# Patient Record
Sex: Female | Born: 1945 | Race: White | Hispanic: No | Marital: Married | Smoking: Never smoker
Health system: Southern US, Community
[De-identification: ages and names within clinical notes are randomized; demographics above are authoritative.]

## PROBLEM LIST (undated history)

## (undated) DIAGNOSIS — N200 Calculus of kidney: Secondary | ICD-10-CM

## (undated) DIAGNOSIS — G5600 Carpal tunnel syndrome, unspecified upper limb: Secondary | ICD-10-CM

## (undated) DIAGNOSIS — N281 Cyst of kidney, acquired: Secondary | ICD-10-CM

## (undated) DIAGNOSIS — K635 Polyp of colon: Secondary | ICD-10-CM

## (undated) DIAGNOSIS — I10 Essential (primary) hypertension: Secondary | ICD-10-CM

## (undated) HISTORY — DX: Essential (primary) hypertension: I10

## (undated) HISTORY — PX: UPPER GASTROINTESTINAL ENDOSCOPY: SHX188

## (undated) HISTORY — DX: Carpal tunnel syndrome, unspecified upper limb: G56.00

## (undated) HISTORY — DX: Calculus of kidney: N20.0

## (undated) HISTORY — DX: Polyp of colon: K63.5

## (undated) HISTORY — DX: Cyst of kidney, acquired: N28.1

## (undated) HISTORY — PX: KNEE ARTHROSCOPY: SHX127

---
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
## 1994-10-31 HISTORY — PX: HERNIA REPAIR: SHX51

## 2009-04-19 ENCOUNTER — Ambulatory Visit: Payer: Self-pay | Admitting: Family Medicine

## 2010-10-23 ENCOUNTER — Ambulatory Visit
Admission: RE | Admit: 2010-10-23 | Discharge: 2010-10-23 | Payer: Self-pay | Source: Home / Self Care | Admitting: Emergency Medicine

## 2010-12-02 NOTE — Assessment & Plan Note (Signed)
Summary: COLD/TM   Vital Signs:  Patient Profile:   65 Years Old Female CC:      productive cough, congestion x 4 days Height:     62 inches Weight:      163 pounds O2 Sat:      98 % O2 treatment:    Room Air Temp:     98.7 degrees F oral Pulse rate:   70 / minute Resp:     14 per minute BP sitting:   135 / 77  (left arm) Cuff size:   regular  Pt. in pain?   no  Vitals Entered By: Lajean Saver RN (October 23, 2010 11:36 AM)                   Updated Prior Medication List: * VITAMIN D once a day * CALCIUM as directed * AMBIEN as needed NEXIUM 20 MG CPDR (ESOMEPRAZOLE MAGNESIUM) prn  Current Allergies: No known allergies History of Present Illness History from: patient Chief Complaint: productive cough, congestion x 4 days History of Present Illness: 65 Years Old Female complains of onset of cold symptoms for 7 days.  Jabree has been using Deslym which is helping a little bit.  She is concerned because she has a breast reduction surgery planned on Jan 3rd and wants to be better by then.  Her husband has been sick for 10 days now.   No sore throat + cough No pleuritic pain No wheezing No nasal congestion No post-nasal drainage + sinus pain/pressure + hoarseness No chest congestion No itchy/red eyes No earache No hemoptysis No SOB No chills/sweats No fever No nausea No vomiting No abdominal pain No diarrhea No skin rashes No fatigue No myalgias No headache   REVIEW OF SYSTEMS Constitutional Symptoms      Denies fever, chills, night sweats, weight loss, weight gain, and fatigue.  Eyes       Denies change in vision, eye pain, eye discharge, glasses, contact lenses, and eye surgery. Ear/Nose/Throat/Mouth       Complains of frequent runny nose, sinus problems, and hoarseness.      Denies hearing loss/aids, change in hearing, ear pain, ear discharge, dizziness, frequent nose bleeds, sore throat, and tooth pain or bleeding.  Respiratory       Complains of  productive cough.      Denies dry cough, wheezing, shortness of breath, asthma, bronchitis, and emphysema/COPD.  Cardiovascular       Denies murmurs, chest pain, and tires easily with exhertion.    Gastrointestinal       Denies stomach pain, nausea/vomiting, diarrhea, constipation, blood in bowel movements, and indigestion. Genitourniary       Denies painful urination, kidney stones, and loss of urinary control. Neurological       Denies paralysis, seizures, and fainting/blackouts. Musculoskeletal       Denies muscle pain, joint pain, joint stiffness, decreased range of motion, redness, swelling, muscle weakness, and gout.  Skin       Denies bruising, unusual mles/lumps or sores, and hair/skin or nail changes.  Psych       Denies mood changes, temper/anger issues, anxiety/stress, speech problems, depression, and sleep problems. Other Comments: x 4 days   Past History:  Past Medical History: Reflux  Past Surgical History: tonsillectomy hernia repair tummy tuck Breast reduction scheduled for next week  Family History: Reviewed history from 04/19/2009 and no changes required. mother deceased age 32 - CHF father deceased age 79 - heart attack brother alive  and healthy sister alive and healthy  Social History: denies drinking, smoking or recreational drug use Married Physical Exam General appearance: well developed, well nourished, no acute distress Ears: normal, no lesions or deformities Nasal: clear discharge Oral/Pharynx: pharyngeal erythema without exudate, uvula midline without deviation Neck: neck supple,  trachea midline, no masses Chest/Lungs: no rales, wheezes, or rhonchi bilateral, breath sounds equal without effort Heart: regular rate and  rhythm, no murmur Skin: no obvious rashes or lesions MSE: oriented to time, place, and person Assessment New Problems: UPPER RESPIRATORY INFECTION, ACUTE (ICD-465.9)   Patient Education: Patient and/or caregiver  instructed in the following: rest, fluids.  Plan New Medications/Changes: TESSALON 200 MG CAPS (BENZONATATE) 1 by mouth three times a day as needed cough  #18 x 0, 10/23/2010, Hoyt Koch MD ZITHROMAX Z-PAK 250 MG TABS (AZITHROMYCIN) use as directed  #1 x 0, 10/23/2010, Hoyt Koch MD  New Orders: Est. Patient Level III [04540] Pulse Oximetry [94760] Planning Comments:   1)  Take the prescribed antibiotic as instructed.  Inform your surgeon that you are currently sick, but I would assume you should be feeling better within the week. 2)  Use nasal saline solution (over the counter) at least 3 times a day. 3)  Use over the counter decongestants like Zyrtec-D every 12 hours as needed to help with congestion. 4)  Can take tylenol every 6 hours or motrin every 8 hours for pain or fever. 5)  Follow up with your primary doctor  if no improvement in 5-7 days, sooner if increasing pain, fever, or new symptoms.    The patient and/or caregiver has been counseled thoroughly with regard to medications prescribed including dosage, schedule, interactions, rationale for use, and possible side effects and they verbalize understanding.  Diagnoses and expected course of recovery discussed and will return if not improved as expected or if the condition worsens. Patient and/or caregiver verbalized understanding.  Prescriptions: TESSALON 200 MG CAPS (BENZONATATE) 1 by mouth three times a day as needed cough  #18 x 0   Entered and Authorized by:   Hoyt Koch MD   Signed by:   Hoyt Koch MD on 10/23/2010   Method used:   Printed then faxed to ...       [facility]  [address] [phone]* (retail)       [address]       [place], [place]  [address]       Ph: [phone]       Fax: [phone]   RxID:   [id number] ZITHROMAX Z-PAK 250 MG TABS (AZITHROMYCIN) use as directed  #1 x 0   Entered and Authorized by:   Hoyt Koch MD   Signed by:   Hoyt Koch MD on 10/23/2010   Method  used:   Printed then faxed to ...       [facility]  [address] [phone]* (retail)       [address]       [place], [place]  [address]       Ph: [phone]       Fax: [phone]   RxID:   [id number]   Orders Added: 1)  Est. Patient Level III [55732] 2)  Pulse Oximetry [20254]

## 2011-11-01 HISTORY — PX: BREAST REDUCTION SURGERY: SHX8

## 2012-11-15 DIAGNOSIS — L253 Unspecified contact dermatitis due to other chemical products: Secondary | ICD-10-CM | POA: Insufficient documentation

## 2013-04-20 ENCOUNTER — Emergency Department (INDEPENDENT_AMBULATORY_CARE_PROVIDER_SITE_OTHER): Payer: Medicare Other

## 2013-04-20 ENCOUNTER — Encounter: Payer: Self-pay | Admitting: Emergency Medicine

## 2013-04-20 ENCOUNTER — Emergency Department (INDEPENDENT_AMBULATORY_CARE_PROVIDER_SITE_OTHER)
Admission: EM | Admit: 2013-04-20 | Discharge: 2013-04-20 | Disposition: A | Discharge status: Home / Self Care | Payer: Medicare Other | Source: Home / Self Care

## 2013-04-20 DIAGNOSIS — M25473 Effusion, unspecified ankle: Secondary | ICD-10-CM

## 2013-04-20 DIAGNOSIS — S93409A Sprain of unspecified ligament of unspecified ankle, initial encounter: Secondary | ICD-10-CM

## 2013-04-20 DIAGNOSIS — X500XXA Overexertion from strenuous movement or load, initial encounter: Secondary | ICD-10-CM

## 2013-04-20 DIAGNOSIS — M25579 Pain in unspecified ankle and joints of unspecified foot: Secondary | ICD-10-CM

## 2013-04-20 MED ORDER — MELOXICAM 15 MG PO TABS: 15.0000 mg | ORAL_TABLET | Freq: Every day | ORAL | Status: DC

## 2013-04-20 NOTE — ED Provider Notes (Signed)
History     CSN: [id number]  Arrival date & time 04/20/13  1317   None     Chief Complaint  Patient presents with  . Ankle Pain   HPI  Ankle pain x 1 day.  Pt reports rolling her ankle earlier today.  Has had R ankle pain and swelling since this point.  Had some mild transient numbness/tingling that self resolved.  Has been able to bare weight, albeit with pain.   History reviewed. No pertinent past medical history.  Past Surgical History  Procedure Laterality Date  . Breast surgery    . Hernia repair    . Tonsillectomy    . Abdominal surgery      Family History  Problem Relation Age of Onset  . Heart failure Mother   . Heart failure Father   . Diabetes Brother     History  Substance Use Topics  . Smoking status: Never Smoker   . Smokeless tobacco: Not on file  . Alcohol Use: No    OB History   Grav Para Term Preterm Abortions TAB SAB Ect Mult Living                  Review of Systems  All other systems reviewed and are negative.    Allergies  Review of patient's allergies indicates no known allergies.  Home Medications   Current Outpatient Rx  Name  Route  Sig  Dispense  Refill  . zolpidem (AMBIEN) 5 MG tablet   Oral   Take 5 mg by mouth at bedtime as needed for sleep.           BP 143/79  Pulse 69  Temp(Src) 98.2 F (36.8 C) (Oral)  Resp 16  Ht 5' 2.5" (1.588 m)  Wt 150 lb (68.04 kg)  BMI 26.98 kg/m2  SpO2 96%  Physical Exam  Constitutional: She appears well-developed and well-nourished.  HENT:  Head: Normocephalic and atraumatic.  Eyes: Conjunctivae are normal. Pupils are equal, round, and reactive to light.  Neck: Normal range of motion.  Cardiovascular: Normal rate and regular rhythm.   Abdominal: Soft.  Musculoskeletal:       Feet:  Neurological: She is alert.    ED Course  Procedures (including critical care time)  Labs Reviewed - No data to display Dg Ankle Complete Right  04/20/2013   *RADIOLOGY REPORT*   Clinical Data: Ankle pain  RIGHT ANKLE - COMPLETE 3+ VIEW  Comparison: None  Findings: Mild lateral soft tissue swelling identified.  No displaced fractures or dislocations identified no radio-opaque foreign bodies or soft tissue calcifications identified.  Small plantar are and posterior calcaneal heel spurs noted.  IMPRESSION:  1.  Soft tissue swelling.   Original Report Authenticated By: Signa Kell, M.D.     1. Ankle sprain and strain, right, initial encounter       MDM  Xray negative for fracture.  RICE and NSAIDs.  Ankle brace.  Discussed general care and MSK red flags.  Follow up with sports medicine if not improved.     The patient and/or caregiver has been counseled thoroughly with regard to treatment plan and/or medications prescribed including dosage, schedule, interactions, rationale for use, and possible side effects and they verbalize understanding. Diagnoses and expected course of recovery discussed and will return if not improved as expected or if the condition worsens. Patient and/or caregiver verbalized understanding.             Doree Albee, MD 04/20/13 1434

## 2013-04-20 NOTE — ED Notes (Signed)
Reports falling and hurting right ankle at 10:30am today; took Aleve, elevated it and used ice.

## 2013-10-15 DIAGNOSIS — Z8249 Family history of ischemic heart disease and other diseases of the circulatory system: Secondary | ICD-10-CM | POA: Insufficient documentation

## 2013-10-15 DIAGNOSIS — F329 Major depressive disorder, single episode, unspecified: Secondary | ICD-10-CM | POA: Insufficient documentation

## 2013-10-15 DIAGNOSIS — F32A Depression, unspecified: Secondary | ICD-10-CM | POA: Insufficient documentation

## 2013-10-31 HISTORY — PX: KIDNEY STONE SURGERY: SHX686

## 2014-01-15 DIAGNOSIS — Z85828 Personal history of other malignant neoplasm of skin: Secondary | ICD-10-CM | POA: Insufficient documentation

## 2014-11-14 DIAGNOSIS — Z86010 Personal history of colonic polyps: Secondary | ICD-10-CM | POA: Diagnosis not present

## 2014-11-14 DIAGNOSIS — K219 Gastro-esophageal reflux disease without esophagitis: Secondary | ICD-10-CM | POA: Diagnosis not present

## 2014-11-26 DIAGNOSIS — K219 Gastro-esophageal reflux disease without esophagitis: Secondary | ICD-10-CM | POA: Diagnosis not present

## 2014-11-26 DIAGNOSIS — D123 Benign neoplasm of transverse colon: Secondary | ICD-10-CM | POA: Diagnosis not present

## 2014-11-26 DIAGNOSIS — D125 Benign neoplasm of sigmoid colon: Secondary | ICD-10-CM | POA: Diagnosis not present

## 2014-11-26 DIAGNOSIS — Z86010 Personal history of colonic polyps: Secondary | ICD-10-CM | POA: Diagnosis not present

## 2014-11-26 LAB — HM COLONOSCOPY

## 2014-12-01 DIAGNOSIS — N200 Calculus of kidney: Secondary | ICD-10-CM | POA: Diagnosis not present

## 2014-12-01 DIAGNOSIS — R350 Frequency of micturition: Secondary | ICD-10-CM | POA: Diagnosis not present

## 2014-12-09 DIAGNOSIS — L578 Other skin changes due to chronic exposure to nonionizing radiation: Secondary | ICD-10-CM | POA: Diagnosis not present

## 2014-12-09 DIAGNOSIS — B079 Viral wart, unspecified: Secondary | ICD-10-CM | POA: Diagnosis not present

## 2014-12-09 DIAGNOSIS — D485 Neoplasm of uncertain behavior of skin: Secondary | ICD-10-CM | POA: Diagnosis not present

## 2014-12-09 DIAGNOSIS — L821 Other seborrheic keratosis: Secondary | ICD-10-CM | POA: Diagnosis not present

## 2014-12-09 DIAGNOSIS — L249 Irritant contact dermatitis, unspecified cause: Secondary | ICD-10-CM | POA: Diagnosis not present

## 2014-12-09 DIAGNOSIS — Z85828 Personal history of other malignant neoplasm of skin: Secondary | ICD-10-CM | POA: Diagnosis not present

## 2014-12-09 DIAGNOSIS — L309 Dermatitis, unspecified: Secondary | ICD-10-CM | POA: Diagnosis not present

## 2014-12-09 DIAGNOSIS — L570 Actinic keratosis: Secondary | ICD-10-CM | POA: Diagnosis not present

## 2015-01-02 DIAGNOSIS — Z1231 Encounter for screening mammogram for malignant neoplasm of breast: Secondary | ICD-10-CM | POA: Diagnosis not present

## 2015-01-13 DIAGNOSIS — L299 Pruritus, unspecified: Secondary | ICD-10-CM | POA: Diagnosis not present

## 2015-01-13 DIAGNOSIS — F419 Anxiety disorder, unspecified: Secondary | ICD-10-CM | POA: Diagnosis not present

## 2015-01-13 DIAGNOSIS — K219 Gastro-esophageal reflux disease without esophagitis: Secondary | ICD-10-CM | POA: Diagnosis not present

## 2015-01-29 DIAGNOSIS — E559 Vitamin D deficiency, unspecified: Secondary | ICD-10-CM | POA: Diagnosis not present

## 2015-01-29 DIAGNOSIS — Z79899 Other long term (current) drug therapy: Secondary | ICD-10-CM | POA: Diagnosis not present

## 2015-01-29 DIAGNOSIS — E785 Hyperlipidemia, unspecified: Secondary | ICD-10-CM | POA: Diagnosis not present

## 2015-02-02 DIAGNOSIS — Z0000 Encounter for general adult medical examination without abnormal findings: Secondary | ICD-10-CM | POA: Diagnosis not present

## 2015-02-02 DIAGNOSIS — F329 Major depressive disorder, single episode, unspecified: Secondary | ICD-10-CM | POA: Diagnosis not present

## 2015-02-02 DIAGNOSIS — E785 Hyperlipidemia, unspecified: Secondary | ICD-10-CM | POA: Diagnosis not present

## 2015-02-02 DIAGNOSIS — G5601 Carpal tunnel syndrome, right upper limb: Secondary | ICD-10-CM | POA: Diagnosis not present

## 2015-02-02 DIAGNOSIS — E559 Vitamin D deficiency, unspecified: Secondary | ICD-10-CM | POA: Diagnosis not present

## 2015-02-02 DIAGNOSIS — Z23 Encounter for immunization: Secondary | ICD-10-CM | POA: Diagnosis not present

## 2015-02-02 DIAGNOSIS — F5101 Primary insomnia: Secondary | ICD-10-CM | POA: Diagnosis not present

## 2015-02-11 DIAGNOSIS — M8580 Other specified disorders of bone density and structure, unspecified site: Secondary | ICD-10-CM | POA: Diagnosis not present

## 2015-02-11 DIAGNOSIS — Z13820 Encounter for screening for osteoporosis: Secondary | ICD-10-CM | POA: Diagnosis not present

## 2015-02-11 LAB — HM DEXA SCAN

## 2015-02-23 DIAGNOSIS — K219 Gastro-esophageal reflux disease without esophagitis: Secondary | ICD-10-CM | POA: Diagnosis not present

## 2015-02-23 DIAGNOSIS — Z86010 Personal history of colonic polyps: Secondary | ICD-10-CM | POA: Diagnosis not present

## 2015-06-10 DIAGNOSIS — M79641 Pain in right hand: Secondary | ICD-10-CM | POA: Diagnosis not present

## 2015-06-10 DIAGNOSIS — G5601 Carpal tunnel syndrome, right upper limb: Secondary | ICD-10-CM | POA: Diagnosis not present

## 2015-07-02 DIAGNOSIS — Z79899 Other long term (current) drug therapy: Secondary | ICD-10-CM | POA: Diagnosis not present

## 2015-07-02 DIAGNOSIS — K219 Gastro-esophageal reflux disease without esophagitis: Secondary | ICD-10-CM | POA: Diagnosis not present

## 2015-07-02 DIAGNOSIS — G5601 Carpal tunnel syndrome, right upper limb: Secondary | ICD-10-CM | POA: Diagnosis not present

## 2015-07-02 DIAGNOSIS — M65311 Trigger thumb, right thumb: Secondary | ICD-10-CM | POA: Diagnosis not present

## 2015-07-02 HISTORY — PX: TRIGGER FINGER RELEASE: SHX641

## 2015-07-15 DIAGNOSIS — F419 Anxiety disorder, unspecified: Secondary | ICD-10-CM

## 2015-07-15 DIAGNOSIS — K219 Gastro-esophageal reflux disease without esophagitis: Secondary | ICD-10-CM | POA: Diagnosis not present

## 2015-07-15 DIAGNOSIS — F329 Major depressive disorder, single episode, unspecified: Secondary | ICD-10-CM | POA: Diagnosis not present

## 2015-07-15 DIAGNOSIS — Z79899 Other long term (current) drug therapy: Secondary | ICD-10-CM | POA: Diagnosis not present

## 2015-07-15 DIAGNOSIS — Z23 Encounter for immunization: Secondary | ICD-10-CM | POA: Diagnosis not present

## 2015-07-15 DIAGNOSIS — F418 Other specified anxiety disorders: Secondary | ICD-10-CM | POA: Insufficient documentation

## 2015-07-15 DIAGNOSIS — F3341 Major depressive disorder, recurrent, in partial remission: Secondary | ICD-10-CM | POA: Insufficient documentation

## 2015-07-15 LAB — BASIC METABOLIC PANEL
BUN: 11 mg/dL (ref 4–21)
CREATININE: 0.7 mg/dL (ref 0.5–1.1)
GLUCOSE: 84 mg/dL
POTASSIUM: 4.1 mmol/L (ref 3.4–5.3)
Sodium: 141 mmol/L (ref 137–147)

## 2015-07-15 LAB — HEPATIC FUNCTION PANEL
ALT: 8 U/L (ref 7–35)
AST: 13 U/L (ref 13–35)
Alkaline Phosphatase: 40 U/L (ref 25–125)
Bilirubin, Total: 0.7 mg/dL

## 2015-08-14 DIAGNOSIS — L309 Dermatitis, unspecified: Secondary | ICD-10-CM | POA: Diagnosis not present

## 2015-08-14 DIAGNOSIS — Z483 Aftercare following surgery for neoplasm: Secondary | ICD-10-CM | POA: Diagnosis not present

## 2015-08-14 DIAGNOSIS — Z85828 Personal history of other malignant neoplasm of skin: Secondary | ICD-10-CM | POA: Diagnosis not present

## 2015-08-14 DIAGNOSIS — C44719 Basal cell carcinoma of skin of left lower limb, including hip: Secondary | ICD-10-CM | POA: Diagnosis not present

## 2015-08-14 DIAGNOSIS — L299 Pruritus, unspecified: Secondary | ICD-10-CM | POA: Diagnosis not present

## 2015-08-14 DIAGNOSIS — Z872 Personal history of diseases of the skin and subcutaneous tissue: Secondary | ICD-10-CM | POA: Diagnosis not present

## 2015-09-07 DIAGNOSIS — H43813 Vitreous degeneration, bilateral: Secondary | ICD-10-CM | POA: Diagnosis not present

## 2015-09-07 DIAGNOSIS — H527 Unspecified disorder of refraction: Secondary | ICD-10-CM | POA: Diagnosis not present

## 2015-09-07 DIAGNOSIS — H25813 Combined forms of age-related cataract, bilateral: Secondary | ICD-10-CM | POA: Diagnosis not present

## 2015-10-05 DIAGNOSIS — M9903 Segmental and somatic dysfunction of lumbar region: Secondary | ICD-10-CM | POA: Diagnosis not present

## 2015-10-05 DIAGNOSIS — J069 Acute upper respiratory infection, unspecified: Secondary | ICD-10-CM | POA: Diagnosis not present

## 2015-10-05 DIAGNOSIS — M5431 Sciatica, right side: Secondary | ICD-10-CM | POA: Diagnosis not present

## 2015-10-05 DIAGNOSIS — S39012A Strain of muscle, fascia and tendon of lower back, initial encounter: Secondary | ICD-10-CM | POA: Diagnosis not present

## 2015-10-05 DIAGNOSIS — M9904 Segmental and somatic dysfunction of sacral region: Secondary | ICD-10-CM | POA: Diagnosis not present

## 2015-10-06 DIAGNOSIS — M9903 Segmental and somatic dysfunction of lumbar region: Secondary | ICD-10-CM | POA: Diagnosis not present

## 2015-10-06 DIAGNOSIS — M9904 Segmental and somatic dysfunction of sacral region: Secondary | ICD-10-CM | POA: Diagnosis not present

## 2015-10-06 DIAGNOSIS — M5431 Sciatica, right side: Secondary | ICD-10-CM | POA: Diagnosis not present

## 2015-10-07 DIAGNOSIS — M9904 Segmental and somatic dysfunction of sacral region: Secondary | ICD-10-CM | POA: Diagnosis not present

## 2015-10-07 DIAGNOSIS — M5431 Sciatica, right side: Secondary | ICD-10-CM | POA: Diagnosis not present

## 2015-10-07 DIAGNOSIS — M9903 Segmental and somatic dysfunction of lumbar region: Secondary | ICD-10-CM | POA: Diagnosis not present

## 2015-10-08 DIAGNOSIS — M5431 Sciatica, right side: Secondary | ICD-10-CM | POA: Diagnosis not present

## 2015-10-08 DIAGNOSIS — M9904 Segmental and somatic dysfunction of sacral region: Secondary | ICD-10-CM | POA: Diagnosis not present

## 2015-10-08 DIAGNOSIS — M9903 Segmental and somatic dysfunction of lumbar region: Secondary | ICD-10-CM | POA: Diagnosis not present

## 2015-10-15 DIAGNOSIS — R262 Difficulty in walking, not elsewhere classified: Secondary | ICD-10-CM | POA: Diagnosis not present

## 2015-10-15 DIAGNOSIS — M6281 Muscle weakness (generalized): Secondary | ICD-10-CM | POA: Diagnosis not present

## 2015-10-15 DIAGNOSIS — M545 Low back pain: Secondary | ICD-10-CM | POA: Diagnosis not present

## 2015-10-19 DIAGNOSIS — M9903 Segmental and somatic dysfunction of lumbar region: Secondary | ICD-10-CM | POA: Diagnosis not present

## 2015-10-19 DIAGNOSIS — M5431 Sciatica, right side: Secondary | ICD-10-CM | POA: Diagnosis not present

## 2015-10-19 DIAGNOSIS — M9904 Segmental and somatic dysfunction of sacral region: Secondary | ICD-10-CM | POA: Diagnosis not present

## 2015-10-20 DIAGNOSIS — M9903 Segmental and somatic dysfunction of lumbar region: Secondary | ICD-10-CM | POA: Diagnosis not present

## 2015-10-20 DIAGNOSIS — M545 Low back pain: Secondary | ICD-10-CM | POA: Diagnosis not present

## 2015-10-20 DIAGNOSIS — M9904 Segmental and somatic dysfunction of sacral region: Secondary | ICD-10-CM | POA: Diagnosis not present

## 2015-10-20 DIAGNOSIS — M5431 Sciatica, right side: Secondary | ICD-10-CM | POA: Diagnosis not present

## 2015-10-20 DIAGNOSIS — M5416 Radiculopathy, lumbar region: Secondary | ICD-10-CM | POA: Diagnosis not present

## 2015-10-21 DIAGNOSIS — M5136 Other intervertebral disc degeneration, lumbar region: Secondary | ICD-10-CM | POA: Diagnosis not present

## 2015-10-21 DIAGNOSIS — M545 Low back pain: Secondary | ICD-10-CM | POA: Diagnosis not present

## 2015-10-21 DIAGNOSIS — M6281 Muscle weakness (generalized): Secondary | ICD-10-CM | POA: Diagnosis not present

## 2015-10-21 DIAGNOSIS — R262 Difficulty in walking, not elsewhere classified: Secondary | ICD-10-CM | POA: Diagnosis not present

## 2015-10-21 DIAGNOSIS — M47816 Spondylosis without myelopathy or radiculopathy, lumbar region: Secondary | ICD-10-CM | POA: Diagnosis not present

## 2015-10-22 DIAGNOSIS — M9903 Segmental and somatic dysfunction of lumbar region: Secondary | ICD-10-CM | POA: Diagnosis not present

## 2015-10-22 DIAGNOSIS — M5431 Sciatica, right side: Secondary | ICD-10-CM | POA: Diagnosis not present

## 2015-10-22 DIAGNOSIS — M9904 Segmental and somatic dysfunction of sacral region: Secondary | ICD-10-CM | POA: Diagnosis not present

## 2015-10-28 DIAGNOSIS — M6281 Muscle weakness (generalized): Secondary | ICD-10-CM | POA: Diagnosis not present

## 2015-10-28 DIAGNOSIS — R262 Difficulty in walking, not elsewhere classified: Secondary | ICD-10-CM | POA: Diagnosis not present

## 2015-10-28 DIAGNOSIS — M545 Low back pain: Secondary | ICD-10-CM | POA: Diagnosis not present

## 2015-10-29 DIAGNOSIS — M5416 Radiculopathy, lumbar region: Secondary | ICD-10-CM | POA: Diagnosis not present

## 2015-11-19 DIAGNOSIS — M5416 Radiculopathy, lumbar region: Secondary | ICD-10-CM | POA: Diagnosis not present

## 2015-11-19 DIAGNOSIS — M545 Low back pain: Secondary | ICD-10-CM | POA: Diagnosis not present

## 2015-12-07 DIAGNOSIS — R29898 Other symptoms and signs involving the musculoskeletal system: Secondary | ICD-10-CM | POA: Diagnosis not present

## 2015-12-07 DIAGNOSIS — M545 Low back pain: Secondary | ICD-10-CM | POA: Diagnosis not present

## 2015-12-07 DIAGNOSIS — R531 Weakness: Secondary | ICD-10-CM | POA: Diagnosis not present

## 2015-12-15 DIAGNOSIS — M545 Low back pain: Secondary | ICD-10-CM | POA: Diagnosis not present

## 2015-12-22 DIAGNOSIS — M5416 Radiculopathy, lumbar region: Secondary | ICD-10-CM | POA: Diagnosis not present

## 2015-12-22 DIAGNOSIS — M545 Low back pain: Secondary | ICD-10-CM | POA: Diagnosis not present

## 2015-12-24 DIAGNOSIS — M5416 Radiculopathy, lumbar region: Secondary | ICD-10-CM | POA: Diagnosis not present

## 2015-12-24 DIAGNOSIS — M545 Low back pain: Secondary | ICD-10-CM | POA: Diagnosis not present

## 2015-12-29 DIAGNOSIS — M5416 Radiculopathy, lumbar region: Secondary | ICD-10-CM | POA: Diagnosis not present

## 2015-12-29 DIAGNOSIS — M545 Low back pain: Secondary | ICD-10-CM | POA: Diagnosis not present

## 2015-12-31 DIAGNOSIS — H43811 Vitreous degeneration, right eye: Secondary | ICD-10-CM | POA: Diagnosis not present

## 2016-01-07 DIAGNOSIS — R29898 Other symptoms and signs involving the musculoskeletal system: Secondary | ICD-10-CM | POA: Diagnosis not present

## 2016-01-07 DIAGNOSIS — M5416 Radiculopathy, lumbar region: Secondary | ICD-10-CM | POA: Diagnosis not present

## 2016-01-26 DIAGNOSIS — R6889 Other general symptoms and signs: Secondary | ICD-10-CM | POA: Diagnosis not present

## 2016-02-02 DIAGNOSIS — L308 Other specified dermatitis: Secondary | ICD-10-CM | POA: Diagnosis not present

## 2016-02-02 DIAGNOSIS — Z0000 Encounter for general adult medical examination without abnormal findings: Secondary | ICD-10-CM | POA: Diagnosis not present

## 2016-02-02 DIAGNOSIS — L923 Foreign body granuloma of the skin and subcutaneous tissue: Secondary | ICD-10-CM | POA: Diagnosis not present

## 2016-02-02 DIAGNOSIS — Z8249 Family history of ischemic heart disease and other diseases of the circulatory system: Secondary | ICD-10-CM | POA: Diagnosis not present

## 2016-02-02 DIAGNOSIS — L570 Actinic keratosis: Secondary | ICD-10-CM | POA: Diagnosis not present

## 2016-02-02 DIAGNOSIS — L820 Inflamed seborrheic keratosis: Secondary | ICD-10-CM | POA: Diagnosis not present

## 2016-02-02 DIAGNOSIS — Z1231 Encounter for screening mammogram for malignant neoplasm of breast: Secondary | ICD-10-CM | POA: Diagnosis not present

## 2016-02-02 DIAGNOSIS — R5383 Other fatigue: Secondary | ICD-10-CM | POA: Diagnosis not present

## 2016-02-02 DIAGNOSIS — E538 Deficiency of other specified B group vitamins: Secondary | ICD-10-CM | POA: Diagnosis not present

## 2016-02-02 DIAGNOSIS — G479 Sleep disorder, unspecified: Secondary | ICD-10-CM | POA: Diagnosis not present

## 2016-02-02 DIAGNOSIS — Z85820 Personal history of malignant melanoma of skin: Secondary | ICD-10-CM | POA: Diagnosis not present

## 2016-02-02 DIAGNOSIS — L821 Other seborrheic keratosis: Secondary | ICD-10-CM | POA: Diagnosis not present

## 2016-02-02 DIAGNOSIS — D71 Functional disorders of polymorphonuclear neutrophils: Secondary | ICD-10-CM | POA: Diagnosis not present

## 2016-02-02 DIAGNOSIS — L601 Onycholysis: Secondary | ICD-10-CM | POA: Diagnosis not present

## 2016-02-02 DIAGNOSIS — E559 Vitamin D deficiency, unspecified: Secondary | ICD-10-CM | POA: Diagnosis not present

## 2016-02-02 LAB — TSH: TSH: 0.64 u[IU]/mL (ref 0.41–5.90)

## 2016-02-02 LAB — HEPATIC FUNCTION PANEL
ALK PHOS: 40 U/L (ref 25–125)
ALT: 8 U/L (ref 7–35)
AST: 13 U/L (ref 13–35)
BILIRUBIN, TOTAL: 0.7 mg/dL

## 2016-02-02 LAB — CBC AND DIFFERENTIAL
HCT: 38 % (ref 36–46)
HEMOGLOBIN: 13.1 g/dL (ref 12.0–16.0)
NEUTROS ABS: 3 /uL
PLATELETS: 217 10*3/uL (ref 150–399)
WBC: 6.2 10^3/mL

## 2016-02-02 LAB — BASIC METABOLIC PANEL
BUN: 11 mg/dL (ref 4–21)
CREATININE: 0.7 mg/dL (ref 0.5–1.1)
Glucose: 84 mg/dL
Potassium: 4.1 mmol/L (ref 3.4–5.3)
SODIUM: 141 mmol/L (ref 137–147)

## 2016-02-02 LAB — VITAMIN B12: VITAMIN B12: 387

## 2016-02-02 LAB — VITAMIN D 25 HYDROXY (VIT D DEFICIENCY, FRACTURES): Vit D, 25-Hydroxy: 27.6

## 2016-02-10 DIAGNOSIS — Z1231 Encounter for screening mammogram for malignant neoplasm of breast: Secondary | ICD-10-CM | POA: Diagnosis not present

## 2016-02-10 LAB — HM MAMMOGRAPHY

## 2016-02-12 DIAGNOSIS — N200 Calculus of kidney: Secondary | ICD-10-CM | POA: Diagnosis not present

## 2016-02-12 DIAGNOSIS — I719 Aortic aneurysm of unspecified site, without rupture: Secondary | ICD-10-CM | POA: Diagnosis not present

## 2016-02-12 DIAGNOSIS — Z8249 Family history of ischemic heart disease and other diseases of the circulatory system: Secondary | ICD-10-CM | POA: Diagnosis not present

## 2016-02-15 ENCOUNTER — Ambulatory Visit (INDEPENDENT_AMBULATORY_CARE_PROVIDER_SITE_OTHER): Payer: Medicare Other | Admitting: Family Medicine

## 2016-02-15 ENCOUNTER — Encounter: Payer: Self-pay | Admitting: Family Medicine

## 2016-02-15 VITALS — BP 130/80 | HR 68 | Wt 174.0 lb

## 2016-02-15 DIAGNOSIS — M25561 Pain in right knee: Secondary | ICD-10-CM

## 2016-02-15 DIAGNOSIS — K219 Gastro-esophageal reflux disease without esophagitis: Secondary | ICD-10-CM | POA: Insufficient documentation

## 2016-02-15 DIAGNOSIS — N281 Cyst of kidney, acquired: Secondary | ICD-10-CM

## 2016-02-15 DIAGNOSIS — E559 Vitamin D deficiency, unspecified: Secondary | ICD-10-CM | POA: Diagnosis not present

## 2016-02-15 DIAGNOSIS — M5432 Sciatica, left side: Secondary | ICD-10-CM

## 2016-02-15 DIAGNOSIS — M545 Low back pain: Secondary | ICD-10-CM | POA: Diagnosis not present

## 2016-02-15 DIAGNOSIS — M7062 Trochanteric bursitis, left hip: Secondary | ICD-10-CM

## 2016-02-15 DIAGNOSIS — M7061 Trochanteric bursitis, right hip: Secondary | ICD-10-CM

## 2016-02-15 DIAGNOSIS — N952 Postmenopausal atrophic vaginitis: Secondary | ICD-10-CM | POA: Insufficient documentation

## 2016-02-15 DIAGNOSIS — Q6100 Congenital renal cyst, unspecified: Secondary | ICD-10-CM | POA: Diagnosis not present

## 2016-02-15 DIAGNOSIS — M25562 Pain in left knee: Secondary | ICD-10-CM | POA: Diagnosis not present

## 2016-02-15 DIAGNOSIS — R29898 Other symptoms and signs involving the musculoskeletal system: Secondary | ICD-10-CM | POA: Diagnosis not present

## 2016-02-15 DIAGNOSIS — G2581 Restless legs syndrome: Secondary | ICD-10-CM | POA: Insufficient documentation

## 2016-02-15 MED ORDER — ALPRAZOLAM 0.5 MG PO TABS: 0.2500 mg | ORAL_TABLET | Freq: Every day | ORAL | Status: DC | PRN

## 2016-02-15 NOTE — Progress Notes (Signed)
Subjective:    Patient ID: Jasmine Fitzgerald, female    DOB: 12/17/1945, 70 y.o.   MRN: [id number]  HPI She is here today to establish care. She is a family member of one of my former patients, Brandon Melnick.  She was seeing Dr. Graylon Good for awhile.  She had a medicare wellness Exam in the last 2 weeks.  She c/o of pain in both her hips and restless leg.   She's had bilateral hip pain for several months. In fact she had an injection in her left hip about 2 or 3 years ago for bursitis and she feels like it's the same symptoms she had previously. It's painful to sleep on her side. She also notices that if she gets out of the car after sitting for long period of time they feel sore and stiff. She denies any trauma or injury. She has recently been doing physical therapy for sciatica down her left leg. She was seen about a month ago and had an MRI and was referred to PT. She did have an injection done as well back in December and then again in January for her lumbar spine. I will need to get her records for further detail.  Insomnia-she's producing on trazodone 50 mg at bedtime as needed but also takes a half a Xanax as well. She recently started on gabapentin for the sciatica in because of its sedative effects she's actually been skipping her trazodone. She still doesn't sleep well most nights and feels like she is restless.  She also brought in a copy of blood work that was done at her wellness exam about 6 months ago and then some repeat labs about 2 weeks ago. Her vitamin D was low. She was started on a daily supplement of 5000 international units. Next  She also reports a history of a kidney cyst is followed by Dr. Kathyrn Lass over the last 5 years. In fact she was released this past year since it had not changed.    Review of Systems  Constitutional: Negative for fever, diaphoresis and unexpected weight change.  HENT: Negative for hearing loss, rhinorrhea and tinnitus.   Eyes: Positive for visual  disturbance.  Respiratory: Negative for cough and wheezing.   Cardiovascular: Negative for chest pain and palpitations.  Gastrointestinal: Negative for nausea, vomiting, diarrhea and blood in stool.  Genitourinary: Negative for vaginal bleeding, vaginal discharge and difficulty urinating.  Musculoskeletal: Positive for myalgias. Negative for arthralgias.  Skin: Negative for rash.  Neurological: Negative for headaches.  Hematological: Negative for adenopathy. Does not bruise/bleed easily.  Psychiatric/Behavioral: Negative for sleep disturbance and dysphoric mood. The patient is not nervous/anxious.      BP 130/80 mmHg  Pulse 68  Wt 174 lb (78.926 kg)  SpO2 96%    Allergies  Allergen Reactions  . Codeine Nausea And Vomiting and Other (See Comments)    GI Upset  . Hydrocodone Nausea And Vomiting    Past Medical History  Diagnosis Date  . Renal cyst     benign  . Kidney stones   . Carpal tunnel syndrome     Past Surgical History  Procedure Laterality Date  . Breast reduction surgery  2013  . Hernia repair  1996  . Tonsillectomy  1957  . Abdominal surgery    . Trigger finger release  07/2015  . Knee surgery    . Kidney stone surgery  2015    Social History   Social History  . Marital Status: Married  Spouse Name: N/A  . Number of Children: N/A  . Years of Education: N/A   Occupational History  . Not on file.   Social History Main Topics  . Smoking status: Never Smoker   . Smokeless tobacco: Not on file  . Alcohol Use: No  . Drug Use: No  . Sexual Activity: Not Currently   Other Topics Concern  . Not on file   Social History Narrative   Retired. High school degree. Married to [place]. 3 adult children. Currently lives with her spouse.    Family History  Problem Relation Age of Onset  . Heart failure Mother   . Heart failure Father   . Diabetes Brother   . Cancer Maternal Aunt   . Cancer Maternal Uncle   . Ovarian cancer    . Lung cancer    .  Throat cancer    . Stroke Maternal Grandmother   . Heart attack Father     Outpatient Encounter Prescriptions as of 02/15/2016  Medication Sig  . ALPRAZolam (XANAX) 0.5 MG tablet Take 0.5 tablets (0.25 mg total) by mouth daily as needed for anxiety.  . Cholecalciferol (VITAMIN D3) 5000 units CAPS Take 1 capsule by mouth daily.  Marland Kitchen desvenlafaxine (PRISTIQ) 50 MG 24 hr tablet   . gabapentin (NEURONTIN) 300 MG capsule Take 300 mg by mouth at bedtime.  Marland Kitchen loratadine (CLARITIN) 10 MG tablet Take 10 mg by mouth daily.  . meloxicam (MOBIC) 15 MG tablet Take 1 tablet (15 mg total) by mouth daily.  Marland Kitchen omeprazole (PRILOSEC) 20 MG capsule   . traZODone (DESYREL) 50 MG tablet Take 50 mg by mouth at bedtime as needed for sleep.  Marland Kitchen VITAMIN K PO Take 1 capsule by mouth daily.  . [DISCONTINUED] ALPRAZolam (XANAX) 0.5 MG tablet   . [DISCONTINUED] zolpidem (AMBIEN) 5 MG tablet Take 5 mg by mouth at bedtime as needed for sleep.   No facility-administered encounter medications on file as of 02/15/2016.          Objective:   Physical Exam  Constitutional: She is oriented to person, place, and time. She appears well-developed and well-nourished.  HENT:  Head: Normocephalic and atraumatic.  Cardiovascular: Normal rate, regular rhythm and normal heart sounds.   Pulmonary/Chest: Effort normal and breath sounds normal.  Neurological: She is alert and oriented to person, place, and time.  Skin: Skin is warm and dry.  Psychiatric: She has a normal mood and affect. Her behavior is normal.          Assessment & Plan:  Insomnia - Repeat Joneen Boers refill her alprazolam for now. Will address at future visit again.  Bilateral bursitis-discussed treatment options including injections and physical therapy. She preferred to do bilateral injections today and also start some home physical therapy. Patient tolerated procedure well. Next  Vitamin D deficiency-we'll recheck her vitamin D level in about 3 months.  Next  Renal cyst-she wasn't sure if it was right or left kidney. But will update this as soon as I get her records. Released.    Sicatica, right - doing PT and on gabapentin.    Depression/anxiety-PHQ 9 score of 8 today and dad 7 score of 5. She rates her symptoms as not difficult. Continue with Pristiq. Continue with as needed alprazolam.  Aspiration/Injection Procedure Note MALAIA COLEE [id number] [birth year]  Procedure: Injection Indications: pain  Of bilateral trochanteric bursa  Procedure Details Consent: Risks of procedure as well as the alternatives and risks of each were explained to  the (patient/caregiver).  Consent for procedure obtained. Time Out: Verified patient identification, verified procedure, site/side was marked, verified correct patient position, special equipment/implants available, medications/allergies/relevent history reviewed, required imaging and test results available.  Performed    Area prepped with Hibiclens.   Local Anesthesia Used:Ethyl Chloride Spray Amount of Fluid Aspirated: none Injected 1 ml 40mg  kenalog with 9 cc of lidocaine 1% without epi.   A sterile dressing was applied.  Patient did tolerate procedure well. Estimated blood loss: 0  METHENEY,CATHERINE 02/15/2016, 1:56 PM

## 2016-02-18 ENCOUNTER — Encounter: Payer: Self-pay | Admitting: Family Medicine

## 2016-02-26 ENCOUNTER — Encounter: Payer: Self-pay | Admitting: Family Medicine

## 2016-03-11 ENCOUNTER — Encounter: Payer: Self-pay | Admitting: Family Medicine

## 2016-03-23 DIAGNOSIS — K219 Gastro-esophageal reflux disease without esophagitis: Secondary | ICD-10-CM | POA: Diagnosis not present

## 2016-03-23 DIAGNOSIS — Z86010 Personal history of colonic polyps: Secondary | ICD-10-CM | POA: Diagnosis not present

## 2016-03-24 ENCOUNTER — Encounter: Payer: Self-pay | Admitting: Family Medicine

## 2016-04-06 DIAGNOSIS — H43811 Vitreous degeneration, right eye: Secondary | ICD-10-CM | POA: Diagnosis not present

## 2016-04-06 LAB — HM DIABETES EYE EXAM

## 2016-04-22 ENCOUNTER — Encounter: Payer: Self-pay | Admitting: Family Medicine

## 2016-05-02 DIAGNOSIS — M5416 Radiculopathy, lumbar region: Secondary | ICD-10-CM | POA: Diagnosis not present

## 2016-05-10 DIAGNOSIS — M545 Low back pain: Secondary | ICD-10-CM | POA: Diagnosis not present

## 2016-05-10 DIAGNOSIS — M5416 Radiculopathy, lumbar region: Secondary | ICD-10-CM | POA: Diagnosis not present

## 2016-05-20 ENCOUNTER — Ambulatory Visit: Payer: Medicare Other | Admitting: Family Medicine

## 2016-05-24 ENCOUNTER — Ambulatory Visit (INDEPENDENT_AMBULATORY_CARE_PROVIDER_SITE_OTHER): Payer: Medicare Other

## 2016-05-24 ENCOUNTER — Encounter: Payer: Self-pay | Admitting: Family Medicine

## 2016-05-24 ENCOUNTER — Ambulatory Visit (INDEPENDENT_AMBULATORY_CARE_PROVIDER_SITE_OTHER): Payer: Medicare Other | Admitting: Family Medicine

## 2016-05-24 DIAGNOSIS — M5136 Other intervertebral disc degeneration, lumbar region: Secondary | ICD-10-CM | POA: Diagnosis not present

## 2016-05-24 DIAGNOSIS — M545 Low back pain: Secondary | ICD-10-CM

## 2016-05-24 DIAGNOSIS — M5416 Radiculopathy, lumbar region: Secondary | ICD-10-CM | POA: Diagnosis not present

## 2016-05-24 NOTE — Progress Notes (Signed)
Subjective:    CC: Back pain  HPI:  Back Pain she has been seen by Dr. Sherley Bounds, an orthopedist and had several injections. she wonders if she should be referred to neurology due to the type of pain that she is having. she has a f/u appt with Dr. Sherley Bounds in 2 wks     Patient comes in today just wanting some advice on what to do with her back. She said she started having some soreness in her back around the week of Thanksgiving last year. This was approximately 9 months ago. She then started to notice that the pain was actually radiating down to her left leg. It would get worse a with sitting and then would get better after getting up and walking for a little while. Her pain persisted so she finally contacted her provider at [facility]. They gave her a shot of prednisone a prescription for prednisone. This did not provide relief. She then decided to seek care from a chiropractor and went several visits without any relief. Then the week before Christmas she decided to see an orthopedist and saw Dr. Jacinto Reap AI at [facility] [place] in [place]. They then did an MRI on December 21. She received an injection on December 28 and got some relief. She then followed up and they recommended a repeat injection on January 19 and she got fantastic relief with that one. She also did physical therapy and was doing really well until about the third week of May when she actually fell and landed on her tailbone. She did not have any significant bruising at the time but did have more increased pain in the left buttock cheek area. It was painful to stand. She caught her orthopedist office back and they recommended a third injection that was done on July 3. Unfortunately she did not get any significant relief with this injection. She has a follow-up with him next week. They've tried gabapentin up to 4 pills a day as well as Cymbalta. She is not interested in narcotics for pain relief. She most of her pain is worse in the  morning when she first gets out of bed. In fact she'll have to sit up in a chair for almost an hour with her left leg propped up before she is able to move around and do things. She is now trying acupuncture and has tried it 3 times. She still continues to have pain in the left leg. When she puts pressure on the back of the left mid thigh, for example the edge of a chair. She will get pain that radiates around to the lateral portion of the lower leg and down to the outer part of the foot.  Past medical history, Surgical history, Family history not pertinant except as noted below, Social history, Allergies, and medications have been entered into the medical record, reviewed, and corrections made.   Review of Systems: No fevers, chills, night sweats, weight loss, chest pain, or shortness of breath.   Objective:    General: Well Developed, well nourished, and in no acute distress.  Neuro: Alert and oriented x3, extra-ocular muscles intact, sensation grossly intact.  HEENT: Normocephalic, atraumatic  Skin: Warm and dry, no rashes. Left leg: She does have a tender area over the left lateral thigh.   Impression and Recommendations:   Low back pain with left radiculopathy-I think at this point it would be helpful to get records to see where the injections were actually performed. She got significant  relief with the second injection so be interested to see at what level this was performed. She also did really well with physical therapy so strongly recommended getting her back into PT. She bought a TENS unit but just doesn't know how to use it so certainly she could take that with her to physical therapy and they can help her learn how to use it appropriately at think this would help provide some pain relief as well. It also might be worth getting a second opinion from either sports medicine or orthopedics depending on how she does after her physical therapy course. I don't think she is a candidate for any  surgical intervention at this point as the MRI in December did not show any nerve root compression. It did show some moderate degenerative disc disease and mild facet arthritis at L3-4 and L4-5. 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 She's not had any repeated x-rays since her fall so I think it's prudent to do that is to make sure there are no fractures etc.  Time spent 30 minutes, greater than 50% time spent counseling about back pain and treatment options.

## 2016-05-31 ENCOUNTER — Encounter: Payer: Self-pay | Admitting: Rehabilitative and Restorative Service Providers"

## 2016-05-31 ENCOUNTER — Ambulatory Visit (INDEPENDENT_AMBULATORY_CARE_PROVIDER_SITE_OTHER): Payer: Medicare Other | Admitting: Rehabilitative and Restorative Service Providers"

## 2016-05-31 DIAGNOSIS — M5442 Lumbago with sciatica, left side: Secondary | ICD-10-CM | POA: Diagnosis present

## 2016-05-31 DIAGNOSIS — R29898 Other symptoms and signs involving the musculoskeletal system: Secondary | ICD-10-CM

## 2016-05-31 NOTE — Patient Instructions (Signed)
Stretch out strap - [employer]  All ball 4 inch sport time [phone]  Self massage using ~4 inch rubber ball   HIP: Hamstrings - Supine   Place strap around foot. Raise leg up, keeping knee straight.  Bend opposite knee to protect back if indicated. Hold 30 seconds. 3 reps per set, 2-3 sets per day     Outer Hip Stretch: Reclined IT Band Stretch (Strap)   Strap around one foot, pull leg across body until you feel a pull or stretch, with shoulders on mat. Hold for 30 seconds. Repeat 3 times each leg. 2-3 times/day.  Piriformis Stretch   Lying on back, pull right knee toward opposite shoulder. Hold 30 seconds. Repeat 3 times. Do 2-3 sessions per day.   Sleeping on Back  Place pillow under knees. A pillow with cervical support and a roll around waist are also helpful. Copyright  VHI. All rights reserved.  Sleeping on Side Place pillow between knees. Use cervical support under neck and a roll around waist as needed. Copyright  VHI. All rights reserved.   Sleeping on Stomach   If this is the only desirable sleeping position, place pillow under lower legs, and under stomach or chest as needed.  Posture - Sitting   Sit upright, head facing forward. Try using a roll to support lower back. Keep shoulders relaxed, and avoid rounded back. Keep hips level with knees. Avoid crossing legs for long periods. Stand to Sit / Sit to Stand   To sit: Bend knees to lower self onto front edge of chair, then scoot back on seat. To stand: Reverse sequence by placing one foot forward, and scoot to front of seat. Use rocking motion to stand up.   Work Height and Reach  Ideal work height is no more than 2 to 4 inches below elbow level when standing, and at elbow level when sitting. Reaching should be limited to arm's length, with elbows slightly bent.  Bending  Bend at hips and knees, not back. Keep feet shoulder-width apart.    Posture - Standing   Good posture is important. Avoid  slouching and forward head thrust. Maintain curve in low back and align ears over shoul- ders, hips over ankles.  Alternating Positions   Alternate tasks and change positions frequently to reduce fatigue and muscle tension. Take rest breaks. Computer Work   Position work to Programmer, multimedia. Use proper work and seat height. Keep shoulders back and down, wrists straight, and elbows at right angles. Use chair that provides full back support. Add footrest and lumbar roll as needed.  Getting Into / Out of Car  Lower self onto seat, scoot back, then bring in one leg at a time. Reverse sequence to get out.  Dressing  Lie on back to pull socks or slacks over feet, or sit and bend leg while keeping back straight.    Housework - Sink  Place one foot on ledge of cabinet under sink when standing at sink for prolonged periods.   Pushing / Pulling  Pushing is preferable to pulling. Keep back in proper alignment, and use leg muscles to do the work.  Deep Squat   Squat and lift with both arms held against upper trunk. Tighten stomach muscles without holding breath. Use smooth movements to avoid jerking.  Avoid Twisting   Avoid twisting or bending back. Pivot around using foot movements, and bend at knees if needed when reaching for articles.  Carrying Luggage   Distribute weight evenly on both  sides. Use a cart whenever possible. Do not twist trunk. Move body as a unit.   Lifting Principles .Maintain proper posture and head alignment. .Slide object as close as possible before lifting. .Move obstacles out of the way. .Test before lifting; ask for help if too heavy. .Tighten stomach muscles without holding breath. .Use smooth movements; do not jerk. .Use legs to do the work, and pivot with feet. .Distribute the work load symmetrically and close to the center of trunk. .Push instead of pull whenever possible.   Ask For Help   Ask for help and delegate to others when possible.  Coordinate your movements when lifting together, and maintain the low back curve.  Log Roll   Lying on back, bend left knee and place left arm across chest. Roll all in one movement to the right. Reverse to roll to the left. Always move as one unit. Housework - Sweeping  Use long-handled equipment to avoid stooping.   Housework - Wiping  Position yourself as close as possible to reach work surface. Avoid straining your back.  Laundry - Unloading Wash   To unload small items at bottom of washer, lift leg opposite to arm being used to reach.  Tea close to area to be raked. Use arm movements to do the work. Keep back straight and avoid twisting.     Cart  When reaching into cart with one arm, lift opposite leg to keep back straight.   Getting Into / Out of Bed  Lower self to lie down on one side by raising legs and lowering head at the same time. Use arms to assist moving without twisting. Bend both knees to roll onto back if desired. To sit up, start from lying on side, and use same move-ments in reverse. Housework - Vacuuming  Hold the vacuum with arm held at side. Step back and forth to move it, keeping head up. Avoid twisting.   Laundry - IT consultant so that bending and twisting can be avoided.   Laundry - Unloading Dryer  Squat down to reach into clothes dryer or use a reacher.  Gardening - Weeding / Probation officer or Kneel. Knee pads may be helpful.

## 2016-05-31 NOTE — Therapy (Signed)
[place], [place], [address] Phone: [phone]   Fax:  [phone]  Physical Therapy Evaluation  Patient Details  Name: Jasmine Fitzgerald MRN: [id number] Date of Birth: Aug 25, 1946 Referring Provider: Dr. Madilyn Fireman  Encounter Date: 05/31/2016      PT End of Session - 05/31/16 1130    Visit Number 1   Number of Visits 12   Date for PT Re-Evaluation 07/12/16   PT Start Time 0845   PT Stop Time 0943   PT Time Calculation (min) 58 min   Activity Tolerance Patient tolerated treatment well      Past Medical History:  Diagnosis Date  . Carpal tunnel syndrome   . Colon polyp   . Kidney stones   . Renal cyst    benign    Past Surgical History:  Procedure Laterality Date  . ABDOMINAL SURGERY    . BREAST REDUCTION SURGERY  2013  . HERNIA REPAIR  1996  . KIDNEY STONE SURGERY  2015  . KNEE ARTHROSCOPY    . TONSILLECTOMY  1957  . TRIGGER FINGER RELEASE  07/2015  . UPPER GASTROINTESTINAL ENDOSCOPY      There were no vitals filed for this visit.       Subjective Assessment - 05/31/16 0851    Subjective Patient reports onset of LBP 11/16. She was treated with two injections and symptoms resolved. She had a fall 5/17 and onset of pain Lt back and hip 04/18/16 and symptoms have persisted since that time. Injection did not change symptoms and pain has continued since 6/17. Pain is worse in the morning and lying on side.    Pertinent History Lifting and pushing 11/16 when she had initial symptoms   How long can you sit comfortably? 10-15 min    How long can you stand comfortably? 15-30 min   How long can you walk comfortably? 10-30 min    Diagnostic tests MRI; Xrays   Patient Stated Goals get well - get rid of pain return to normal functional activities   Currently in Pain? Yes   Pain Score 8    Pain Location Back   Pain Orientation Lower;Left   Pain Descriptors / Indicators  Burning;Aching;Sharp;Throbbing   Pain Type Chronic pain   Pain Radiating Towards Lt buttocks into the hamstrings to posterior calf    Pain Onset More than a month ago   Pain Frequency Intermittent   Aggravating Factors  much wors when she gets out of bed in the mornings   Pain Relieving Factors tylenol; tramadol            [facility] PT Assessment - 05/31/16 0001      Assessment   Medical Diagnosis Lumbar radiculopathy    Referring Provider Dr. Madilyn Fireman   Onset Date/Surgical Date 04/18/16   Hand Dominance Right   Next MD Visit 06/13/16   Prior Therapy accupuncture - no improvement     Precautions   Precautions None     Balance Screen   Has the patient fallen in the past 6 months Yes  5/17 - landing on "tail"    How many times? 1   Has the patient had a decrease in activity level because of a fear of falling?  No   Is the patient reluctant to leave their home because of a fear of falling?  No     Home Environment   Additional Comments single level home - one step to enter - mild difficulty  with steps      Prior Function   Level of Independence Independent   Vocation Retired   Comptroller work - retired 2005   Leisure household chores; cooking; family activites; walks 30 -455 min PRN - intermittent pace      Observation/Other Assessments   Focus on Therapeutic Outcomes (FOTO)  54% limitation      Sensation   Additional Comments WFL's per pt report      Posture/Postural Control   Posture Comments flexed forward at hips in standing; head forward; shoulders rounded and elevated      AROM   Overall AROM Comments bilat hip ROM WFL's note tightness in hip extension    Lumbar Flexion 50%   Lumbar Extension 25%   Lumbar - Right Side Bend 40%   Lumbar - Left Side Bend 40%   Lumbar - Right Rotation 35%   Lumbar - Left Rotation 35%     Strength   Overall Strength Comments bilat LE strength grossly WNL's to testing - except Lt hip abd 4/5 and bilat hip ext  4+/5 -  some pain in the Lt hip with resistive testing      Flexibility   Hamstrings tight Rt ~80 deg; Lt 75 deg   Quadriceps tight bilat    ITB tight Lt > Rt   Piriformis tight Lt > Rt     Palpation   Spinal mobility pain with CPA mobs L2-L5; Lt UPA mobs L3-L5    Palpation comment significant tightness through the Lt lumbar paraspinals, hip abductors, piriformis, greater trochanter                   [facility] Adult PT Treatment/Exercise - 05/31/16 0001      Self-Care   Self-Care --  instructed in application; use; care of TESN unit      Therapeutic Activites    Therapeutic Activities --  myofacial ball release work     Lumbar Exercises: Stretches   Passive Hamstring Stretch 2 reps;30 seconds   ITB Stretch 2 reps;30 seconds   Piriformis Stretch 2 reps;30 seconds  Travell - varying angles x 3 positions      Moist Heat Therapy   Number Minutes Moist Heat 20 Minutes   Moist Heat Location Lumbar Spine;Hip  Lt     Acupuncturist Location Lr lumbar spine to Lt hip/buttocks   Electrical Stimulation Action IFC   Electrical Stimulation Parameters to tolerance   Electrical Stimulation Goals Pain;Tone                PT Education - 05/31/16 [phone]    Education provided Yes   Education Details HEP; myofacial ball release work; Games developer) Educated Patient   Methods Explanation;Demonstration;Tactile cues;Verbal cues;Handout   Comprehension Verbalized understanding;Returned demonstration;Verbal cues required;Tactile cues required             PT Long Term Goals - 05/31/16 1138      PT LONG TERM GOAL #1   Title Improve flexibility and ROM in lumbar spine and Lt LE 07/12/16   Time 6   Period Weeks   Status New     PT LONG TERM GOAL #2   Title Improve core strength and stability allowing patient to return to normal functional activities with minimal to no pain 07/12/16   Time 6   Period Weeks   Status New     PT  LONG TERM GOAL #3   Title  Patient independent in HEP 07/12/16   Time 6   Period Weeks   Status New     PT LONG TERM GOAL #4   Title Improve FOTO to </= 38% limitation 07/12/16   Time 6   Period Weeks   Status New               Plan - 05/31/16 1130    Clinical Impression Statement Jasmine Fitzgerald presents with ~6 week history of LBP and Lt hip/buttock/LE radicular pain following a fall 04/18/16. She has received spinal injection with no improvement. Patient has a history of LBP which resolved with two spinal injections in the fall of 2016. Current symptoms are more severe than previously experienced. Patient has pain in the Lt buttocks which radiates into the Lt posterior thigh and calf. Symtpoms are somewhat intermittent in nature but present on a daily basis. Patient presents with poor posture and alignment; limited trunk and LE mobilty and ROM; muscular tightness and pain with palpation; decreased strength and functional mobilty; pain with palpation; radicular pain Lt LE; limited functional activity level.    PT Frequency 2x / week   PT Duration 6 weeks   PT Treatment/Interventions Patient/family education;ADLs/Self Care Home Management;Cryotherapy;Electrical Stimulation;Iontophoresis 4mg /ml Dexamethasone;Moist Heat;Traction;Ultrasound;Manual techniques;Dry needling;Therapeutic activities;Therapeutic exercise   PT Next Visit Plan manual work v TDN for Lt hip/piriformis/glut medius; stretching; core stabilization; education; modalities as indicated   Consulted and Agree with Plan of Care Patient      Patient will benefit from skilled therapeutic intervention in order to improve the following deficits and impairments:  Postural dysfunction, Improper body mechanics, Decreased range of motion, Decreased mobility, Decreased strength, Increased fascial restricitons, Increased muscle spasms, Decreased activity tolerance, Pain  Visit Diagnosis: Lumbago with sciatica, left side - Plan: PT plan of care  cert/re-cert  Other symptoms and signs involving the musculoskeletal system - Plan: PT plan of care cert/re-cert     Problem List Patient Active Problem List   Diagnosis Date Noted  . Lumbar radiculopathy 05/24/2016  . Atrophic vaginitis 02/15/2016  . Reflux 02/15/2016  . Restless leg 02/15/2016  . Renal cyst 02/15/2016  . 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 Anxiety and depression 07/15/2015    [place] Naval Nilda Simmer PT, MPH  05/31/2016, 11:49 AM  [facility] [place], [place], [address] Phone: [phone]   Fax:  [phone]  Name: Jasmine Fitzgerald MRN: [id number] Date of Birth: 02-10-1946

## 2016-06-07 ENCOUNTER — Ambulatory Visit: Payer: No Typology Code available for payment source | Admitting: Family Medicine

## 2016-06-08 ENCOUNTER — Encounter: Payer: Medicare Other | Admitting: Physical Therapy

## 2016-06-10 ENCOUNTER — Ambulatory Visit (INDEPENDENT_AMBULATORY_CARE_PROVIDER_SITE_OTHER): Payer: Medicare Other | Admitting: Physical Therapy

## 2016-06-10 DIAGNOSIS — R29898 Other symptoms and signs involving the musculoskeletal system: Secondary | ICD-10-CM | POA: Diagnosis not present

## 2016-06-10 DIAGNOSIS — M461 Sacroiliitis, not elsewhere classified: Secondary | ICD-10-CM | POA: Diagnosis not present

## 2016-06-10 DIAGNOSIS — M545 Low back pain: Secondary | ICD-10-CM | POA: Diagnosis not present

## 2016-06-10 DIAGNOSIS — M47817 Spondylosis without myelopathy or radiculopathy, lumbosacral region: Secondary | ICD-10-CM | POA: Diagnosis not present

## 2016-06-10 DIAGNOSIS — M5442 Lumbago with sciatica, left side: Secondary | ICD-10-CM | POA: Diagnosis not present

## 2016-06-10 DIAGNOSIS — M5416 Radiculopathy, lumbar region: Secondary | ICD-10-CM | POA: Diagnosis not present

## 2016-06-10 NOTE — Therapy (Signed)
[place], [place], [address] Phone: [phone]   Fax:  [phone]  Physical Therapy Treatment  Patient Details  Name: Jasmine Fitzgerald MRN: [id number] Date of Birth: [birth year] Referring Provider: Dr. Madilyn Fireman  Encounter Date: 06/10/2016      PT End of Session - 06/10/16 1457    Visit Number 2   Number of Visits 12   Date for PT Re-Evaluation 07/12/16   PT Start Time 1450   PT Stop Time 1549   PT Time Calculation (min) 59 min      Past Medical History:  Diagnosis Date  . Carpal tunnel syndrome   . Colon polyp   . Kidney stones   . Renal cyst    benign    Past Surgical History:  Procedure Laterality Date  . ABDOMINAL SURGERY    . BREAST REDUCTION SURGERY  2013  . HERNIA REPAIR  1996  . KIDNEY STONE SURGERY  2015  . KNEE ARTHROSCOPY    . TONSILLECTOMY  1957  . TRIGGER FINGER RELEASE  07/2015  . UPPER GASTROINTESTINAL ENDOSCOPY      There were no vitals filed for this visit.      Subjective Assessment - 06/10/16 1458    Subjective Pt reports she went to the beach after last visit.  She was performing leg stretches and believes she overstretched, because back flared up even worse.  She stopped performing stretches and has been controlling pain with rest, ice/heat, medication.  Back has "locked up " with bending over to brush teeth.     Pertinent History Lifting and pushing 11/16 when she had initial symptoms   How long can you sit comfortably? 10-15 min    Currently in Pain? Yes   Pain Score 3    Pain Location Back  sacrum/ buttock   Pain Orientation Left;Lower   Pain Descriptors / Indicators Throbbing   Pain Radiating Towards Lt buttocks to ankle.    Aggravating Factors  sitting    Pain Relieving Factors ice/heat             [facility] PT Assessment - 06/10/16 0001      Assessment   Medical Diagnosis Lumbar radiculopathy    Onset Date/Surgical Date 04/18/16   Hand  Dominance Right   Next MD Visit 06/13/16          [facility] Adult PT Treatment/Exercise - 06/10/16 0001      Lumbar Exercises: Stretches   Passive Hamstring Stretch 2 reps;30 seconds  opp leg bent.    ITB Stretch 2 reps;30 seconds  LLE only   Piriformis Stretch 2 reps;30 seconds     Lumbar Exercises: Aerobic   Stationary Bike NuStep L4: 6.5 min (slow pace initially, worked up towards 43 SPM)     Lumbar Exercises: Supine   Ab Set 5 seconds;10 reps   Bent Knee Raise 10 reps  with abdominal set     Moist Heat Therapy   Number Minutes Moist Heat 15 Minutes   Moist Heat Location Lumbar Spine;Hip     Electrical Stimulation   Electrical Stimulation Location Lt lumbar paraspinals and buttocks    Electrical Stimulation Action IFC   Electrical Stimulation Parameters to tolerance    Electrical Stimulation Goals Tone;Pain           PT Education - 06/10/16 1551    Education provided Yes   Education Details HEP   Person(s) Educated Patient   Methods Explanation;Handout;Demonstration;Tactile cues  Comprehension Verbalized understanding;Returned demonstration             PT Long Term Goals - 05/31/16 1138      PT LONG TERM GOAL #1   Title Improve flexibility and ROM in lumbar spine and Lt LE 07/12/16   Time 6   Period Weeks   Status New     PT LONG TERM GOAL #2   Title Improve core strength and stability allowing patient to return to normal functional activities with minimal to no pain 07/12/16   Time 6   Period Weeks   Status New     PT LONG TERM GOAL #3   Title Patient independent in HEP 07/12/16   Time 6   Period Weeks   Status New     PT LONG TERM GOAL #4   Title Improve FOTO to </= 38% limitation 07/12/16   Time 6   Period Weeks   Status New               Plan - 06/10/16 1629    Clinical Impression Statement Pt had recent re-flare up of symptoms since last session.  She had some difficulty tolerating supine position, but preferred this position to  others.  She required some VC / tactile cues for core engagement and breathing with exercise. Pt reported slight decrease in pain with estim. Pt required increased time between transitional movements due to pain No goals met, only 2nd visit.    PT Frequency 2x / week   PT Duration 6 weeks   PT Treatment/Interventions Patient/family education;ADLs/Self Care Home Management;Cryotherapy;Electrical Stimulation;Iontophoresis 68m/ml Dexamethasone;Moist Heat;Traction;Ultrasound;Manual techniques;Dry needling;Therapeutic activities;Therapeutic exercise   PT Next Visit Plan manual work v TDN for Lt hip/piriformis/glut medius; stretching; core stabilization; education; modalities as indicated   Consulted and Agree with Plan of Care Patient      Patient will benefit from skilled therapeutic intervention in order to improve the following deficits and impairments:  Postural dysfunction, Improper body mechanics, Decreased range of motion, Decreased mobility, Decreased strength, Increased fascial restricitons, Increased muscle spasms, Decreased activity tolerance, Pain  Visit Diagnosis: No diagnosis found.     Problem List Patient Active Problem List   Diagnosis Date Noted  . Lumbar radiculopathy 05/24/2016  . Atrophic vaginitis 02/15/2016  . Reflux 02/15/2016  . Restless leg 02/15/2016  . Renal cyst 02/15/2016  . 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 Anxiety and depression 07/15/2015   JKerin Perna PTA 06/10/16 4:34 PM  [address] [address]: [phone]  Fax:  [phone] Name: Jasmine HEISHMANMRN: [id number] of Birth: 131-Jan-1947

## 2016-06-10 NOTE — Patient Instructions (Signed)
  Abdominal Bracing With Pelvic Floor (Hook-Lying)   With neutral spine, tighten pelvic floor and abdominals. Hold 10 seconds. Repeat __10_ times. Do _1__ times a day.   Knee to Chest: Transverse Plane Stability   Bring one knee up, then return. Be sure pelvis does not roll side to side. Keep pelvis still. Lift knee __10_ times each leg. Restabilize pelvis. Repeat with other leg. Do _1-2__ sets, _1__ times per day.     [facility] [place], [place] [address]  [phone] (office) [phone] (fax)

## 2016-06-13 ENCOUNTER — Encounter (INDEPENDENT_AMBULATORY_CARE_PROVIDER_SITE_OTHER): Payer: Self-pay

## 2016-06-13 ENCOUNTER — Encounter: Payer: Self-pay | Admitting: Rehabilitative and Restorative Service Providers"

## 2016-06-13 ENCOUNTER — Ambulatory Visit (INDEPENDENT_AMBULATORY_CARE_PROVIDER_SITE_OTHER): Payer: Medicare Other | Admitting: Rehabilitative and Restorative Service Providers"

## 2016-06-13 DIAGNOSIS — M5442 Lumbago with sciatica, left side: Secondary | ICD-10-CM | POA: Diagnosis not present

## 2016-06-13 DIAGNOSIS — R29898 Other symptoms and signs involving the musculoskeletal system: Secondary | ICD-10-CM

## 2016-06-13 NOTE — Therapy (Addendum)
[facility] [place], [place], [address] Phone: [phone]   Fax:  [phone]  Physical Therapy Treatment  Patient Details  Name: Jasmine Fitzgerald MRN: [id number] Date of Birth: 1946/04/24 Referring Provider: Dr. Madilyn Fireman  Encounter Date: 06/13/2016      PT End of Session - 06/13/16 1030    Visit Number 3   Number of Visits 12   Date for PT Re-Evaluation 07/12/16   PT Start Time 1017   PT Stop Time 1107   PT Time Calculation (min) 50 min   Activity Tolerance Patient tolerated treatment well      Past Medical History:  Diagnosis Date  . Carpal tunnel syndrome   . Colon polyp   . Kidney stones   . Renal cyst    benign    Past Surgical History:  Procedure Laterality Date  . ABDOMINAL SURGERY    . BREAST REDUCTION SURGERY  2013  . HERNIA REPAIR  1996  . KIDNEY STONE SURGERY  2015  . KNEE ARTHROSCOPY    . TONSILLECTOMY  1957  . TRIGGER FINGER RELEASE  07/2015  . UPPER GASTROINTESTINAL ENDOSCOPY      There were no vitals filed for this visit.      Subjective Assessment - 06/13/16 1031    Subjective Patient reports that pain is intense and she is unable to function. She is using the TENS unit and pain medication and continues to be unable to function. Unable to do her exercises    Currently in Pain? Yes   Pain Score 9    Pain Location Back   Pain Orientation Left;Lower   Pain Type Chronic pain   Pain Radiating Towards Lt buttock to ankle    Pain Onset More than a month ago   Pain Frequency Intermittent            [facility] PT Assessment - 06/13/16 0001      Assessment   Medical Diagnosis Lumbar radiculopathy    Referring Provider Dr. Madilyn Fireman   Onset Date/Surgical Date 04/18/16   Hand Dominance Right   Next MD Visit 06/14/16     AROM   Overall AROM Comments bilat hip ROM WFL's note tightness in hip extension    Lumbar Flexion 50%   Lumbar Extension 25%   Lumbar - Right Side Bend 40%    Lumbar - Left Side Bend 40%   Lumbar - Right Rotation 35%   Lumbar - Left Rotation 35%     Strength   Overall Strength Comments bilat LE strength grossly WNL's to testing - except Lt hip abd 4/5 and bilat hip ext 4+/5 -  some pain in the Lt hip with resistive testing      Flexibility   Hamstrings tight Rt ~80 deg; Lt 75 deg   Quadriceps tight bilat    ITB tight Lt > Rt   Piriformis tight Lt > Rt     Palpation   Spinal mobility pain with CPA mobs L2-L5; Lt UPA mobs L3-L5    Palpation comment significant tightness through the Lt lumbar paraspinals, hip abductors, piriformis, greater trochanter                     [facility] Adult PT Treatment/Exercise - 06/13/16 0001      Lumbar Exercises: Supine   Ab Set 5 seconds;10 reps   Bent Knee Raise 10 reps  with abdominal set     Moist Heat Therapy   Number  Minutes Moist Heat 20 Minutes   Moist Heat Location Lumbar Spine;Hip  Lt     Acupuncturist Location Lt lumbar paraspinals and buttocks    Electrical Stimulation Action IFC   Electrical Stimulation Parameters to tolerance   Electrical Stimulation Goals Tone;Pain     Manual Therapy   Manual therapy comments pt prone on pillow   Soft tissue mobilization soft tissue mobilization with deep tissue work to pt tolerance working through the lumbar spine and Lt hip/buttock - tight through the piriformis musculature and hip abductors    Myofascial Release Lt LB to hip                 PT Education - 06/13/16 1058    Education provided Yes   Education Details positions for rest/support   Person(s) Educated Patient   Methods Explanation   Comprehension Verbalized understanding             PT Long Term Goals - 06/13/16 1101      PT LONG TERM GOAL #1   Title Improve flexibility and ROM in lumbar spine and Lt LE 07/12/16   Time 6   Period Weeks   Status On-going     PT LONG TERM GOAL #2   Title Improve core strength and  stability allowing patient to return to normal functional activities with minimal to no pain 07/12/16   Time 6   Period Weeks   Status On-going     PT LONG TERM GOAL #3   Title Patient independent in HEP 07/12/16   Time 6   Period Weeks   Status On-going     PT LONG TERM GOAL #4   Title Improve FOTO to </= 38% limitation 07/12/16   Time 6   Period Weeks   Status On-going               Plan - 06/13/16 1058    Clinical Impression Statement No significant changes clinically. Patient has been seen for three visits(out of town on vacationfor several days). Patient continues to have intermittent flare ups of pain limiting functional activity level and any ability to perform exercises. Responds well on a temporary basis to manual work through the Lt piriformis/hip abductor musculature and modalities. May benefit from further diagnostic testing to r/o lumbar disc involvement v muscular tightness creating radicular symptoms.    Rehab Potential Good   PT Frequency 2x / week   PT Duration 6 weeks   PT Treatment/Interventions Patient/family education;ADLs/Self Care Home Management;Cryotherapy;Electrical Stimulation;Iontophoresis 34m/ml Dexamethasone;Moist Heat;Traction;Ultrasound;Manual techniques;Dry needling;Therapeutic activities;Therapeutic exercise   PT Next Visit Plan manual work v TDN for Lt hip/piriformis/glut medius; stretching; core stabilization; education; modalities as indicated - wait to schedule until pt returns to MD tomorrow am    Consulted and Agree with Plan of Care Patient      Patient will benefit from skilled therapeutic intervention in order to improve the following deficits and impairments:  Postural dysfunction, Improper body mechanics, Decreased range of motion, Decreased mobility, Decreased strength, Increased fascial restricitons, Increased muscle spasms, Decreased activity tolerance, Pain  Visit Diagnosis: Lumbago with sciatica, left side  Other symptoms and  signs involving the musculoskeletal system     Problem List Patient Active Problem List   Diagnosis Date Noted  . Lumbar radiculopathy 05/24/2016  . Atrophic vaginitis 02/15/2016  . Reflux 02/15/2016  . Restless leg 02/15/2016  . Renal cyst 02/15/2016  . 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 Anxiety and depression  07/15/2015    Jasmine Fitzgerald Nilda Simmer PT, MPH  06/13/2016, 11:05 AM  [facility] [address] [place], [place], [address] Phone: [phone]   Fax:  [phone]  Name: Jasmine Fitzgerald MRN: [id number] Date of Birth: 02-06-1946  PHYSICAL THERAPY DISCHARGE SUMMARY  Visits from Start of Care: 3  Current functional level related to goals / functional outcomes: See last progress note for functional status at last PT visit   Remaining deficits: unknown   Education / Equipment: HEP Plan: Patient agrees to discharge.  Patient goals were partially met. Patient is being discharged due to                                                     ?????    Returned to MD for further diagnostic testing.  Jamillah Camilo P. Helene Kelp PT, MPH 09/26/16 9:37 AM

## 2016-06-14 ENCOUNTER — Ambulatory Visit (INDEPENDENT_AMBULATORY_CARE_PROVIDER_SITE_OTHER): Payer: Medicare Other | Admitting: Family Medicine

## 2016-06-14 ENCOUNTER — Encounter: Payer: Self-pay | Admitting: Family Medicine

## 2016-06-14 VITALS — BP 153/71 | HR 90 | Wt 170.0 lb

## 2016-06-14 DIAGNOSIS — M5416 Radiculopathy, lumbar region: Secondary | ICD-10-CM | POA: Diagnosis not present

## 2016-06-14 DIAGNOSIS — R7989 Other specified abnormal findings of blood chemistry: Secondary | ICD-10-CM

## 2016-06-14 MED ORDER — TRAZODONE HCL 50 MG PO TABS: 50.0000 mg | ORAL_TABLET | Freq: Every evening | ORAL | 1 refills | Status: DC | PRN

## 2016-06-14 MED ORDER — ALPRAZOLAM 0.5 MG PO TABS: 0.2500 mg | ORAL_TABLET | Freq: Every day | ORAL | 1 refills | Status: DC | PRN

## 2016-06-14 NOTE — Addendum Note (Signed)
Addended by: Teddy Spike on: 06/14/2016 12:03 PM   Modules accepted: Orders

## 2016-06-14 NOTE — Addendum Note (Signed)
Addended by: Teddy Spike on: 06/14/2016 12:58 PM   Modules accepted: Orders

## 2016-06-14 NOTE — Progress Notes (Signed)
Subjective:    CC:   HPI: Follow-up anxiety-she currently takes alprazolam 0.5 mg as needed. She does not take it every day. She reports that some of her anxieties been up a little bit but mostly secondary to pain.  She still continues to have left radicular lumbar pain. It starts in the back area and radiates down into the posterior left leg. She had an MRI back in December and has done multiple therapies including injections, physical therapy, and medications. More recently she saw her orthopedist, Dr. Sherley Bounds.  He recommended medial branch neurotomy for further treatment. We did get her back in a physical therapy and she is on for 3 sessions but she's had a hard time being consistent with being able to do her exercises because of persistent pain.  Past medical history, Surgical history, Family history not pertinant except as noted below, Social history, Allergies, and medications have been entered into the medical record, reviewed, and corrections made.   Review of Systems: No fevers, chills, night sweats, weight loss, chest pain, or shortness of breath.   Objective:    General: Well Developed, well nourished, and in no acute distress.  Neuro: Alert and oriented x3, extra-ocular muscles intact, sensation grossly intact.  HEENT: Normocephalic, atraumatic  Skin: Warm and dry, no rashes. Cardiac: Regular rate and rhythm, no murmurs rubs or gallops, no lower extremity edema.  Respiratory: Clear to auscultation bilaterally. Not using accessory muscles, speaking in full sentences.   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 Continue current regimen. I really do feel like her pain is contributing to a lot of her symptoms. PHQ 9 score was negative today.  Lumbar radiculopathy, left-we discussed taking 2 extra strength Tylenol 3 times a day as well as Aleve, one pill twice a day for her baseline pain control and then just using the tramadol more as needed. Hydrocodone which  she uses extremely sparingly because it makes her nauseated. At this point her pain has persisted. Recommend repeat MRI before considering medial branch neurotomy.

## 2016-06-15 ENCOUNTER — Ambulatory Visit (INDEPENDENT_AMBULATORY_CARE_PROVIDER_SITE_OTHER): Payer: Medicare Other | Admitting: Sports Medicine

## 2016-06-15 ENCOUNTER — Encounter: Payer: Self-pay | Admitting: Sports Medicine

## 2016-06-15 DIAGNOSIS — M5416 Radiculopathy, lumbar region: Secondary | ICD-10-CM

## 2016-06-15 LAB — VITAMIN D 25 HYDROXY (VIT D DEFICIENCY, FRACTURES): VIT D 25 HYDROXY: 35 ng/mL (ref 30–100)

## 2016-06-15 MED ORDER — DIAZEPAM 5 MG PO TABS: ORAL_TABLET | ORAL | 0 refills | Status: DC

## 2016-06-15 MED ORDER — DULOXETINE HCL 60 MG PO CPEP: 60.0000 mg | ORAL_CAPSULE | Freq: Every day | ORAL | 3 refills | Status: DC

## 2016-06-15 MED ORDER — PREDNISONE 50 MG PO TABS: ORAL_TABLET | ORAL | 0 refills | Status: DC

## 2016-06-15 NOTE — Assessment & Plan Note (Addendum)
Difficult to determine the pain generating structure. She said has had epidurals but we have no reports. She does have significant pain over the SI joint on the left. I'm going to obtain a new MRI of the lumbar spine and pelvis, she did have a fall we need to evaluate for a pelvic or sacral fracture. She will provide me reports of her injections. Increasing Cymbalta to 60 mg daily, discontinue gabapentin which is ineffective, and adding 5 days of prednisone. Return to see me to go over MRI results. She is also currently doing physical therapy.  MRI reviewed and shows severe bilateral L5-S1 facet arthritis, we will use this as our initial interventional target.

## 2016-06-15 NOTE — Progress Notes (Signed)
   Subjective:    I'm seeing this patient as a consultation for: Dr. Beatrice Lecher   CC: Back pain  HPI: This is a pleasant 70 year old female, she had back pain for some time now. She has had MRIs, several epidural injections, physical therapy seemed to help the most. More recently her pain doctor has been talking about doing medial branch blocks, she is unaware of exactly where her injections have been given. She was also placed on gabapentin which has been ineffective, as well as Cymbalta which has been minimally effective, pain is moderate, persistent, localized in the left side of the low back radiation down mostly to the thigh but occasionally to the foot. Better with flexion.  Past medical history, Surgical history, Family history not pertinant except as noted below, Social history, Allergies, and medications have been entered into the medical record, reviewed, and no changes needed.   Review of Systems: No headache, visual changes, nausea, vomiting, diarrhea, constipation, dizziness, abdominal pain, skin rash, fevers, chills, night sweats, weight loss, swollen lymph nodes, body aches, joint swelling, muscle aches, chest pain, shortness of breath, mood changes, visual or auditory hallucinations.   Objective:   General: Well Developed, well nourished, and in no acute distress.  Neuro/Psych: Alert and oriented x3, extra-ocular muscles intact, able to move all 4 extremities, sensation grossly intact. Skin: Warm and dry, no rashes noted.  Respiratory: Not using accessory muscles, speaking in full sentences, trachea midline.  Cardiovascular: Pulses palpable, no extremity edema. Abdomen: Does not appear distended.  Back Exam:  Inspection: Unremarkable  Motion: Flexion 45 deg, Extension 45 deg, Side Bending to 45 deg bilaterally,  Rotation to 45 deg bilaterally  SLR laying: Negative  XSLR laying: Negative  Palpable tenderness: Left sacroiliac joint. FABER: negative. Sensory change:  Gross sensation intact to all lumbar and sacral dermatomes.  Reflexes: 2+ at both patellar tendons, 2+ at achilles tendons, Babinski's downgoing.  Strength at foot  Plantar-flexion: 5/5 Dorsi-flexion: 5/5 Eversion: 5/5 Inversion: 5/5  Leg strength  Quad: 5/5 Hamstring: 5/5 Hip flexor: 5/5 Hip abductors: 5/5  Gait unremarkable.  Impression and Recommendations:   This case required medical decision making of moderate complexity.  Lumbar radiculopathy Difficult to determine the pain generating structure. She said has had epidurals but we have no reports. She does have significant pain over the SI joint on the left. I'm going to obtain a new MRI of the lumbar spine and pelvis, she did have a fall we need to evaluate for a pelvic or sacral fracture. She will provide me reports of her injections. Increasing Cymbalta to 60 mg daily, discontinue gabapentin which is ineffective, and adding 5 days of prednisone. Return to see me to go over MRI results. She is also currently doing physical therapy. I spent 40 minutes with this patient, greater than 50% was face-to-face time counseling regarding the above diagnoses

## 2016-06-16 ENCOUNTER — Other Ambulatory Visit: Payer: Medicare Other

## 2016-06-16 ENCOUNTER — Ambulatory Visit: Payer: No Typology Code available for payment source | Admitting: Family Medicine

## 2016-06-16 ENCOUNTER — Encounter: Payer: Medicare Other | Admitting: Physical Therapy

## 2016-06-16 NOTE — Addendum Note (Signed)
Addended by: Silverio Decamp on: 06/16/2016 02:53 PM   Modules accepted: Orders

## 2016-06-20 ENCOUNTER — Ambulatory Visit (INDEPENDENT_AMBULATORY_CARE_PROVIDER_SITE_OTHER): Payer: Medicare Other

## 2016-06-20 DIAGNOSIS — M5416 Radiculopathy, lumbar region: Secondary | ICD-10-CM

## 2016-06-20 DIAGNOSIS — M5127 Other intervertebral disc displacement, lumbosacral region: Secondary | ICD-10-CM

## 2016-06-20 DIAGNOSIS — M25552 Pain in left hip: Secondary | ICD-10-CM | POA: Diagnosis not present

## 2016-06-20 DIAGNOSIS — M94252 Chondromalacia, left hip: Secondary | ICD-10-CM | POA: Diagnosis not present

## 2016-06-20 DIAGNOSIS — M4806 Spinal stenosis, lumbar region: Secondary | ICD-10-CM

## 2016-06-20 DIAGNOSIS — M5136 Other intervertebral disc degeneration, lumbar region: Secondary | ICD-10-CM

## 2016-06-20 DIAGNOSIS — M5126 Other intervertebral disc displacement, lumbar region: Secondary | ICD-10-CM | POA: Diagnosis not present

## 2016-06-20 DIAGNOSIS — M94251 Chondromalacia, right hip: Secondary | ICD-10-CM | POA: Diagnosis not present

## 2016-06-20 DIAGNOSIS — M5137 Other intervertebral disc degeneration, lumbosacral region: Secondary | ICD-10-CM

## 2016-06-28 ENCOUNTER — Ambulatory Visit
Admission: RE | Admit: 2016-06-28 | Discharge: 2016-06-28 | Disposition: A | Discharge status: Home / Self Care | Payer: Medicare Other | Source: Ambulatory Visit | Attending: Sports Medicine | Admitting: Sports Medicine

## 2016-06-28 DIAGNOSIS — M545 Low back pain: Secondary | ICD-10-CM | POA: Diagnosis not present

## 2016-06-28 MED ORDER — METHYLPREDNISOLONE ACETATE 40 MG/ML INJ SUSP (RADIOLOG
120.0000 mg | Freq: Once | INTRAMUSCULAR | Status: AC
  Administered 2016-06-28: 120 mg via INTRA_ARTICULAR

## 2016-06-28 NOTE — Discharge Instructions (Signed)

## 2016-07-16 ENCOUNTER — Emergency Department (INDEPENDENT_AMBULATORY_CARE_PROVIDER_SITE_OTHER)
Admission: EM | Admit: 2016-07-16 | Discharge: 2016-07-16 | Disposition: A | Discharge status: Home / Self Care | Payer: Medicare Other | Source: Home / Self Care | Attending: Family Medicine | Admitting: Family Medicine

## 2016-07-16 ENCOUNTER — Encounter: Payer: Self-pay | Admitting: Emergency Medicine

## 2016-07-16 DIAGNOSIS — M5416 Radiculopathy, lumbar region: Secondary | ICD-10-CM

## 2016-07-16 MED ORDER — METHYLPREDNISOLONE SODIUM SUCC 125 MG IJ SOLR
80.0000 mg | Freq: Once | INTRAMUSCULAR | Status: AC
  Administered 2016-07-16: 80 mg via INTRAMUSCULAR

## 2016-07-16 MED ORDER — PREDNISONE 20 MG PO TABS
ORAL_TABLET | ORAL | 0 refills | Status: DC
Start: 2016-07-16 — End: 2016-08-29

## 2016-07-16 NOTE — Discharge Instructions (Signed)
Begin taking prednisone Sunday, 07/17/16. Apply ice pack for 20 to 30 minutes, 3 to 4 times daily  Continue until pain decreases.  May take Tylenol for pain.  Resume taking Flexeril (cyclobenzaprine) 10mg , every 8 hours.  Resume Gabapentin.

## 2016-07-16 NOTE — ED Triage Notes (Signed)
Pt c/o left leg/thigh pain radiating from buttocks.  No apparent injury, grabbing feeling.

## 2016-07-16 NOTE — ED Provider Notes (Signed)
[facility]    CSN: [id number] Arrival date & time: 07/16/16  1503  First Provider Contact:  First MD Initiated Contact with Patient 07/16/16 1636        History   Chief Complaint Chief Complaint  Patient presents with  . Leg Pain    HPI Jasmine Fitzgerald is a 70 y.o. female.   Patient has a history of severe low back pain, having undergone bilateral L5-S1 facet injections in August with good relief of pain.   However, two days ago she made an awkward misstep while on some bleachers and developed recurrent left side buttock pain that has persisted and radiates to her left leg/thigh.   She denies bowel or bladder dysfunction, and no saddle numbness.       The history is provided by the patient.  Back Pain  Location:  Lumbar spine Quality:  Cramping Radiates to: left buttock. Pain severity:  Moderate Pain is:  Same all the time Onset quality:  Sudden Duration:  2 days Timing:  Constant Progression:  Worsening Chronicity:  Recurrent Context: twisting   Relieved by:  Nothing Worsened by:  Ambulation and standing Ineffective treatments:  Cold packs Associated symptoms: leg pain   Associated symptoms: no abdominal pain, no bladder incontinence, no bowel incontinence, no dysuria, no fever, no numbness, no paresthesias, no pelvic pain, no perianal numbness, no tingling and no weakness     Past Medical History:  Diagnosis Date  . Carpal tunnel syndrome   . Colon polyp   . Kidney stones   . Renal cyst    benign    Patient Active Problem List   Diagnosis Date Noted  . Lumbar radiculopathy 05/24/2016  . Atrophic vaginitis 02/15/2016  . Reflux 02/15/2016  . Restless leg 02/15/2016  . Renal cyst 02/15/2016  . 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 Anxiety and depression 07/15/2015    Past Surgical History:  Procedure Laterality Date  . ABDOMINAL SURGERY    . BREAST REDUCTION SURGERY  2013  . HERNIA REPAIR  1996  . KIDNEY STONE SURGERY  2015  .  KNEE ARTHROSCOPY    . TONSILLECTOMY  1957  . TRIGGER FINGER RELEASE  07/2015  . UPPER GASTROINTESTINAL ENDOSCOPY      OB History    No data available       Home Medications    Prior to Admission medications   Medication Sig Start Date End Date Taking? Authorizing Provider  ALPRAZolam Duanne Moron) 0.5 MG tablet Take 0.5 tablets (0.25 mg total) by mouth daily as needed for anxiety. 06/14/16   Hali Marry, MD  DULoxetine (CYMBALTA) 60 MG capsule Take 1 capsule (60 mg total) by mouth daily. 06/15/16   Silverio Decamp, MD  omeprazole (PRILOSEC) 20 MG capsule  02/10/16   Historical Provider, MD  predniSONE (DELTASONE) 20 MG tablet Take one tab by mouth twice daily for 5 days, then one daily for 3 days. Take with food. 07/16/16   Kandra Nicolas, MD  traZODone (DESYREL) 50 MG tablet Take 1 tablet (50 mg total) by mouth at bedtime as needed for sleep. 06/14/16   Hali Marry, MD    Family History Family History  Problem Relation Age of Onset  . Heart failure Mother   . Heart failure Father   . Heart attack Father   . Diabetes Brother   . Cancer Maternal Aunt   . Cancer Maternal Uncle   . Ovarian cancer    . Lung cancer    .  Throat cancer    . Stroke Maternal Grandmother     Social History Social History  Substance Use Topics  . Smoking status: Never Smoker  . Smokeless tobacco: Never Used  . Alcohol use No     Allergies   Codeine and Hydrocodone   Review of Systems Review of Systems  Constitutional: Negative for fever.  Gastrointestinal: Negative for abdominal pain and bowel incontinence.  Genitourinary: Negative for bladder incontinence, dysuria and pelvic pain.  Musculoskeletal: Positive for back pain.  Neurological: Negative for tingling, weakness, numbness and paresthesias.     Physical Exam Triage Vital Signs ED Triage Vitals  Enc Vitals Group     BP 07/16/16 1539 160/81     Pulse Rate 07/16/16 1539 87     Resp --      Temp 07/16/16 1539  98.1 F (36.7 C)     Temp Source 07/16/16 1539 Oral     SpO2 07/16/16 1539 98 %     Weight 07/16/16 1540 165 lb 8 oz (75.1 kg)     Height 07/16/16 1540 5\' 2"  (1.575 m)     Head Circumference --      Peak Flow --      Pain Score 07/16/16 1542 8     Pain Loc --      Pain Edu? --      Excl. in [place]? --    No data found.   Updated Vital Signs BP 160/81 (BP Location: Left Arm)   Pulse 87   Temp 98.1 F (36.7 C) (Oral)   Ht 5\' 2"  (1.575 m)   Wt 165 lb 8 oz (75.1 kg)   SpO2 98%   BMI 30.27 kg/m   Visual Acuity Right Eye Distance:   Left Eye Distance:   Bilateral Distance:    Right Eye Near:   Left Eye Near:    Bilateral Near:     Physical Exam  Constitutional: She appears well-developed and well-nourished. No distress.  HENT:  Head: Normocephalic.  Nose: Nose normal.  Mouth/Throat: Oropharynx is clear and moist.  Eyes: Conjunctivae are normal. Pupils are equal, round, and reactive to light.  Neck: Normal range of motion.  Cardiovascular: Normal heart sounds.   Pulmonary/Chest: Breath sounds normal.  Abdominal: Bowel sounds are normal.  Musculoskeletal: She exhibits no edema.       Lumbar back: She exhibits decreased range of motion and tenderness.  Back has decreased range of motion; lower back has multiple trigger point areas of tenderness.  Neurological: She is alert.  Skin: Skin is warm and dry. No rash noted.  Nursing note and vitals reviewed.    UC Treatments / Results  Labs (all labs ordered are listed, but only abnormal results are displayed) Labs Reviewed - No data to display  EKG  EKG Interpretation None       Radiology No results found.  Procedures Procedures (including critical care time)  Medications Ordered in UC Medications  methylPREDNISolone sodium succinate (SOLU-MEDROL) 125 mg/2 mL injection 80 mg (80 mg Intramuscular Given 07/16/16 1717)     Initial Impression / Assessment and Plan / UC Course  I have reviewed the triage vital  signs and the nursing notes.  Pertinent labs & imaging results that were available during my care of the patient were reviewed by me and considered in my medical decision making (see chart for details).  Clinical Course  Recurrent low back pain with left radiculopathy. Administered Solumedrol 80mg  IM;  Begin taking prednisone burst/taper Sunday,  07/17/16. Apply ice pack for 20 to 30 minutes, 3 to 4 times daily  Continue until pain decreases.  May take Tylenol for pain.  Resume taking Flexeril (cyclobenzaprine) 10mg , every 8 hours.  Resume Gabapentin. Followup with Dr. Aundria Mems in 4 to 5 days.    Final Clinical Impressions(s) / UC Diagnoses   Final diagnoses:  Acute left lumbar radiculopathy    New Prescriptions New Prescriptions   PREDNISONE (DELTASONE) 20 MG TABLET    Take one tab by mouth twice daily for 5 days, then one daily for 3 days. Take with food.     Kandra Nicolas, MD 07/27/16 [phone]

## 2016-07-18 ENCOUNTER — Encounter: Payer: Self-pay | Admitting: Sports Medicine

## 2016-07-18 ENCOUNTER — Ambulatory Visit (INDEPENDENT_AMBULATORY_CARE_PROVIDER_SITE_OTHER): Payer: Medicare Other | Admitting: Sports Medicine

## 2016-07-18 DIAGNOSIS — M797 Fibromyalgia: Secondary | ICD-10-CM

## 2016-07-18 DIAGNOSIS — Z23 Encounter for immunization: Secondary | ICD-10-CM

## 2016-07-18 DIAGNOSIS — M5416 Radiculopathy, lumbar region: Secondary | ICD-10-CM | POA: Diagnosis not present

## 2016-07-18 DIAGNOSIS — M545 Low back pain: Secondary | ICD-10-CM | POA: Diagnosis not present

## 2016-07-18 NOTE — Assessment & Plan Note (Signed)
Good initial response with near complete pain relief after bilateral L5-S1 facet injections. Now with recurrence of pain so we will set up medial branch blocks and radiofrequency ablation. 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 Vertell Limber, her neurosurgeon of choice. Return to see me after radio frequency ablation. Ultimately she may require narcotic pain management.

## 2016-07-18 NOTE — Assessment & Plan Note (Signed)
Continue current dose of Cymbalta. We will probably increase it to 120 at the next visit.

## 2016-07-18 NOTE — Progress Notes (Signed)
  Subjective:    CC: Follow-up  HPI: This is a pleasant 70 year old female with multifactorial severe low back pain, we recently proceeded with bilateral L5-S1 facet injections that provided complete relief for over a week. She took a misstep and then had a recurrence of pain. She also had some central canal stenosis. Pain is worse with extension and better with flexion. No bowel or bladder dysfunction or saddle numbness.  Past medical history, Surgical history, Family history not pertinant except as noted below, Social history, Allergies, and medications have been entered into the medical record, reviewed, and no changes needed.   Review of Systems: No fevers, chills, night sweats, weight loss, chest pain, or shortness of breath.   Objective:    General: Well Developed, well nourished, and in no acute distress.  Neuro: Alert and oriented x3, extra-ocular muscles intact, sensation grossly intact.  HEENT: Normocephalic, atraumatic, pupils equal round reactive to light, neck supple, no masses, no lymphadenopathy, thyroid nonpalpable.  Skin: Warm and dry, no rashes.Tender to palpation out of proportion to degree of palpation at multiple locations on the hips and thighs. Cardiac: Regular rate and rhythm, no murmurs rubs or gallops, no lower extremity edema.  Respiratory: Clear to auscultation bilaterally. Not using accessory muscles, speaking in full sentences.  Impression and Recommendations:    Lumbar radiculopathy Good initial response with near complete pain relief after bilateral L5-S1 facet injections. Now with recurrence of pain so we will set up medial branch blocks and radiofrequency ablation. 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 Vertell Limber, her neurosurgeon of choice. Return to see me after radio frequency ablation. Ultimately she may require narcotic pain  management.  Fibromyalgia Continue current dose of Cymbalta. We will probably increase it to 120 at the next visit.  I spent 25 minutes with this patient, greater than 50% was face-to-face time counseling regarding the above diagnoses

## 2016-07-19 ENCOUNTER — Other Ambulatory Visit: Payer: Self-pay | Admitting: Sports Medicine

## 2016-07-19 DIAGNOSIS — M5416 Radiculopathy, lumbar region: Secondary | ICD-10-CM

## 2016-07-20 ENCOUNTER — Other Ambulatory Visit: Payer: Self-pay | Admitting: Sports Medicine

## 2016-07-20 ENCOUNTER — Ambulatory Visit
Admission: RE | Admit: 2016-07-20 | Discharge: 2016-07-20 | Disposition: A | Discharge status: Home / Self Care | Payer: Medicare Other | Source: Ambulatory Visit | Attending: Sports Medicine | Admitting: Sports Medicine

## 2016-07-20 DIAGNOSIS — M5416 Radiculopathy, lumbar region: Secondary | ICD-10-CM

## 2016-07-20 DIAGNOSIS — M545 Low back pain: Secondary | ICD-10-CM | POA: Diagnosis not present

## 2016-07-20 MED ORDER — METHYLPREDNISOLONE ACETATE 40 MG/ML INJ SUSP (RADIOLOG
120.0000 mg | Freq: Once | INTRAMUSCULAR | Status: AC
  Administered 2016-07-20: 120 mg via INTRA_ARTICULAR

## 2016-07-20 NOTE — Discharge Instructions (Signed)

## 2016-07-29 ENCOUNTER — Encounter: Payer: Self-pay | Admitting: Sports Medicine

## 2016-07-29 ENCOUNTER — Ambulatory Visit (INDEPENDENT_AMBULATORY_CARE_PROVIDER_SITE_OTHER): Payer: Medicare Other | Admitting: Sports Medicine

## 2016-07-29 DIAGNOSIS — M5416 Radiculopathy, lumbar region: Secondary | ICD-10-CM | POA: Diagnosis not present

## 2016-07-29 MED ORDER — TRAMADOL-ACETAMINOPHEN 37.5-325 MG PO TABS
1.0000 | ORAL_TABLET | Freq: Three times a day (TID) | ORAL | 0 refills | Status: DC | PRN
Start: 2016-07-29 — End: 2016-09-12

## 2016-07-29 NOTE — Assessment & Plan Note (Signed)
Continue Cymbalta for now, we will probably have to increase to 120 mg before long.

## 2016-07-29 NOTE — Assessment & Plan Note (Addendum)
Severe pain, she doesn't seem to have had a good enough response to her facet injections to proceed with radio frequency ablation. Going to proceed with a left L5-S1 interlaminar epidural sooner rather than later. She also needs to get in with Dr. Vertell Limber with neurosurgery. My suspicion is that she will ultimately need narcotic pain management. Switching to Ultracet but we will not be prescribing schedule 2 narcotics

## 2016-07-29 NOTE — Progress Notes (Signed)
  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 She is now reporting pain in the midline of the low back, into the right buttock, down the posterior lateral aspect of the thigh, lower leg, and foot. Pain is severe, she is tearful, no bowel or bladder distention, 7 numbness, constitutional symptoms.  Past medical history:  Negative.  See flowsheet/record as well for more information.  Surgical history: Negative.  See flowsheet/record as well for more information.  Family history: Negative.  See flowsheet/record as well for more information.  Social history: Negative.  See flowsheet/record as well for more information.  Allergies, and medications have been entered into the medical record, reviewed, and no changes needed.   Review of Systems: No fevers, chills, night sweats, weight loss, chest pain, or shortness of breath.   Objective:    General: Well Developed, well nourished, and in no acute distress.  Neuro: Alert and oriented x3, extra-ocular muscles intact, sensation grossly intact.  HEENT: Normocephalic, atraumatic, pupils equal round reactive to light, neck supple, no masses, no lymphadenopathy, thyroid nonpalpable.  Skin: Warm and dry, no rashes. Cardiac: Regular rate and rhythm, no murmurs rubs or gallops, no lower extremity edema.  Respiratory: Clear to auscultation bilaterally. Not using accessory muscles, speaking in full sentences.  Impression and Recommendations:    Lumbar radiculopathy Severe pain, she doesn't seem to have had a good enough response to her facet injections to  proceed with radio frequency ablation. Going to proceed with a left L5-S1 interlaminar epidural sooner rather than later. She also needs to get in with Dr. Vertell Limber with neurosurgery. My suspicion is that she will ultimately need narcotic pain management. Switching to Ultracet but we will not be prescribing schedule 2 narcotics   I spent 40 minutes with this patient, greater than 50% was face-to-face time counseling regarding the above diagnoses

## 2016-08-01 ENCOUNTER — Telehealth: Payer: Self-pay

## 2016-08-01 DIAGNOSIS — M51369 Other intervertebral disc degeneration, lumbar region without mention of lumbar back pain or lower extremity pain: Secondary | ICD-10-CM

## 2016-08-01 DIAGNOSIS — M5136 Other intervertebral disc degeneration, lumbar region: Secondary | ICD-10-CM

## 2016-08-01 NOTE — Telephone Encounter (Signed)
Daughter of patient notified referrals placed.

## 2016-08-01 NOTE — Telephone Encounter (Signed)
Referrals placed 

## 2016-08-01 NOTE — Telephone Encounter (Signed)
Daughter of patient called and would like to know if pt can have a referral for a hospital bed and physical therapy. Please advise.

## 2016-08-02 ENCOUNTER — Telehealth: Payer: Self-pay

## 2016-08-02 DIAGNOSIS — M47816 Spondylosis without myelopathy or radiculopathy, lumbar region: Secondary | ICD-10-CM

## 2016-08-02 NOTE — Telephone Encounter (Signed)
Referral placed.

## 2016-08-02 NOTE — Telephone Encounter (Signed)
Pt would like referral for PT put in to [facility]. Please assist.

## 2016-08-05 ENCOUNTER — Telehealth: Payer: Self-pay

## 2016-08-05 MED ORDER — AMBULATORY NON FORMULARY MEDICATION: 0 refills | Status: DC

## 2016-08-05 NOTE — Telephone Encounter (Signed)
Rx in box. 

## 2016-08-05 NOTE — Telephone Encounter (Signed)
Pt is in need of a written prescription to get the hospital bed. Please assist.

## 2016-08-08 ENCOUNTER — Other Ambulatory Visit: Payer: Medicare Other

## 2016-08-08 DIAGNOSIS — R200 Anesthesia of skin: Secondary | ICD-10-CM | POA: Diagnosis not present

## 2016-08-09 DIAGNOSIS — M48061 Spinal stenosis, lumbar region without neurogenic claudication: Secondary | ICD-10-CM | POA: Diagnosis not present

## 2016-08-11 DIAGNOSIS — F419 Anxiety disorder, unspecified: Secondary | ICD-10-CM | POA: Diagnosis not present

## 2016-08-11 DIAGNOSIS — Z885 Allergy status to narcotic agent status: Secondary | ICD-10-CM | POA: Diagnosis not present

## 2016-08-11 DIAGNOSIS — F329 Major depressive disorder, single episode, unspecified: Secondary | ICD-10-CM | POA: Diagnosis not present

## 2016-08-11 DIAGNOSIS — R079 Chest pain, unspecified: Secondary | ICD-10-CM | POA: Insufficient documentation

## 2016-08-11 DIAGNOSIS — G8929 Other chronic pain: Secondary | ICD-10-CM | POA: Diagnosis not present

## 2016-08-11 DIAGNOSIS — R000 Tachycardia, unspecified: Secondary | ICD-10-CM | POA: Diagnosis not present

## 2016-08-11 DIAGNOSIS — K5901 Slow transit constipation: Secondary | ICD-10-CM | POA: Diagnosis not present

## 2016-08-11 DIAGNOSIS — Z79899 Other long term (current) drug therapy: Secondary | ICD-10-CM | POA: Diagnosis not present

## 2016-08-11 DIAGNOSIS — R42 Dizziness and giddiness: Secondary | ICD-10-CM | POA: Diagnosis not present

## 2016-08-11 DIAGNOSIS — I774 Celiac artery compression syndrome: Secondary | ICD-10-CM | POA: Diagnosis not present

## 2016-08-11 DIAGNOSIS — Z791 Long term (current) use of non-steroidal anti-inflammatories (NSAID): Secondary | ICD-10-CM | POA: Diagnosis not present

## 2016-08-11 DIAGNOSIS — K7689 Other specified diseases of liver: Secondary | ICD-10-CM | POA: Diagnosis not present

## 2016-08-11 DIAGNOSIS — M5416 Radiculopathy, lumbar region: Secondary | ICD-10-CM | POA: Diagnosis not present

## 2016-08-11 DIAGNOSIS — I728 Aneurysm of other specified arteries: Secondary | ICD-10-CM | POA: Diagnosis not present

## 2016-08-11 DIAGNOSIS — J984 Other disorders of lung: Secondary | ICD-10-CM | POA: Diagnosis not present

## 2016-08-11 DIAGNOSIS — R0602 Shortness of breath: Secondary | ICD-10-CM | POA: Diagnosis not present

## 2016-08-11 DIAGNOSIS — R071 Chest pain on breathing: Secondary | ICD-10-CM | POA: Diagnosis not present

## 2016-08-11 DIAGNOSIS — R0789 Other chest pain: Secondary | ICD-10-CM | POA: Diagnosis not present

## 2016-08-11 DIAGNOSIS — K219 Gastro-esophageal reflux disease without esophagitis: Secondary | ICD-10-CM | POA: Diagnosis not present

## 2016-08-11 DIAGNOSIS — M545 Low back pain: Secondary | ICD-10-CM | POA: Diagnosis not present

## 2016-08-12 DIAGNOSIS — M48062 Spinal stenosis, lumbar region with neurogenic claudication: Secondary | ICD-10-CM | POA: Diagnosis not present

## 2016-08-12 DIAGNOSIS — I728 Aneurysm of other specified arteries: Secondary | ICD-10-CM | POA: Diagnosis not present

## 2016-08-12 DIAGNOSIS — R52 Pain, unspecified: Secondary | ICD-10-CM | POA: Diagnosis not present

## 2016-08-12 DIAGNOSIS — M5442 Lumbago with sciatica, left side: Secondary | ICD-10-CM | POA: Diagnosis not present

## 2016-08-12 DIAGNOSIS — R0789 Other chest pain: Secondary | ICD-10-CM | POA: Diagnosis not present

## 2016-08-12 DIAGNOSIS — R079 Chest pain, unspecified: Secondary | ICD-10-CM | POA: Diagnosis not present

## 2016-08-13 DIAGNOSIS — R0789 Other chest pain: Secondary | ICD-10-CM | POA: Diagnosis not present

## 2016-08-13 DIAGNOSIS — R52 Pain, unspecified: Secondary | ICD-10-CM | POA: Diagnosis not present

## 2016-08-13 DIAGNOSIS — M48062 Spinal stenosis, lumbar region with neurogenic claudication: Secondary | ICD-10-CM | POA: Diagnosis not present

## 2016-08-13 DIAGNOSIS — K7689 Other specified diseases of liver: Secondary | ICD-10-CM | POA: Diagnosis not present

## 2016-08-13 DIAGNOSIS — M5442 Lumbago with sciatica, left side: Secondary | ICD-10-CM | POA: Diagnosis not present

## 2016-08-13 DIAGNOSIS — I728 Aneurysm of other specified arteries: Secondary | ICD-10-CM | POA: Diagnosis not present

## 2016-08-13 DIAGNOSIS — D1803 Hemangioma of intra-abdominal structures: Secondary | ICD-10-CM | POA: Diagnosis not present

## 2016-08-15 DIAGNOSIS — M9983 Other biomechanical lesions of lumbar region: Secondary | ICD-10-CM | POA: Diagnosis not present

## 2016-08-15 DIAGNOSIS — M5416 Radiculopathy, lumbar region: Secondary | ICD-10-CM | POA: Diagnosis not present

## 2016-08-15 DIAGNOSIS — Z885 Allergy status to narcotic agent status: Secondary | ICD-10-CM | POA: Diagnosis not present

## 2016-08-15 DIAGNOSIS — M48061 Spinal stenosis, lumbar region without neurogenic claudication: Secondary | ICD-10-CM | POA: Diagnosis not present

## 2016-08-15 DIAGNOSIS — M5442 Lumbago with sciatica, left side: Secondary | ICD-10-CM

## 2016-08-15 DIAGNOSIS — M5116 Intervertebral disc disorders with radiculopathy, lumbar region: Secondary | ICD-10-CM | POA: Diagnosis not present

## 2016-08-15 DIAGNOSIS — G8929 Other chronic pain: Secondary | ICD-10-CM | POA: Insufficient documentation

## 2016-08-19 DIAGNOSIS — M545 Low back pain: Secondary | ICD-10-CM | POA: Diagnosis not present

## 2016-08-19 DIAGNOSIS — M25552 Pain in left hip: Secondary | ICD-10-CM | POA: Diagnosis not present

## 2016-08-22 DIAGNOSIS — M1288 Other specific arthropathies, not elsewhere classified, other specified site: Secondary | ICD-10-CM | POA: Diagnosis not present

## 2016-08-22 DIAGNOSIS — M5416 Radiculopathy, lumbar region: Secondary | ICD-10-CM | POA: Diagnosis not present

## 2016-08-22 DIAGNOSIS — M545 Low back pain: Secondary | ICD-10-CM | POA: Diagnosis not present

## 2016-08-22 DIAGNOSIS — M5126 Other intervertebral disc displacement, lumbar region: Secondary | ICD-10-CM | POA: Diagnosis not present

## 2016-08-23 ENCOUNTER — Other Ambulatory Visit: Payer: Medicare Other

## 2016-08-29 ENCOUNTER — Encounter: Payer: Self-pay | Admitting: Family Medicine

## 2016-08-29 ENCOUNTER — Ambulatory Visit (INDEPENDENT_AMBULATORY_CARE_PROVIDER_SITE_OTHER): Payer: Medicare Other | Admitting: Family Medicine

## 2016-08-29 VITALS — BP 176/92 | HR 86 | Ht 62.6 in | Wt 173.0 lb

## 2016-08-29 DIAGNOSIS — I10 Essential (primary) hypertension: Secondary | ICD-10-CM | POA: Diagnosis not present

## 2016-08-29 DIAGNOSIS — I714 Abdominal aortic aneurysm, without rupture, unspecified: Secondary | ICD-10-CM

## 2016-08-29 DIAGNOSIS — R7309 Other abnormal glucose: Secondary | ICD-10-CM | POA: Diagnosis not present

## 2016-08-29 DIAGNOSIS — H6190 Disorder of external ear, unspecified, unspecified ear: Secondary | ICD-10-CM

## 2016-08-29 DIAGNOSIS — E119 Type 2 diabetes mellitus without complications: Secondary | ICD-10-CM | POA: Diagnosis not present

## 2016-08-29 LAB — POCT GLYCOSYLATED HEMOGLOBIN (HGB A1C): HEMOGLOBIN A1C: 6.6

## 2016-08-29 MED ORDER — LOSARTAN POTASSIUM-HCTZ 50-12.5 MG PO TABS: 1.0000 | ORAL_TABLET | Freq: Every day | ORAL | 1 refills | Status: DC

## 2016-08-29 MED ORDER — MUPIROCIN 2 % EX OINT: TOPICAL_OINTMENT | Freq: Two times a day (BID) | CUTANEOUS | 0 refills | Status: DC

## 2016-08-29 MED ORDER — BLOOD GLUCOSE MONITOR KIT: PACK | 99 refills | Status: AC

## 2016-08-29 NOTE — Patient Instructions (Signed)
Stop the meloxicam

## 2016-08-29 NOTE — Progress Notes (Addendum)
Subjective:    Patient ID: Jasmine Fitzgerald, female    DOB: 1946/09/26, 70 y.o.   MRN: [id number]  HPI Here to discuss elevated blood pressures. Brought in home BP cuff.  They have been running high as well.  She has an abdominal aneurysm and has f/u with her vascular surgeon this week.  She had a procedure done for her back on 10/16 and her BP was 220/110 and was told to contact her medical doctor.  Off her tramadol. She is also taking meloxicam 7.33m BID.      She also has a sore spot on her ear she wants me to look at. She has been putting neosporin on it.  She said she has scratched at it. She doesn't remember any trauma or injury that would have caused the problem. She was also worried about potential of having diabetes and would like to be tested today. She recently went to the emergency department with chest pain thinking that she was having a heart attack. While there she did have a CT angiogram done of the chest and pelvis. 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 It had enlarged from previous scan noted in February 2012 when it measured 1.7 cm x 1.5 cm. 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  Review of Systems  BP (!) 176/92 (BP Location: Right Arm, Cuff Size: Large)   Pulse 86   Ht 5' 2.6" (1.59 m)   Wt 173 lb (78.5 kg)   SpO2 100%   BMI 31.04 kg/m     Allergies  Allergen Reactions  . Codeine Nausea And Vomiting and Other (See Comments)    GI Upset  . Hydrocodone Nausea And Vomiting    Past Medical History:  Diagnosis Date  . Carpal tunnel syndrome   . Colon polyp   . Kidney stones   . Renal cyst    benign    Past Surgical History:  Procedure Laterality Date  . ABDOMINAL SURGERY    . BREAST REDUCTION SURGERY  2013  . HERNIA REPAIR  1996  . KIDNEY STONE SURGERY  2015  . KNEE ARTHROSCOPY    . TONSILLECTOMY  1957  .  TRIGGER FINGER RELEASE  07/2015  . UPPER GASTROINTESTINAL ENDOSCOPY      Social History   Social History  . Marital status: Married    Spouse name: WPatrick Jupiter . Number of children: 3  . Years of education: 12   Occupational History  . Retired    Social History Main Topics  . Smoking status: Never Smoker  . Smokeless tobacco: Never Used  . Alcohol use No  . Drug use: No  . Sexual activity: Not Currently   Other Topics Concern  . Not on file   Social History Narrative   Retired. High school degree. Married to [place] 3 adult children. Currently lives with her spouse.    Family History  Problem Relation Age of Onset  . Heart failure Mother   . Heart failure Father   . Heart attack Father   . Diabetes Brother   . Cancer Maternal Aunt   . Cancer Maternal Uncle   . Ovarian cancer    . Lung cancer    . Throat cancer    . Stroke Maternal Grandmother     Outpatient Encounter Prescriptions as of 08/29/2016  Medication Sig  . ALPRAZolam (XANAX) 0.5 MG tablet Take 0.5 tablets (0.25 mg total) by mouth daily as needed for anxiety.  . [facility] Bed for use at home.  . Biotin 5000 MCG CAPS Take 1 capsule by mouth daily.  . DULoxetine (CYMBALTA) 60 MG capsule Take 1 capsule (60 mg total) by mouth daily.  . meloxicam (MOBIC) 7.5 MG tablet Take 7.5 mg by mouth 2 (two) times daily.  Marland Kitchen omeprazole (PRILOSEC) 20 MG capsule   . OxyCODONE HCl 7.5 MG TABA Take 1 tablet by mouth every 4 (four) hours as needed.  . traZODone (DESYREL) 50 MG tablet Take 1 tablet (50 mg total) by mouth at bedtime as needed for sleep.  . blood glucose meter kit and supplies KIT Dispense based on patient and insurance preference. Use up to once daily as directed. (FOR ICD-9 250.00, 250.01).  Marland Kitchen losartan-hydrochlorothiazide (HYZAAR) 50-12.5 MG tablet Take 1 tablet by mouth daily.  . mupirocin ointment (BACTROBAN) 2 % Apply topically 2 (two) times daily.  . traMADol-acetaminophen  (ULTRACET) 37.5-325 MG tablet Take 1-2 tablets by mouth every 8 (eight) hours as needed for severe pain. (Patient not taking: Reported on 08/29/2016)  . [DISCONTINUED] predniSONE (DELTASONE) 20 MG tablet Take one tab by mouth twice daily for 5 days, then one daily for 3 days. Take with food.   No facility-administered encounter medications on file as of 08/29/2016.          Objective:   Physical Exam  Constitutional: She is oriented to person, place, and time. She appears well-developed and well-nourished.  HENT:  Head: Normocephalic and atraumatic.  Cardiovascular: Normal rate, regular rhythm and normal heart sounds.   Pulmonary/Chest: Effort normal and breath sounds normal.  Neurological: She is alert and oriented to person, place, and time.  Skin: Skin is warm and dry.  Psychiatric: She has a normal mood and affect. Her behavior is normal.    On her right outer ear she has an approximately 1 cm excoriated and scabbed lesion on the outer pinna. No active drainage. No surrounding erythema.    Assessment & Plan:  HTN - New Dx.  We'll start her on a new blood pressure medication called losartan HCT. We'll follow-up in 2-3 weeks for repeat blood pressure check and will check a BMP at that time.  Abnormal glucose  - Hemoglobin A1c of 6.6 today. That technically makes her diabetic. We discussed dietary measures. Right now because of her back pain she is unable to exercise regularly. Her focus strongly on diet. Offered to get her in with diabetes and nutrition classes. At this time we will delay that since she has a lot going on with her spine. Follow-up in 3 months for repeat hemoglobin A1c. Her daughter was here for the conversation today as well. Consider starting metformin at follow-up visit depending on her A1c. I did ask her to keep a home glucose log. The never prescription for glucometer lancets and strips to her pharmacy. I asked her to check it 3 days a week in the morning fasting and  bring this with her at her follow-up appointment in 3 weeks.  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 She has an appointment on Wednesday with vascular surgeon for further evaluation and consultation. The digital cholesterol while she was at the hospital so we'll get that entered into the system.  Lesion on right ear-unclear etiology right now has a scab on it because it's been excoriated. Encouraged her to  stop using the Neosporin and will try mupirocin ointment for 2 weeks. Please allow to heal avoid picking and scratching at it and we can see if it might be a lesion such as an atypical skin lesion that might need to be biopsied.

## 2016-08-30 DIAGNOSIS — M533 Sacrococcygeal disorders, not elsewhere classified: Secondary | ICD-10-CM | POA: Insufficient documentation

## 2016-08-30 DIAGNOSIS — M5136 Other intervertebral disc degeneration, lumbar region: Secondary | ICD-10-CM | POA: Insufficient documentation

## 2016-08-30 DIAGNOSIS — R200 Anesthesia of skin: Secondary | ICD-10-CM | POA: Insufficient documentation

## 2016-08-30 DIAGNOSIS — M51369 Other intervertebral disc degeneration, lumbar region without mention of lumbar back pain or lower extremity pain: Secondary | ICD-10-CM | POA: Insufficient documentation

## 2016-08-31 DIAGNOSIS — I728 Aneurysm of other specified arteries: Secondary | ICD-10-CM | POA: Diagnosis not present

## 2016-08-31 DIAGNOSIS — I748 Embolism and thrombosis of other arteries: Secondary | ICD-10-CM | POA: Diagnosis not present

## 2016-09-01 ENCOUNTER — Telehealth: Payer: Self-pay

## 2016-09-01 DIAGNOSIS — M5126 Other intervertebral disc displacement, lumbar region: Secondary | ICD-10-CM | POA: Diagnosis not present

## 2016-09-01 DIAGNOSIS — K219 Gastro-esophageal reflux disease without esophagitis: Secondary | ICD-10-CM | POA: Diagnosis not present

## 2016-09-01 DIAGNOSIS — I728 Aneurysm of other specified arteries: Secondary | ICD-10-CM | POA: Diagnosis not present

## 2016-09-01 DIAGNOSIS — R52 Pain, unspecified: Secondary | ICD-10-CM | POA: Diagnosis not present

## 2016-09-01 DIAGNOSIS — M25552 Pain in left hip: Secondary | ICD-10-CM | POA: Diagnosis not present

## 2016-09-01 DIAGNOSIS — E119 Type 2 diabetes mellitus without complications: Secondary | ICD-10-CM | POA: Diagnosis not present

## 2016-09-01 DIAGNOSIS — Z79899 Other long term (current) drug therapy: Secondary | ICD-10-CM | POA: Diagnosis not present

## 2016-09-01 DIAGNOSIS — H6011 Cellulitis of right external ear: Secondary | ICD-10-CM | POA: Diagnosis not present

## 2016-09-01 DIAGNOSIS — M5442 Lumbago with sciatica, left side: Secondary | ICD-10-CM | POA: Diagnosis not present

## 2016-09-01 DIAGNOSIS — Z885 Allergy status to narcotic agent status: Secondary | ICD-10-CM | POA: Diagnosis not present

## 2016-09-01 DIAGNOSIS — M47816 Spondylosis without myelopathy or radiculopathy, lumbar region: Secondary | ICD-10-CM | POA: Diagnosis not present

## 2016-09-01 DIAGNOSIS — M5432 Sciatica, left side: Secondary | ICD-10-CM | POA: Diagnosis not present

## 2016-09-01 DIAGNOSIS — M79605 Pain in left leg: Secondary | ICD-10-CM | POA: Diagnosis not present

## 2016-09-01 DIAGNOSIS — F419 Anxiety disorder, unspecified: Secondary | ICD-10-CM | POA: Diagnosis not present

## 2016-09-01 DIAGNOSIS — M5136 Other intervertebral disc degeneration, lumbar region: Secondary | ICD-10-CM | POA: Diagnosis not present

## 2016-09-01 NOTE — Telephone Encounter (Signed)
Let's try Naproxen 500mg  BID.

## 2016-09-01 NOTE — Telephone Encounter (Signed)
Patient's daughter advised of recommendations.  

## 2016-09-01 NOTE — Telephone Encounter (Signed)
Larene Beach, patient's daughter, called and states Jasmine Fitzgerald was prescribed Losartan for her elevated blood pressure and was instructed to stop taking all NSAID's and prednisone. Larene Beach reports her mom is crying out with back pain. I could her Coreen crying out in the back ground. I advised to to take her to the hospital if she is in that much pain. She would like to know if there are any NSAID's or antiinflammatories she can take. Please advise.

## 2016-09-01 NOTE — Telephone Encounter (Signed)
Left message advising of recommendations.  

## 2016-09-02 ENCOUNTER — Other Ambulatory Visit: Payer: Self-pay | Admitting: Family Medicine

## 2016-09-06 DIAGNOSIS — R262 Difficulty in walking, not elsewhere classified: Secondary | ICD-10-CM | POA: Diagnosis not present

## 2016-09-06 DIAGNOSIS — M545 Low back pain: Secondary | ICD-10-CM | POA: Diagnosis not present

## 2016-09-06 DIAGNOSIS — M5432 Sciatica, left side: Secondary | ICD-10-CM | POA: Diagnosis not present

## 2016-09-06 DIAGNOSIS — M25552 Pain in left hip: Secondary | ICD-10-CM | POA: Diagnosis not present

## 2016-09-07 DIAGNOSIS — M25552 Pain in left hip: Secondary | ICD-10-CM | POA: Diagnosis not present

## 2016-09-07 DIAGNOSIS — M545 Low back pain: Secondary | ICD-10-CM | POA: Diagnosis not present

## 2016-09-07 DIAGNOSIS — M5432 Sciatica, left side: Secondary | ICD-10-CM | POA: Diagnosis not present

## 2016-09-07 DIAGNOSIS — R262 Difficulty in walking, not elsewhere classified: Secondary | ICD-10-CM | POA: Diagnosis not present

## 2016-09-12 ENCOUNTER — Encounter: Payer: Self-pay | Admitting: Family Medicine

## 2016-09-12 ENCOUNTER — Ambulatory Visit (INDEPENDENT_AMBULATORY_CARE_PROVIDER_SITE_OTHER): Payer: Medicare Other | Admitting: Family Medicine

## 2016-09-12 VITALS — BP 130/68 | HR 96 | Ht 62.6 in | Wt 168.0 lb

## 2016-09-12 DIAGNOSIS — E119 Type 2 diabetes mellitus without complications: Secondary | ICD-10-CM | POA: Diagnosis not present

## 2016-09-12 DIAGNOSIS — L989 Disorder of the skin and subcutaneous tissue, unspecified: Secondary | ICD-10-CM

## 2016-09-12 DIAGNOSIS — M47816 Spondylosis without myelopathy or radiculopathy, lumbar region: Secondary | ICD-10-CM

## 2016-09-12 DIAGNOSIS — M545 Low back pain: Secondary | ICD-10-CM | POA: Diagnosis not present

## 2016-09-12 DIAGNOSIS — I10 Essential (primary) hypertension: Secondary | ICD-10-CM

## 2016-09-12 DIAGNOSIS — H6191 Disorder of right external ear, unspecified: Secondary | ICD-10-CM

## 2016-09-12 DIAGNOSIS — H6001 Abscess of right external ear: Secondary | ICD-10-CM | POA: Diagnosis not present

## 2016-09-12 DIAGNOSIS — R7309 Other abnormal glucose: Secondary | ICD-10-CM | POA: Diagnosis not present

## 2016-09-12 LAB — BASIC METABOLIC PANEL
BUN: 25 mg/dL — AB (ref 4–21)
CREATININE: 0.6 mg/dL (ref 0.5–1.1)
GLUCOSE: 75 mg/dL
POTASSIUM: 4.5 mmol/L (ref 3.4–5.3)
Sodium: 140 mmol/L (ref 137–147)

## 2016-09-12 MED ORDER — TRAZODONE HCL 50 MG PO TABS: 75.0000 mg | ORAL_TABLET | Freq: Every evening | ORAL | 3 refills | Status: DC | PRN

## 2016-09-12 MED ORDER — ALPRAZOLAM 0.25 MG PO TABS: 0.2500 mg | ORAL_TABLET | Freq: Every evening | ORAL | 0 refills | Status: DC | PRN

## 2016-09-12 NOTE — Progress Notes (Signed)
Subjective:    CC:   HPI:  Hypertension- Pt denies chest pain, SOB, dizziness, or heart palpitations.  Taking meds as directed w/o problems.  Denies medication side effects.  Reports home blood sugars have been well controlled. She has been checking her blood pressures at home and says they have been great. She forgot and left her log at home.  She had a spot on her right ear she wanted me to look at when she was here about 2 weeks ago. Had recommended Bactroban ointment. When she was seen in emergency department they put her on an oral antibiotic. Is been on the antibiotic for over a week at this point and really hasn't noticed any significant improvement. A couple days ago and had a whitish colored pus on it. She's still been applying the topical mupirocin ointment.  New diagnosis of impaired fasting glucose-A1c was 6.6, when here. She has been checking her glucose with her glucometer and most of her numbers have been under 120.  For her chronic back pain they actually increase her oxycodone to 10 mg and that has been helpful. She sees Dr. Lurene Shadow later today.   Past medical history, Surgical history, Family history not pertinant except as noted below, Social history, Allergies, and medications have been entered into the medical record, reviewed, and corrections made.   Review of Systems: No fevers, chills, night sweats, weight loss, chest pain, or shortness of breath.   Objective:    General: Well Developed, well nourished, and in no acute distress.  Neuro: Alert and oriented x3, extra-ocular muscles intact, sensation grossly intact.  HEENT: Normocephalic, atraumatic  Skin: Warm and dry, no rashes.He does have a fairly large approximately 3 x 5 mm open wound ROM able to actually see cartilage on her right ear. There is some scab around the edges and some erythema that looks some is dusky in appearance. Cardiac: Regular rate and rhythm, no murmurs rubs or gallops, no lower extremity edema.   Respiratory: Clear to auscultation bilaterally. Not using accessory muscles, speaking in full sentences.   Impression and Recommendations:   Hypertension-much improved. Is on her home blood pressures are at goal. We'll continue to follow this.  Impaired fasting glucose-follow-up in 2 months for repeat hemoglobin A1c. Continue work on diet exercise and continue to monitor glucose. She wanted to hold off on the metformin until we do the next A1c.  Right ear wound-concerning because I can now see cartilage. When I saw a couple weeks ago it just had a large scab on it but evidently that scab has been removed. Will refer to dermatology for further treatment. Make sure to complete antibiotic and just switch to plain Vaseline. Keep covered at night when she is lying on that ear. Wound culture collected.  Chronic back pain-on higher dose of oxycodone. Has follow-up later today. Long discussion about the risk of death increasing with being on a combination of chronic benzodiazepines as well as chronic narcotics. We'll start to wean the alprazolam.

## 2016-09-14 DIAGNOSIS — M25552 Pain in left hip: Secondary | ICD-10-CM | POA: Diagnosis not present

## 2016-09-14 DIAGNOSIS — R262 Difficulty in walking, not elsewhere classified: Secondary | ICD-10-CM | POA: Diagnosis not present

## 2016-09-14 DIAGNOSIS — M545 Low back pain: Secondary | ICD-10-CM | POA: Diagnosis not present

## 2016-09-14 DIAGNOSIS — M5432 Sciatica, left side: Secondary | ICD-10-CM | POA: Diagnosis not present

## 2016-09-14 DIAGNOSIS — H61021 Chronic perichondritis of right external ear: Secondary | ICD-10-CM | POA: Diagnosis not present

## 2016-09-15 ENCOUNTER — Encounter: Payer: Self-pay | Admitting: Family Medicine

## 2016-09-15 ENCOUNTER — Telehealth: Payer: Self-pay

## 2016-09-15 NOTE — Telephone Encounter (Signed)
ERROR. Rhonda Cunningham,CMA  

## 2016-09-16 ENCOUNTER — Telehealth: Payer: Self-pay

## 2016-09-16 LAB — WOUND CULTURE
GRAM STAIN: NONE SEEN
Gram Stain: NONE SEEN
ORGANISM ID, BACTERIA: NO GROWTH

## 2016-09-16 NOTE — Telephone Encounter (Signed)
Pt daughter Larene Beach called asking about moms ear culture results.  The results are back.  Mom is still complaining about her ear. She reports that she feels it's getting worse. Please advise.

## 2016-09-16 NOTE — Telephone Encounter (Signed)
See result note.  It just came back at lunch today.

## 2016-09-20 DIAGNOSIS — M25552 Pain in left hip: Secondary | ICD-10-CM | POA: Diagnosis not present

## 2016-09-20 DIAGNOSIS — R262 Difficulty in walking, not elsewhere classified: Secondary | ICD-10-CM | POA: Diagnosis not present

## 2016-09-20 DIAGNOSIS — M6281 Muscle weakness (generalized): Secondary | ICD-10-CM | POA: Diagnosis not present

## 2016-09-20 DIAGNOSIS — M545 Low back pain: Secondary | ICD-10-CM | POA: Diagnosis not present

## 2016-09-27 DIAGNOSIS — H61039 Chondritis of external ear, unspecified ear: Secondary | ICD-10-CM | POA: Diagnosis not present

## 2016-09-30 DIAGNOSIS — M4807 Spinal stenosis, lumbosacral region: Secondary | ICD-10-CM | POA: Diagnosis not present

## 2016-09-30 DIAGNOSIS — Z9889 Other specified postprocedural states: Secondary | ICD-10-CM | POA: Diagnosis not present

## 2016-09-30 DIAGNOSIS — M48061 Spinal stenosis, lumbar region without neurogenic claudication: Secondary | ICD-10-CM | POA: Diagnosis not present

## 2016-09-30 DIAGNOSIS — M5416 Radiculopathy, lumbar region: Secondary | ICD-10-CM | POA: Diagnosis not present

## 2016-09-30 DIAGNOSIS — K219 Gastro-esophageal reflux disease without esophagitis: Secondary | ICD-10-CM | POA: Diagnosis not present

## 2016-09-30 DIAGNOSIS — Z79899 Other long term (current) drug therapy: Secondary | ICD-10-CM | POA: Diagnosis not present

## 2016-09-30 DIAGNOSIS — I10 Essential (primary) hypertension: Secondary | ICD-10-CM | POA: Diagnosis not present

## 2016-09-30 DIAGNOSIS — E11618 Type 2 diabetes mellitus with other diabetic arthropathy: Secondary | ICD-10-CM | POA: Diagnosis not present

## 2016-09-30 DIAGNOSIS — F419 Anxiety disorder, unspecified: Secondary | ICD-10-CM | POA: Diagnosis not present

## 2016-09-30 DIAGNOSIS — Z791 Long term (current) use of non-steroidal anti-inflammatories (NSAID): Secondary | ICD-10-CM | POA: Diagnosis not present

## 2016-09-30 DIAGNOSIS — M5417 Radiculopathy, lumbosacral region: Secondary | ICD-10-CM | POA: Diagnosis not present

## 2016-09-30 DIAGNOSIS — Z885 Allergy status to narcotic agent status: Secondary | ICD-10-CM | POA: Diagnosis not present

## 2016-10-03 ENCOUNTER — Telehealth: Payer: Self-pay | Admitting: Family Medicine

## 2016-10-03 NOTE — Telephone Encounter (Signed)
Agree with above.  Catherine Metheney, MD  

## 2016-10-03 NOTE — Telephone Encounter (Signed)
Received phone call from Pt's daughter stating Pt did have her back surgery on Friday 09/30/16 with [facility]. Pt tolerated procedure well and is back home. Since surgery Pt's BP has been running low so they have stopped her BP Rx's. Pt BP this am was 90/60. Advised to monitor BP BID and watch for any increase or any edema. If noticed, restart Rx's and/or come in to see PCP. Verbalized understanding. No further questions at this time. Pt has follow up next month with PCP.

## 2016-10-19 DIAGNOSIS — I748 Embolism and thrombosis of other arteries: Secondary | ICD-10-CM | POA: Diagnosis not present

## 2016-10-19 DIAGNOSIS — I708 Atherosclerosis of other arteries: Secondary | ICD-10-CM | POA: Insufficient documentation

## 2016-10-19 DIAGNOSIS — I728 Aneurysm of other specified arteries: Secondary | ICD-10-CM | POA: Diagnosis not present

## 2016-10-20 ENCOUNTER — Telehealth: Payer: Self-pay | Admitting: *Deleted

## 2016-10-20 MED ORDER — AMLODIPINE BESYLATE 5 MG PO TABS: 5.0000 mg | ORAL_TABLET | Freq: Every day | ORAL | 3 refills | Status: DC

## 2016-10-20 NOTE — Telephone Encounter (Signed)
Pt given recommendations and transferred to scheduling to make appt.Audelia Hives [place]

## 2016-10-20 NOTE — Telephone Encounter (Signed)
Add amlodipine. This wound is a really good job particularly on the bottom number but will reduce the top number as well. We did have her come in for nurse visit in one week if she's able to for blood pressure check. I know she had her back surgery recently. Just really make sure watching salt intake and drinking plenty of water.  Beatrice Lecher, MD

## 2016-10-20 NOTE — Telephone Encounter (Signed)
Pt lvm stating that her bp has been running high. She is really concerned about her dystolic number has been going up and would like to know what she should do?   Will fwd to pcp for advice about any changes that she should make with regards to her bp med.Jasmine Fitzgerald [place]

## 2016-10-21 DIAGNOSIS — F419 Anxiety disorder, unspecified: Secondary | ICD-10-CM | POA: Diagnosis not present

## 2016-10-21 DIAGNOSIS — H65191 Other acute nonsuppurative otitis media, right ear: Secondary | ICD-10-CM | POA: Diagnosis not present

## 2016-11-01 ENCOUNTER — Telehealth: Payer: Self-pay

## 2016-11-01 NOTE — Telephone Encounter (Signed)
Pt has canceled her nurse visit appointment on Thursday for bp check.  She is coming to see [facility] tomorrow for a cough. Just an FYI.

## 2016-11-02 ENCOUNTER — Ambulatory Visit (INDEPENDENT_AMBULATORY_CARE_PROVIDER_SITE_OTHER): Payer: Medicare Other | Admitting: Physician Assistant

## 2016-11-02 ENCOUNTER — Encounter: Payer: Self-pay | Admitting: Physician Assistant

## 2016-11-02 VITALS — BP 130/75 | HR 88 | Temp 97.7°F | Ht 62.5 in | Wt 170.0 lb

## 2016-11-02 DIAGNOSIS — R05 Cough: Secondary | ICD-10-CM

## 2016-11-02 DIAGNOSIS — R058 Other specified cough: Secondary | ICD-10-CM

## 2016-11-02 DIAGNOSIS — I10 Essential (primary) hypertension: Secondary | ICD-10-CM

## 2016-11-02 DIAGNOSIS — H61009 Unspecified perichondritis of external ear, unspecified ear: Secondary | ICD-10-CM | POA: Diagnosis not present

## 2016-11-02 MED ORDER — IPRATROPIUM BROMIDE 0.06 % NA SOLN: 2.0000 | Freq: Four times a day (QID) | NASAL | 5 refills | Status: DC

## 2016-11-02 MED ORDER — LOSARTAN POTASSIUM-HCTZ 50-12.5 MG PO TABS: 1.0000 | ORAL_TABLET | Freq: Every day | ORAL | 0 refills | Status: DC

## 2016-11-02 MED ORDER — PREDNISONE 50 MG PO TABS: ORAL_TABLET | ORAL | 0 refills | Status: DC

## 2016-11-02 NOTE — Progress Notes (Signed)
   Subjective:    Patient ID: Jasmine Fitzgerald, female    DOB: November 21, 1945, 71 y.o.   MRN: [id number]  HPI  Patient is a 71yo female presenting with a dry cough, congestion, malaise, and PND for the past 14 days.  Patient reports her cough progressed from a productive cough to a dry cough and her nasal drainage is now clear.  Patient denies fevers, chills, sinus pressure, body aches, sore throat or ear pain.  Patient reports she tried Mucinex DM, a nightly vaporizer, Vick's vapor rub, alka Selkers plus with minimal relief.  Delsym DM has recently improved her cough.  Patient was treated with Amoxicillin 14 days ago for asymptomatic AOM, and she does not report any ear pain today. She states her congestion and cough have not resolved.  Patient reports she is having a procedure in 2 days for dilation of her SMA, and would like to be feeling better or will postpone her procedure.              Patient is being treated for her hypertension.  Patient needs a refill of her Losartan-HCTZ.  She has an appointment with her PCP November 14, 2016, but she will run out of refills.  She is doing well on this dose but reports some low blood pressure readings at times around 90's/70's and then some higher readings 160's/90's. Denies any CP, palpitations headaches, dizziness.     Review of Systems  Constitutional: Positive for fatigue. Negative for activity change, appetite change, chills, diaphoresis, fever and unexpected weight change.  HENT: Positive for congestion, postnasal drip and rhinorrhea. Negative for dental problem, drooling, ear discharge, ear pain, facial swelling, hearing loss, mouth sores, nosebleeds, sinus pain, sinus pressure, sneezing, sore throat, tinnitus, trouble swallowing and voice change.   Eyes: Negative.   Respiratory: Negative.   Cardiovascular: Negative.   Gastrointestinal: Negative.   Endocrine: Negative.   Genitourinary: Negative.   Musculoskeletal: Negative.   Skin: Negative.    Allergic/Immunologic: Negative.   Neurological: Negative.   Hematological: Negative.   Psychiatric/Behavioral: Negative.        Objective:   Physical Exam  Constitutional: She appears well-developed and well-nourished.  HENT:  Head: Normocephalic and atraumatic.  Mouth/Throat: No oropharyngeal exudate.  Nose: Evidence of postnasal drip.     Eyes: Conjunctivae are normal. Right eye exhibits no discharge. Left eye exhibits no discharge.  Cardiovascular: Normal rate, regular rhythm and normal heart sounds.   Pulmonary/Chest: Effort normal and breath sounds normal. She has no wheezes.  Lymphadenopathy:    She has no cervical adenopathy.          Assessment & Plan:  Marland KitchenMarland KitchenDiane was seen today for cough and sinus drainage.  Diagnoses and all orders for this visit:  Post-viral cough syndrome -     predniSONE (DELTASONE) 50 MG tablet; Take one tablet for 5 days. -     ipratropium (ATROVENT) 0.06 % nasal spray; Place 2 sprays into both nostrils 4 (four) times daily.  Essential hypertension, benign -     losartan-hydrochlorothiazide (HYZAAR) 50-12.5 MG tablet; Take 1 tablet by mouth daily.   Discussed symptomatic care with patient regarding cough. May call back if symptoms worsen and will consider 2nd round of abx.   2nd recheck BP looked great. Stay on same doses. Refilled for one month. Keep appt with PCP.

## 2016-11-03 ENCOUNTER — Ambulatory Visit: Payer: Medicare Other

## 2016-11-11 DIAGNOSIS — Z885 Allergy status to narcotic agent status: Secondary | ICD-10-CM | POA: Diagnosis not present

## 2016-11-11 DIAGNOSIS — K219 Gastro-esophageal reflux disease without esophagitis: Secondary | ICD-10-CM | POA: Diagnosis not present

## 2016-11-11 DIAGNOSIS — I10 Essential (primary) hypertension: Secondary | ICD-10-CM | POA: Diagnosis not present

## 2016-11-11 DIAGNOSIS — I748 Embolism and thrombosis of other arteries: Secondary | ICD-10-CM | POA: Diagnosis not present

## 2016-11-11 DIAGNOSIS — I728 Aneurysm of other specified arteries: Secondary | ICD-10-CM | POA: Diagnosis not present

## 2016-11-11 DIAGNOSIS — Z79899 Other long term (current) drug therapy: Secondary | ICD-10-CM | POA: Diagnosis not present

## 2016-11-11 DIAGNOSIS — I774 Celiac artery compression syndrome: Secondary | ICD-10-CM | POA: Diagnosis not present

## 2016-11-11 DIAGNOSIS — E119 Type 2 diabetes mellitus without complications: Secondary | ICD-10-CM | POA: Diagnosis not present

## 2016-11-11 DIAGNOSIS — F419 Anxiety disorder, unspecified: Secondary | ICD-10-CM | POA: Diagnosis not present

## 2016-11-14 ENCOUNTER — Ambulatory Visit (INDEPENDENT_AMBULATORY_CARE_PROVIDER_SITE_OTHER): Payer: Medicare Other | Admitting: Family Medicine

## 2016-11-14 ENCOUNTER — Encounter: Payer: Self-pay | Admitting: Family Medicine

## 2016-11-14 VITALS — BP 123/66 | HR 83 | Wt 170.0 lb

## 2016-11-14 DIAGNOSIS — E119 Type 2 diabetes mellitus without complications: Secondary | ICD-10-CM

## 2016-11-14 DIAGNOSIS — J0100 Acute maxillary sinusitis, unspecified: Secondary | ICD-10-CM | POA: Diagnosis not present

## 2016-11-14 DIAGNOSIS — I10 Essential (primary) hypertension: Secondary | ICD-10-CM | POA: Diagnosis not present

## 2016-11-14 MED ORDER — LOSARTAN POTASSIUM-HCTZ 50-12.5 MG PO TABS: 1.0000 | ORAL_TABLET | Freq: Every day | ORAL | 1 refills | Status: DC

## 2016-11-14 MED ORDER — AZITHROMYCIN 250 MG PO TABS: ORAL_TABLET | ORAL | 0 refills | Status: AC

## 2016-11-14 NOTE — Progress Notes (Signed)
Subjective:    CC: DM, HTN  HPI: Diabetes - no hypoglycemic events. No wounds or sores that are not healing well. No increased thirst or urination. Checking glucose at home. Taking medications as prescribed without any side effects.  Hypertension- Pt denies chest pain, SOB, dizziness, or heart palpitations.  Taking meds as directed w/o problems.  Denies medication side effects.    She has also had sinus congestion and cough x 3-4 weeks. Says went to UC initially and they gave her amox for an ear infection. She then came into our office and was seen was given prednisone. She says she still is having a lot of yellow drainage from her right sinus cavity and still has a persistent cough. No fevers chills or sweats. She feels like her ear is better.  Past medical history, Surgical history, Family history not pertinant except as noted below, Social history, Allergies, and medications have been entered into the medical record, reviewed, and corrections made.   Review of Systems: No fevers, chills, night sweats, weight loss, chest pain, or shortness of breath.   Objective:    General: Well Developed, well nourished, and in no acute distress.  Neuro: Alert and oriented x3, extra-ocular muscles intact, sensation grossly intact.  HEENT: Normocephalic, atraumatic, Oropharynx is clear, TMs and clear canals are clear bilaterally. No significant cervical lymphadenopathy.  Skin: Warm and dry, no rashes. Cardiac: Regular rate and rhythm, no murmurs rubs or gallops, no lower extremity edema.  Respiratory: Clear to auscultation bilaterally. Not using accessory muscles, speaking in full sentences.   Impression and Recommendations:    DM - Well controlled. Continue current regimen. Follow up in  6 months.    HTN - Well controlled. Continue current regimen. Follow up in  6 mo.   Acute sinusitis, right-we'll treat with azithromycin. If not better or getting worse in the next week if the call.

## 2016-11-23 DIAGNOSIS — I728 Aneurysm of other specified arteries: Secondary | ICD-10-CM | POA: Diagnosis not present

## 2016-11-23 DIAGNOSIS — I748 Embolism and thrombosis of other arteries: Secondary | ICD-10-CM | POA: Diagnosis not present

## 2016-12-05 ENCOUNTER — Ambulatory Visit (INDEPENDENT_AMBULATORY_CARE_PROVIDER_SITE_OTHER): Payer: Medicare Other | Admitting: Family Medicine

## 2016-12-05 VITALS — BP 120/62 | HR 85 | Ht 61.75 in | Wt 176.4 lb

## 2016-12-05 DIAGNOSIS — E119 Type 2 diabetes mellitus without complications: Secondary | ICD-10-CM

## 2016-12-05 LAB — POCT GLYCOSYLATED HEMOGLOBIN (HGB A1C): HEMOGLOBIN A1C: 6.2

## 2016-12-05 NOTE — Progress Notes (Signed)
Diabetes-hemoglobin A1c looks fantastic today. Significant improvement from previous. Follow-up in 3-4 months. Call if any problems.  Beatrice Lecher, MD

## 2016-12-05 NOTE — Progress Notes (Signed)
   Subjective:    Patient ID: Jasmine Fitzgerald, female    DOB: 01/09/1946, 71 y.o.   MRN: [id number]  HPI Pt is here for a hemoglobin A1C. Pt's A1C is 6.2%. PT denies chest pain, or shortness of breath.   Review of Systems  Respiratory: Negative for shortness of breath.   Cardiovascular: Negative for chest pain.  Neurological: Negative for dizziness.       Objective:   Physical Exam        Assessment & Plan:  Pt advised to schedule a diabetes  follow-up in 3 months.

## 2016-12-13 DIAGNOSIS — L299 Pruritus, unspecified: Secondary | ICD-10-CM | POA: Diagnosis not present

## 2016-12-13 DIAGNOSIS — L601 Onycholysis: Secondary | ICD-10-CM | POA: Diagnosis not present

## 2016-12-13 DIAGNOSIS — Z85828 Personal history of other malignant neoplasm of skin: Secondary | ICD-10-CM | POA: Diagnosis not present

## 2016-12-13 DIAGNOSIS — L309 Dermatitis, unspecified: Secondary | ICD-10-CM | POA: Diagnosis not present

## 2016-12-13 DIAGNOSIS — L209 Atopic dermatitis, unspecified: Secondary | ICD-10-CM | POA: Diagnosis not present

## 2016-12-13 DIAGNOSIS — L821 Other seborrheic keratosis: Secondary | ICD-10-CM | POA: Diagnosis not present

## 2016-12-13 DIAGNOSIS — L568 Other specified acute skin changes due to ultraviolet radiation: Secondary | ICD-10-CM | POA: Diagnosis not present

## 2016-12-13 DIAGNOSIS — Z872 Personal history of diseases of the skin and subcutaneous tissue: Secondary | ICD-10-CM | POA: Diagnosis not present

## 2016-12-13 DIAGNOSIS — D229 Melanocytic nevi, unspecified: Secondary | ICD-10-CM | POA: Diagnosis not present

## 2016-12-13 DIAGNOSIS — H61001 Unspecified perichondritis of right external ear: Secondary | ICD-10-CM | POA: Diagnosis not present

## 2016-12-16 DIAGNOSIS — R7303 Prediabetes: Secondary | ICD-10-CM | POA: Diagnosis present

## 2016-12-16 DIAGNOSIS — K219 Gastro-esophageal reflux disease without esophagitis: Secondary | ICD-10-CM | POA: Diagnosis present

## 2016-12-16 DIAGNOSIS — I774 Celiac artery compression syndrome: Secondary | ICD-10-CM | POA: Diagnosis not present

## 2016-12-16 DIAGNOSIS — G4700 Insomnia, unspecified: Secondary | ICD-10-CM | POA: Diagnosis present

## 2016-12-16 DIAGNOSIS — I728 Aneurysm of other specified arteries: Secondary | ICD-10-CM | POA: Diagnosis not present

## 2016-12-16 DIAGNOSIS — I10 Essential (primary) hypertension: Secondary | ICD-10-CM | POA: Diagnosis present

## 2016-12-16 DIAGNOSIS — F419 Anxiety disorder, unspecified: Secondary | ICD-10-CM | POA: Diagnosis present

## 2016-12-16 DIAGNOSIS — M5116 Intervertebral disc disorders with radiculopathy, lumbar region: Secondary | ICD-10-CM | POA: Diagnosis present

## 2016-12-16 DIAGNOSIS — I708 Atherosclerosis of other arteries: Secondary | ICD-10-CM | POA: Diagnosis present

## 2016-12-16 DIAGNOSIS — Z885 Allergy status to narcotic agent status: Secondary | ICD-10-CM | POA: Diagnosis not present

## 2016-12-16 DIAGNOSIS — I748 Embolism and thrombosis of other arteries: Secondary | ICD-10-CM | POA: Diagnosis not present

## 2016-12-16 DIAGNOSIS — E669 Obesity, unspecified: Secondary | ICD-10-CM | POA: Diagnosis present

## 2016-12-16 DIAGNOSIS — Z6835 Body mass index (BMI) 35.0-35.9, adult: Secondary | ICD-10-CM | POA: Diagnosis not present

## 2016-12-16 HISTORY — PX: ABDOMINAL SURGERY: SHX537

## 2017-01-04 DIAGNOSIS — Z48812 Encounter for surgical aftercare following surgery on the circulatory system: Secondary | ICD-10-CM | POA: Diagnosis not present

## 2017-01-04 DIAGNOSIS — I728 Aneurysm of other specified arteries: Secondary | ICD-10-CM | POA: Diagnosis not present

## 2017-01-04 DIAGNOSIS — I748 Embolism and thrombosis of other arteries: Secondary | ICD-10-CM | POA: Diagnosis not present

## 2017-01-09 DIAGNOSIS — I728 Aneurysm of other specified arteries: Secondary | ICD-10-CM | POA: Diagnosis not present

## 2017-01-09 DIAGNOSIS — I748 Embolism and thrombosis of other arteries: Secondary | ICD-10-CM | POA: Diagnosis not present

## 2017-01-09 DIAGNOSIS — Z48812 Encounter for surgical aftercare following surgery on the circulatory system: Secondary | ICD-10-CM | POA: Diagnosis not present

## 2017-01-11 DIAGNOSIS — I728 Aneurysm of other specified arteries: Secondary | ICD-10-CM | POA: Diagnosis not present

## 2017-01-11 DIAGNOSIS — Z48812 Encounter for surgical aftercare following surgery on the circulatory system: Secondary | ICD-10-CM | POA: Diagnosis not present

## 2017-01-11 DIAGNOSIS — I748 Embolism and thrombosis of other arteries: Secondary | ICD-10-CM | POA: Diagnosis not present

## 2017-02-09 DIAGNOSIS — M5416 Radiculopathy, lumbar region: Secondary | ICD-10-CM | POA: Diagnosis not present

## 2017-02-09 DIAGNOSIS — M545 Low back pain: Secondary | ICD-10-CM | POA: Diagnosis not present

## 2017-02-20 DIAGNOSIS — R6889 Other general symptoms and signs: Secondary | ICD-10-CM | POA: Diagnosis not present

## 2017-02-20 DIAGNOSIS — M5416 Radiculopathy, lumbar region: Secondary | ICD-10-CM | POA: Diagnosis not present

## 2017-02-20 DIAGNOSIS — M545 Low back pain: Secondary | ICD-10-CM | POA: Diagnosis not present

## 2017-02-20 DIAGNOSIS — M25552 Pain in left hip: Secondary | ICD-10-CM | POA: Diagnosis not present

## 2017-03-02 DIAGNOSIS — M7062 Trochanteric bursitis, left hip: Secondary | ICD-10-CM | POA: Diagnosis not present

## 2017-03-02 DIAGNOSIS — M545 Low back pain: Secondary | ICD-10-CM | POA: Diagnosis not present

## 2017-03-02 DIAGNOSIS — M7061 Trochanteric bursitis, right hip: Secondary | ICD-10-CM | POA: Diagnosis not present

## 2017-03-02 DIAGNOSIS — M549 Dorsalgia, unspecified: Secondary | ICD-10-CM | POA: Diagnosis not present

## 2017-03-06 ENCOUNTER — Ambulatory Visit (INDEPENDENT_AMBULATORY_CARE_PROVIDER_SITE_OTHER): Payer: Medicare Other | Admitting: Family Medicine

## 2017-03-06 ENCOUNTER — Encounter: Payer: Self-pay | Admitting: Family Medicine

## 2017-03-06 VITALS — BP 123/70 | HR 86 | Ht 62.0 in | Wt 160.0 lb

## 2017-03-06 DIAGNOSIS — F5101 Primary insomnia: Secondary | ICD-10-CM

## 2017-03-06 DIAGNOSIS — I10 Essential (primary) hypertension: Secondary | ICD-10-CM

## 2017-03-06 DIAGNOSIS — E119 Type 2 diabetes mellitus without complications: Secondary | ICD-10-CM | POA: Diagnosis not present

## 2017-03-06 DIAGNOSIS — Z1231 Encounter for screening mammogram for malignant neoplasm of breast: Secondary | ICD-10-CM | POA: Diagnosis not present

## 2017-03-06 LAB — POCT UA - MICROALBUMIN
Albumin/Creatinine Ratio, Urine, POC: <30
Creatinine, POC: 100 mg/dL
Microalbumin Ur, POC: 10 mg/L

## 2017-03-06 LAB — POCT GLYCOSYLATED HEMOGLOBIN (HGB A1C): HEMOGLOBIN A1C: 6.6

## 2017-03-06 MED ORDER — ALPRAZOLAM 0.5 MG PO TABS: 0.2500 mg | ORAL_TABLET | Freq: Every day | ORAL | 1 refills | Status: DC | PRN

## 2017-03-06 MED ORDER — ALPRAZOLAM 0.25 MG PO TABS: 0.2500 mg | ORAL_TABLET | Freq: Every evening | ORAL | 1 refills | Status: DC | PRN

## 2017-03-06 NOTE — Progress Notes (Signed)
Subjective:    CC: DM  HPI: Diabetes - no hypoglycemic events. No wounds or sores that are not healing well. No increased thirst or urination. Checking glucose at home. Taking medications as prescribed without any side effects.He did bring in home glucose log. Most of her blood sugars are under 120, consistently.She is really working hard to try to stay diet controlled and not take medication.  Hypertension- Pt denies chest pain, SOB, dizziness, or heart palpitations.  Taking meds as directed w/o problems.  Denies medication side effects.    Insomnia-she would like to have a refill her alprazolam. We gave her a few times back in November and that was helpful for her. She did try the trazodone but unfortunately it caused excess sedation in the mornings and she really didn't tolerate it well. She wondered if she could still use the Xanax but just use it as needed as long as she's not using it nightly.   Past medical history, Surgical history, Family history not pertinant except as noted below, Social history, Allergies, and medications have been entered into the medical record, reviewed, and corrections made.   Review of Systems: No fevers, chills, night sweats, weight loss, chest pain, or shortness of breath.   Objective:    General: Well Developed, well nourished, and in no acute distress.  Neuro: Alert and oriented x3, extra-ocular muscles intact, sensation grossly intact.  HEENT: Normocephalic, atraumatic  Skin: Warm and dry, no rashes. Cardiac: Regular rate and rhythm, no murmurs rubs or gallops, no lower extremity edema.  Respiratory: Clear to auscultation bilaterally. Not using accessory muscles, speaking in full sentences.   Impression and Recommendations:   DM- Controlled. Hemoccult A1c of 6.6 today which is fantastic. Continue current regimen. Follow-up in 3-4 months.Foot exam done today.  Will call Dr. Jethro Bolus office for las eye exam. Burnis Medin try to collect urine micral albumin  today. We discussed of her hemoglobin A1c is greater than 6.51 I see her back in what would recommend that she consider starting medication.  HTN - Well controlled. Continue current regimen. Follow up in  3-4 months.   Insomnia-we'll refill her alprazolam for 30 tabs. Just again reminded her to use it sparingly.  Reminded her that she is due for her mammogram. Encouraged her schedule her convenience.

## 2017-03-07 LAB — COMPLETE METABOLIC PANEL WITH GFR
ALK PHOS: 34 U/L (ref 33–130)
ALT: 9 U/L (ref 6–29)
AST: 13 U/L (ref 10–35)
Albumin: 4.2 g/dL (ref 3.6–5.1)
BILIRUBIN TOTAL: 0.8 mg/dL (ref 0.2–1.2)
BUN: 19 mg/dL (ref 7–25)
CALCIUM: 9.6 mg/dL (ref 8.6–10.4)
CO2: 25 mmol/L (ref 20–31)
Chloride: 103 mmol/L (ref 98–110)
Creat: 0.64 mg/dL (ref 0.50–0.99)
Glucose, Bld: 105 mg/dL — ABNORMAL HIGH (ref 65–99)
Potassium: 4.2 mmol/L (ref 3.5–5.3)
Sodium: 139 mmol/L (ref 135–146)
TOTAL PROTEIN: 6.8 g/dL (ref 6.1–8.1)

## 2017-03-14 ENCOUNTER — Encounter: Payer: Self-pay | Admitting: Family Medicine

## 2017-03-14 ENCOUNTER — Ambulatory Visit (INDEPENDENT_AMBULATORY_CARE_PROVIDER_SITE_OTHER): Payer: Medicare Other | Admitting: Family Medicine

## 2017-03-14 VITALS — BP 131/62 | HR 71 | Ht 62.0 in | Wt 159.0 lb

## 2017-03-14 DIAGNOSIS — E559 Vitamin D deficiency, unspecified: Secondary | ICD-10-CM | POA: Diagnosis not present

## 2017-03-14 DIAGNOSIS — E119 Type 2 diabetes mellitus without complications: Secondary | ICD-10-CM

## 2017-03-14 DIAGNOSIS — R7989 Other specified abnormal findings of blood chemistry: Secondary | ICD-10-CM

## 2017-03-14 DIAGNOSIS — Z974 Presence of external hearing-aid: Secondary | ICD-10-CM | POA: Diagnosis not present

## 2017-03-14 DIAGNOSIS — Z23 Encounter for immunization: Secondary | ICD-10-CM

## 2017-03-14 DIAGNOSIS — Z0000 Encounter for general adult medical examination without abnormal findings: Secondary | ICD-10-CM | POA: Diagnosis not present

## 2017-03-14 NOTE — Progress Notes (Signed)
Subjective:   Jasmine Fitzgerald is a 71 y.o. female who presents for Medicare Annual (Subsequent) preventive examination.  Review of Systems:  Comprehensive ROS is negative.        Objective:     Vitals: BP 131/62   Pulse 71   Ht _0  (1.575 m)   Wt 159 lb (72.1 kg)   SpO2 100%   BMI 29.08 kg/m   Body mass index is 29.08 kg/m.   Tobacco History  Smoking Status  . Never Smoker  Smokeless Tobacco  . Never Used     Counseling given: Not Answered   Past Medical History:  Diagnosis Date  . Carpal tunnel syndrome   . Colon polyp   . Kidney stones   . Renal cyst    benign   Past Surgical History:  Procedure Laterality Date  . ABDOMINAL SURGERY  12/16/2016   Pancreaticoduodenal aortic aneurysm repair  . BREAST REDUCTION SURGERY  2013  . HERNIA REPAIR  1996  . KIDNEY STONE SURGERY  2015  . KNEE ARTHROSCOPY    . TONSILLECTOMY  1957  . TRIGGER FINGER RELEASE  07/2015  . UPPER GASTROINTESTINAL ENDOSCOPY     Family History  Problem Relation Age of Onset  . Heart failure Mother   . Heart failure Father   . Heart attack Father   . Diabetes Brother   . Cancer Maternal Aunt   . Cancer Maternal Uncle   . Ovarian cancer Unknown   . Lung cancer Unknown   . Throat cancer Unknown   . Stroke Maternal Grandmother    History  Sexual Activity  . Sexual activity: Not Currently    Outpatient Encounter Prescriptions as of 03/14/2017  Medication Sig  . ALPRAZolam (XANAX) 0.25 MG tablet Take 1 tablet (0.25 mg total) by mouth at bedtime as needed for sleep.  Marland Kitchen amLODipine (NORVASC) 5 MG tablet Take 1 tablet (5 mg total) by mouth daily.  Marland Kitchen aspirin 81 MG chewable tablet Chew 81 mg by mouth daily.  . Biotin 5000 MCG CAPS Take 1 capsule by mouth daily.  . blood glucose meter kit and supplies KIT Dispense based on patient and insurance preference. Use up to once daily as directed. (FOR ICD-9 250.00, 250.01).  . celecoxib (CELEBREX) 200 MG capsule Take 200 mg by mouth 2 (two) times  daily.  . cyclobenzaprine (FLEXERIL) 10 MG tablet Take 10 mg by mouth 3 (three) times daily as needed for muscle spasms.  Marland Kitchen losartan-hydrochlorothiazide (HYZAAR) 50-12.5 MG tablet Take 1 tablet by mouth daily.  Marland Kitchen omeprazole (PRILOSEC) 20 MG capsule   . traZODone (DESYREL) 50 MG tablet Take 1.5-2 tablets (75-100 mg total) by mouth at bedtime as needed for sleep. (Patient taking differently: Take 75-100 mg by mouth at bedtime as needed for sleep (Taking 3 tablets at bedtime). )   No facility-administered encounter medications on file as of 03/14/2017.     Activities of Daily Living In your present state of health, do you have any difficulty performing the following activities: 03/14/2017  Vision? N  Difficulty concentrating or making decisions? N  Walking or climbing stairs? N  Dressing or bathing? N  Some recent data might be hidden    Patient Care Team: Hali Marry, MD as PCP - General (Family Medicine) Dinah Beers, MD as Referring Physician (Vascular Surgery)    Assessment:    Medicare Wellness Exam   Physical Exam  Constitutional: She is oriented to person, place, and time. She appears well-developed  and well-nourished.  HENT:  Head: Normocephalic and atraumatic.  Right Ear: External ear normal.  Left Ear: External ear normal.  Nose: Nose normal.  Mouth/Throat: Oropharynx is clear and moist.  TMs and canals are clear.   Eyes: Conjunctivae and EOM are normal. Pupils are equal, round, and reactive to light.  Neck: Neck supple. No thyromegaly present.  Cardiovascular: Normal rate, regular rhythm and normal heart sounds.   Pulmonary/Chest: Effort normal and breath sounds normal. She has no wheezes.  Abdominal: Soft. Bowel sounds are normal.  Lymphadenopathy:    She has no cervical adenopathy.  Neurological: She is alert and oriented to person, place, and time.  Skin: Skin is warm and dry.  Psychiatric: She has a normal mood and affect.     Exercise  Activities and Dietary recommendations Current Exercise Habits: The patient does not participate in regular exercise at present, Exercise limited by: cardiac condition(s)  Goals    None     Fall Risk Fall Risk  03/06/2017 02/15/2016  Falls in the past year? No No   Depression Screen PHQ 2/9 Scores 03/06/2017 02/15/2016  PHQ - 2 Score 0 1  PHQ- 9 Score - 8     Cognitive Function     6CIT Screen 03/14/2017  What Year? 0 points  What month? 0 points  What time? 0 points  Count back from 20 0 points  Months in reverse 0 points  Repeat phrase 0 points  Total Score 0    Immunization History  Administered Date(s) Administered  . Influenza Split 07/02/2015  . Influenza, Seasonal, Injecte, Preservative Fre 08/08/2013, 07/28/2014  . Influenza,inj,Quad PF,36+ Mos 08/08/2013, 07/02/2015, 07/18/2016  . Influenza,inj,quad, With Preservative 07/28/2014  . Pneumococcal Conjugate-13 02/02/2015  . Pneumococcal Polysaccharide-23 10/31/2012  . Tdap 09/11/2008, 10/31/2008  . Zoster 05/06/2009, 10/31/2012   Screening Tests Health Maintenance  Topic Date Due  . Hepatitis C Screening  [birth year]  . FOOT EXAM  08/12/1957  . OPHTHALMOLOGY EXAM  04/06/2017  . INFLUENZA VACCINE  05/31/2017  . HEMOGLOBIN A1C  09/06/2017  . MAMMOGRAM  02/09/2018  . TETANUS/TDAP  10/31/2018  . COLONOSCOPY  11/26/2024  . DEXA SCAN  Completed  . PNA vac Low Risk Adult  Completed      Plan:     I have personally reviewed and noted the following in the patient's chart:   . Medical and social history . Use of alcohol, tobacco or illicit drugs  . Current medications and supplements - list updated.   . Functional ability and status - keep up the exercise.  Walking regularly for exercise.   . Nutritional status . Physical activity . Advanced directives . List of other physicians - updated.  Marland Kitchen Hospitalizations, surgeries, and ER visits in previous 12 months . Vitals . Screenings to include cognitive,  depression, and falls - normal.  . Referrals and appointments  In addition, I have reviewed and discussed with patient certain preventive protocols, quality metrics, and best practice recommendations. A written personalized care plan for preventive services as well as general preventive health recommendations were provided to patient.     Juliauna Stueve, MD  03/14/2017

## 2017-03-14 NOTE — Patient Instructions (Addendum)
Health Maintenance for Postmenopausal Women Menopause is a normal process in which your reproductive ability comes to an end. This process happens gradually over a span of months to years, usually between the ages of 7 and 19. Menopause is complete when you have missed 12 consecutive menstrual periods. It is important to talk with your health care provider about some of the most common conditions that affect postmenopausal women, such as heart disease, cancer, and bone loss (osteoporosis). Adopting a healthy lifestyle and getting preventive care can help to promote your health and wellness. Those actions can also lower your chances of developing some of these common conditions. What should I know about menopause? During menopause, you may experience a number of symptoms, such as:  Moderate-to-severe hot flashes.  Night sweats.  Decrease in sex drive.  Mood swings.  Headaches.  Tiredness.  Irritability.  Memory problems.  Insomnia. Choosing to treat or not to treat menopausal changes is an individual decision that you make with your health care provider. What should I know about hormone replacement therapy and supplements? Hormone therapy products are effective for treating symptoms that are associated with menopause, such as hot flashes and night sweats. Hormone replacement carries certain risks, especially as you become older. If you are thinking about using estrogen or estrogen with progestin treatments, discuss the benefits and risks with your health care provider. What should I know about heart disease and stroke? Heart disease, heart attack, and stroke become more likely as you age. This may be due, in part, to the hormonal changes that your body experiences during menopause. These can affect how your body processes dietary fats, triglycerides, and cholesterol. Heart attack and stroke are both medical emergencies. There are many things that you can do to help prevent heart disease  and stroke:  Have your blood pressure checked at least every 1-2 years. High blood pressure causes heart disease and increases the risk of stroke.  If you are 68-40 years old, ask your health care provider if you should take aspirin to prevent a heart attack or a stroke.  Do not use any tobacco products, including cigarettes, chewing tobacco, or electronic cigarettes. If you need help quitting, ask your health care provider.  It is important to eat a healthy diet and maintain a healthy weight.  Be sure to include plenty of vegetables, fruits, low-fat dairy products, and lean protein.  Avoid eating foods that are high in solid fats, added sugars, or salt (sodium).  Get regular exercise. This is one of the most important things that you can do for your health.  Try to exercise for at least 150 minutes each week. The type of exercise that you do should increase your heart rate and make you sweat. This is known as moderate-intensity exercise.  Try to do strengthening exercises at least twice each week. Do these in addition to the moderate-intensity exercise.  Know your numbers.Ask your health care provider to check your cholesterol and your blood glucose. Continue to have your blood tested as directed by your health care provider. What should I know about cancer screening? There are several types of cancer. Take the following steps to reduce your risk and to catch any cancer development as early as possible. Breast Cancer  Practice breast self-awareness.  This means understanding how your breasts normally appear and feel.  It also means doing regular breast self-exams. Let your health care provider know about any changes, no matter how small.  If you are 40 or older,  have a clinician do a breast exam (clinical breast exam or CBE) every year. Depending on your age, family history, and medical history, it may be recommended that you also have a yearly breast X-ray (mammogram).  If you  have a family history of breast cancer, talk with your health care provider about genetic screening.  If you are at high risk for breast cancer, talk with your health care provider about having an MRI and a mammogram every year.  Breast cancer (BRCA) gene test is recommended for women who have family members with BRCA-related cancers. Results of the assessment will determine the need for genetic counseling and BRCA1 and for BRCA2 testing. BRCA-related cancers include these types:  Breast. This occurs in males or females.  Ovarian.  Tubal. This may also be called fallopian tube cancer.  Cancer of the abdominal or pelvic lining (peritoneal cancer).  Prostate.  Pancreatic. Cervical, Uterine, and Ovarian Cancer  Your health care provider may recommend that you be screened regularly for cancer of the pelvic organs. These include your ovaries, uterus, and vagina. This screening involves a pelvic exam, which includes checking for microscopic changes to the surface of your cervix (Pap test).  For women ages 21-65, health care providers may recommend a pelvic exam and a Pap test every three years. For women ages 23-65, they may recommend the Pap test and pelvic exam, combined with testing for human papilloma virus (HPV), every five years. Some types of HPV increase your risk of cervical cancer. Testing for HPV may also be done on women of any age who have unclear Pap test results.  Other health care providers may not recommend any screening for nonpregnant women who are considered low risk for pelvic cancer and have no symptoms. Ask your health care provider if a screening pelvic exam is right for you.  If you have had past treatment for cervical cancer or a condition that could lead to cancer, you need Pap tests and screening for cancer for at least 20 years after your treatment. If Pap tests have been discontinued for you, your risk factors (such as having a new sexual partner) need to be reassessed  to determine if you should start having screenings again. Some women have medical problems that increase the chance of getting cervical cancer. In these cases, your health care provider may recommend that you have screening and Pap tests more often.  If you have a family history of uterine cancer or ovarian cancer, talk with your health care provider about genetic screening.  If you have vaginal bleeding after reaching menopause, tell your health care provider.  There are currently no reliable tests available to screen for ovarian cancer. Lung Cancer  Lung cancer screening is recommended for adults 99-83 years old who are at high risk for lung cancer because of a history of smoking. A yearly low-dose CT scan of the lungs is recommended if you:  Currently smoke.  Have a history of at least 30 pack-years of smoking and you currently smoke or have quit within the past 15 years. A pack-year is smoking an average of one pack of cigarettes per day for one year. Yearly screening should:  Continue until it has been 15 years since you quit.  Stop if you develop a health problem that would prevent you from having lung cancer treatment. Colorectal Cancer  This type of cancer can be detected and can often be prevented.  Routine colorectal cancer screening usually begins at age 72 and continues  through age 75.  If you have risk factors for colon cancer, your health care provider may recommend that you be screened at an earlier age.  If you have a family history of colorectal cancer, talk with your health care provider about genetic screening.  Your health care provider may also recommend using home test kits to check for hidden blood in your stool.  A small camera at the end of a tube can be used to examine your colon directly (sigmoidoscopy or colonoscopy). This is done to check for the earliest forms of colorectal cancer.  Direct examination of the colon should be repeated every 5-10 years until  age 75. However, if early forms of precancerous polyps or small growths are found or if you have a family history or genetic risk for colorectal cancer, you may need to be screened more often. Skin Cancer  Check your skin from head to toe regularly.  Monitor any moles. Be sure to tell your health care provider:  About any new moles or changes in moles, especially if there is a change in a mole's shape or color.  If you have a mole that is larger than the size of a pencil eraser.  If any of your family members has a history of skin cancer, especially at a young age, talk with your health care provider about genetic screening.  Always use sunscreen. Apply sunscreen liberally and repeatedly throughout the day.  Whenever you are outside, protect yourself by wearing long sleeves, pants, a wide-brimmed hat, and sunglasses. What should I know about osteoporosis? Osteoporosis is a condition in which bone destruction happens more quickly than new bone creation. After menopause, you may be at an increased risk for osteoporosis. To help prevent osteoporosis or the bone fractures that can happen because of osteoporosis, the following is recommended:  If you are 19-50 years old, get at least 1,000 mg of calcium and at least 600 mg of vitamin D per day.  If you are older than age 50 but younger than age 70, get at least 1,200 mg of calcium and at least 600 mg of vitamin D per day.  If you are older than age 70, get at least 1,200 mg of calcium and at least 800 mg of vitamin D per day. Smoking and excessive alcohol intake increase the risk of osteoporosis. Eat foods that are rich in calcium and vitamin D, and do weight-bearing exercises several times each week as directed by your health care provider. What should I know about how menopause affects my mental health? Depression may occur at any age, but it is more common as you become older. Common symptoms of depression include:  Low or sad  mood.  Changes in sleep patterns.  Changes in appetite or eating patterns.  Feeling an overall lack of motivation or enjoyment of activities that you previously enjoyed.  Frequent crying spells. Talk with your health care provider if you think that you are experiencing depression. What should I know about immunizations? It is important that you get and maintain your immunizations. These include:  Tetanus, diphtheria, and pertussis (Tdap) booster vaccine.  Influenza every year before the flu season begins.  Pneumonia vaccine.  Shingles vaccine. Your health care provider may also recommend other immunizations. This information is not intended to replace advice given to you by your health care provider. Make sure you discuss any questions you have with your health care provider. Document Released: 12/09/2005 Document Revised: 05/06/2016 Document Reviewed: 07/21/2015 Elsevier Interactive Patient   Education  2017 [employer].

## 2017-03-14 NOTE — Addendum Note (Signed)
Addended by: Teddy Spike on: 03/14/2017 05:37 PM   Modules accepted: Orders

## 2017-03-15 ENCOUNTER — Other Ambulatory Visit: Payer: Self-pay | Admitting: Family Medicine

## 2017-03-16 DIAGNOSIS — Z1231 Encounter for screening mammogram for malignant neoplasm of breast: Secondary | ICD-10-CM | POA: Diagnosis not present

## 2017-03-16 DIAGNOSIS — M545 Low back pain: Secondary | ICD-10-CM | POA: Diagnosis not present

## 2017-03-16 LAB — HM MAMMOGRAPHY

## 2017-03-24 ENCOUNTER — Encounter: Payer: Self-pay | Admitting: Family Medicine

## 2017-05-26 ENCOUNTER — Encounter: Payer: Self-pay | Admitting: Physician Assistant

## 2017-05-26 ENCOUNTER — Ambulatory Visit (INDEPENDENT_AMBULATORY_CARE_PROVIDER_SITE_OTHER): Payer: Medicare Other | Admitting: Physician Assistant

## 2017-05-26 VITALS — BP 122/73 | HR 72 | Temp 98.2°F | Resp 18 | Wt 156.0 lb

## 2017-05-26 DIAGNOSIS — S81812A Laceration without foreign body, left lower leg, initial encounter: Secondary | ICD-10-CM | POA: Diagnosis not present

## 2017-05-26 NOTE — Patient Instructions (Signed)
Nonsutured Laceration Care A laceration is a cut that goes through all layers of the skin and extends into the tissue that is right under the skin. This type of cut is usually stitched up (sutured) or closed with tape (adhesive strips) or skin glue shortly after the injury happens. However, if the wound is dirty or if several hours pass before medical treatment is provided, it is likely that germs (bacteria) will enter the wound. Closing a laceration after bacteria have entered it increases the risk of infection. In these cases, your health care provider may leave the laceration open (nonsutured) and cover it with a bandage. This type of treatment helps prevent infection and allows the wound to heal from the deepest layer of tissue damage up to the surface. An open fracture is a type of injury that may involve nonsutured lacerations. An open fracture is a break in a bone that happens along with one or more lacerations through the skin that is near the fracture site. How to care for your nonsutured laceration  Take or apply over-the-counter and prescription medicines only as told by your health care provider.  If you were prescribed an antibiotic medicine, take or apply it as told by your health care provider. Do not stop using the antibiotic even if your condition improves.  Clean the wound one time each day or as told by your health care provider. ? Wash the wound with mild soap and water. ? Rinse the wound with water to remove all soap. ? Pat your wound dry with a clean towel. Do not rub the wound.  Do not inject anything into the wound unless your health care provider told you to.  Change any bandages (dressings) as told by your health care provider. This includes changing the dressing if it gets wet, dirty, or starts to smell bad.  Keep the dressing dry until your health care provider says it can be removed. Do not take baths, swim, or do anything that puts your wound underwater until your  health care provider approves.  Raise (elevate) the injured area above the level of your heart while you are sitting or lying down, if possible.  Do not scratch or pick at the wound.  Check your wound every day for signs of infection. Watch for: ? Redness, swelling, or pain. ? Fluid, blood, or pus.  Keep all follow-up visits as told by your health care provider. This is important. Contact a health care provider if:  You received a tetanus and shot and you have swelling, severe pain, redness, or bleeding at the injection site.  You have a fever.  Your pain is not controlled with medicine.  You have increased redness, swelling, or pain at the site of your wound.  You have fluid, blood, or pus coming from your wound.  You notice a bad smell coming from your wound or your dressing.  You notice something coming out of the wound, such as wood or glass.  You notice a change in the color of your skin near your wound.  You develop a new rash.  You need to change the dressing frequently due to fluid, blood, or pus draining from the wound.  You develop numbness around your wound. Get help right away if:  Your pain suddenly increases and is severe.  You develop severe swelling around the wound.  The wound is on your hand or foot and you cannot properly move a finger or toe.  The wound is on your hand  or foot and you notice that your fingers or toes look pale or bluish.  You have a red streak going away from your wound. This information is not intended to replace advice given to you by your health care provider. Make sure you discuss any questions you have with your health care provider. Document Released: 09/14/2006 Document Revised: 03/24/2016 Document Reviewed: 10/13/2014 Elsevier Interactive Patient Education  [employer].

## 2017-05-26 NOTE — Progress Notes (Signed)
HPI:                                                                Jasmine Fitzgerald is a 71 y.o. female who presents to [facility]: [facility] today for skin tear  Patient reports injuring left lower leg 1.5 weeks ago. She was removing her pantyhose by dragging the back of her opposite bare heel down the front of her left shin and tore the skin. Since that time she has noted tenderness and some serosanguinous drainage. Denies fever, chills, warmth, or severe pain. She reports history of sensitive skin and easy bruising. She has Type II DM controlled with diet and lifestyle. She is a nonsmoker.  Past Medical History:  Diagnosis Date  . Carpal tunnel syndrome   . Colon polyp   . Kidney stones   . Renal cyst    benign   Past Surgical History:  Procedure Laterality Date  . ABDOMINAL SURGERY  12/16/2016   Pancreaticoduodenal aortic aneurysm repair  . BREAST REDUCTION SURGERY  2013  . HERNIA REPAIR  1996  . KIDNEY STONE SURGERY  2015  . KNEE ARTHROSCOPY    . TONSILLECTOMY  1957  . TRIGGER FINGER RELEASE  07/2015  . UPPER GASTROINTESTINAL ENDOSCOPY     Social History  Substance Use Topics  . Smoking status: Never Smoker  . Smokeless tobacco: Never Used  . Alcohol use No   family history includes Cancer in her maternal aunt and maternal uncle; Diabetes in her brother; Heart attack in her father; Heart failure in her father and mother; Lung cancer in her unknown relative; Ovarian cancer in her unknown relative; Stroke in her maternal grandmother; Throat cancer in her unknown relative.  ROS: negative except as noted in the HPI  Medications: Current Outpatient Prescriptions  Medication Sig Dispense Refill  . ALPRAZolam (XANAX) 0.25 MG tablet Take 1 tablet (0.25 mg total) by mouth at bedtime as needed for sleep. 30 tablet 1  . amLODipine (NORVASC) 5 MG tablet TAKE ONE TABLET BY MOUTH EVERY DAY 90 tablet 1  . aspirin 81 MG chewable tablet Chew 81 mg by  mouth daily.    . Biotin 5000 MCG CAPS Take 1 capsule by mouth daily.    . blood glucose meter kit and supplies KIT Dispense based on patient and insurance preference. Use up to once daily as directed. (FOR ICD-9 250.00, 250.01). 1 each PRN  . celecoxib (CELEBREX) 200 MG capsule Take 200 mg by mouth 2 (two) times daily.    . cyclobenzaprine (FLEXERIL) 10 MG tablet Take 10 mg by mouth 3 (three) times daily as needed for muscle spasms.    Marland Kitchen losartan-hydrochlorothiazide (HYZAAR) 50-12.5 MG tablet Take 1 tablet by mouth daily. 90 tablet 1  . omeprazole (PRILOSEC) 20 MG capsule     . traZODone (DESYREL) 50 MG tablet Take 1.5-2 tablets (75-100 mg total) by mouth at bedtime as needed for sleep. (Patient taking differently: Take 75-100 mg by mouth at bedtime as needed for sleep (Taking 3 tablets at bedtime). ) 60 tablet 3   No current facility-administered medications for this visit.    Allergies  Allergen Reactions  . Codeine Nausea And Vomiting and Other (See Comments)    GI Upset  .  Hydrocodone Nausea And Vomiting       Objective:  BP 122/73   Pulse 72   Temp 98.2 F (36.8 C) (Oral)   Resp 18   Wt 156 lb (70.8 kg)   SpO2 99%   BMI 28.53 kg/m  Gen: well-groomed, cooperative, not ill-appearing, no distress HEENT: normal conjunctiva, trachea midline Pulm: Normal work of breathing, normal phonation Neuro: alert and oriented x 3, no tremor MSK: normal gait and station, no peripheral edema Skin: warm, dry, intact; left lower extremity on the distal anterior tibia there is a ecchymosis and a 2.5 cm x 4 cm healing skin tear, no purulence, no bleeding    No results found for this or any previous visit (from the past 72 hour(s)). No results found.    Assessment and Plan: 71 y.o. female with   1. Skin tear of left lower leg without complication, initial encounter - patient afebrile, no signs of secondary infection or cellulitis on exam. Patient is at increased risk due to diabetes.  Injury is too old for steri-strips.  - apply vaseline dressing. Avoid re-traumatizing the area - educated on wound care and how monitor for signs of infection   Patient education and anticipatory guidance given Patient agrees with treatment plan Follow-up as needed if symptoms worsen or fail to improve  Darlyne Russian PA-C

## 2017-06-05 ENCOUNTER — Ambulatory Visit: Payer: Medicare Other

## 2017-06-14 DIAGNOSIS — I708 Atherosclerosis of other arteries: Secondary | ICD-10-CM | POA: Diagnosis not present

## 2017-06-14 DIAGNOSIS — I728 Aneurysm of other specified arteries: Secondary | ICD-10-CM | POA: Diagnosis not present

## 2017-06-14 DIAGNOSIS — Z48812 Encounter for surgical aftercare following surgery on the circulatory system: Secondary | ICD-10-CM | POA: Diagnosis not present

## 2017-06-21 DIAGNOSIS — Z48812 Encounter for surgical aftercare following surgery on the circulatory system: Secondary | ICD-10-CM | POA: Diagnosis not present

## 2017-06-21 DIAGNOSIS — I728 Aneurysm of other specified arteries: Secondary | ICD-10-CM | POA: Diagnosis not present

## 2017-06-21 DIAGNOSIS — I708 Atherosclerosis of other arteries: Secondary | ICD-10-CM | POA: Diagnosis not present

## 2017-07-04 ENCOUNTER — Ambulatory Visit (INDEPENDENT_AMBULATORY_CARE_PROVIDER_SITE_OTHER): Payer: Medicare Other | Admitting: Physician Assistant

## 2017-07-04 ENCOUNTER — Encounter: Payer: Self-pay | Admitting: Physician Assistant

## 2017-07-04 VITALS — BP 118/64 | HR 76 | Temp 98.3°F | Wt 160.0 lb

## 2017-07-04 DIAGNOSIS — J0140 Acute pansinusitis, unspecified: Secondary | ICD-10-CM | POA: Diagnosis not present

## 2017-07-04 DIAGNOSIS — R490 Dysphonia: Secondary | ICD-10-CM | POA: Diagnosis not present

## 2017-07-04 MED ORDER — PREDNISONE 50 MG PO TABS: ORAL_TABLET | ORAL | 0 refills | Status: DC

## 2017-07-04 MED ORDER — AMOXICILLIN-POT CLAVULANATE 875-125 MG PO TABS: 1.0000 | ORAL_TABLET | Freq: Two times a day (BID) | ORAL | 0 refills | Status: DC

## 2017-07-04 NOTE — Progress Notes (Signed)
   Subjective:    Patient ID: Jasmine Fitzgerald, female    DOB: Mar 23, 1946, 71 y.o.   MRN: [id number]  HPI  Pt is a 71 yo female who presents to the clinic with 9 days of upper respiratory symptoms. She continues to feel worse and worse. She has had hoarsness since surgery in feb. She is having lots of sinus pressure, headache, nasal congestion, ear popping, ST, sinus drainage, nasal congestion. She is taking tylenol cold and sinus, zarbees cough formula, cough drops, benadryl. She is having little relief. Denies any fever, chills, SOB, wheezing.   .. Active Ambulatory Problems    Diagnosis Date Noted  . 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 Anxiety and depression 07/15/2015  . Atrophic vaginitis 02/15/2016  . Reflux 02/15/2016  . Restless leg 02/15/2016  . Renal cyst 02/15/2016  . Lumbar radiculopathy 05/24/2016  . Fibromyalgia 07/18/2016  . Controlled type 2 diabetes mellitus without complication, without long-term current use of insulin ([place]) 08/29/2016  . Essential hypertension, benign 11/02/2016  . 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 Hearing aid worn 03/14/2017  . Hoarseness 07/04/2017   Resolved Ambulatory Problems    Diagnosis Date Noted  . No Resolved Ambulatory Problems   Past Medical History:  Diagnosis Date  . Carpal tunnel syndrome   . Colon polyp   . Kidney stones   . Renal cyst       Review of Systems See HPI.     Objective:   Physical Exam  Constitutional: She is oriented to person, place, and time. She appears well-developed and well-nourished.  HENT:  Head: Normocephalic and atraumatic.  TM's erythematous without blood or pus.  orpharynx erythematous without tonsil swelling or exudate.  Bilateral nasal turbinates red and swollen with rhinorrhea.  Tenderness over maxillary sinuses and nasal bridge.   Eyes: Conjunctivae are normal. Right eye exhibits no discharge.  Neck: Normal range of motion. Neck supple.  Cardiovascular: Normal rate, regular rhythm  and normal heart sounds.   Pulmonary/Chest: Effort normal and breath sounds normal. She has no wheezes.  Lymphadenopathy:    She has cervical adenopathy.  Neurological: She is alert and oriented to person, place, and time.  Psychiatric: She has a normal mood and affect. Her behavior is normal.          Assessment & Plan:  Marland KitchenMarland KitchenDiane was seen today for cough and sore throat.  Diagnoses and all orders for this visit:  Acute non-recurrent pansinusitis -     amoxicillin-clavulanate (AUGMENTIN) 875-125 MG tablet; Take 1 tablet by mouth 2 (two) times daily. For 10 days. -     predniSONE (DELTASONE) 50 MG tablet; Take one tablet for 5 days.  Hoarseness -     predniSONE (DELTASONE) 50 MG tablet; Take one tablet for 5 days.   Follow up if not improving. Consider adding flonase. If continues to be horse may need to consider ENT referral and make sure no damage was done via feb surgery.

## 2017-07-10 ENCOUNTER — Encounter: Payer: Self-pay | Admitting: Family Medicine

## 2017-07-10 ENCOUNTER — Ambulatory Visit (INDEPENDENT_AMBULATORY_CARE_PROVIDER_SITE_OTHER): Payer: Medicare Other | Admitting: Family Medicine

## 2017-07-10 VITALS — BP 125/68 | HR 73 | Wt 160.0 lb

## 2017-07-10 DIAGNOSIS — E119 Type 2 diabetes mellitus without complications: Secondary | ICD-10-CM

## 2017-07-10 DIAGNOSIS — Z23 Encounter for immunization: Secondary | ICD-10-CM | POA: Diagnosis not present

## 2017-07-10 DIAGNOSIS — I10 Essential (primary) hypertension: Secondary | ICD-10-CM

## 2017-07-10 DIAGNOSIS — F5101 Primary insomnia: Secondary | ICD-10-CM | POA: Diagnosis not present

## 2017-07-10 LAB — POCT GLYCOSYLATED HEMOGLOBIN (HGB A1C): HEMOGLOBIN A1C: 6

## 2017-07-10 MED ORDER — ZOLPIDEM TARTRATE 10 MG PO TABS: 5.0000 mg | ORAL_TABLET | Freq: Every evening | ORAL | 1 refills | Status: DC | PRN

## 2017-07-10 NOTE — Progress Notes (Signed)
Subjective:    CC: DM, HTN  HPI:  Diabetes - no hypoglycemic events. No wounds or sores that are not healing well. No increased thirst or urination. Checking glucose at home. Taking medications as prescribed without any side effects.  Hypertension- Pt denies chest pain, SOB, dizziness, or heart palpitations.  Admits she hasn't been taking her meds regularly.  Says she has missed severael days. Brought in home BP logs.  .  Denies medication side effects.    She was just seen and treated for sinusitis on 9/4. She has been on Augmentin and prednisone.  She is feeling some better.  Has had hoarseness on and off since surgery last Feb.    Insomnia - says she is up to 2./5 tabs of the trazodone and still not helping.   Past medical history, Surgical history, Family history not pertinant except as noted below, Social history, Allergies, and medications have been entered into the medical record, reviewed, and corrections made.   Review of Systems: No fevers, chills, night sweats, weight loss, chest pain, or shortness of breath.   Objective:    General: Well Developed, well nourished, and in no acute distress.  Neuro: Alert and oriented x3, extra-ocular muscles intact, sensation grossly intact.  HEENT: Normocephalic, atraumatic  Skin: Warm and dry, no rashes. Cardiac: Regular rate and rhythm, no murmurs rubs or gallops, no lower extremity edema.  Respiratory: Clear to auscultation bilaterally. Not using accessory muscles, speaking in full sentences.   Impression and Recommendations:    DM- Well controlled. Continue current regimen. Follow up in  4 months.    HTN - Well controlled today but BP at home up and down  Encouraged her to be more consistent with medication.  . Continue current regimen. Can incrase the losartan component if needed in future. Follow up in  4 months.    Insomnia - Discussed options. She took [place] years ago. Ok with trying that again.  Warned about dependency issues  and side effects.

## 2017-07-10 NOTE — Patient Instructions (Signed)
Call me next week if you think you need to see Dr. Dimas Millin.

## 2017-07-30 ENCOUNTER — Other Ambulatory Visit: Payer: Self-pay | Admitting: Family Medicine

## 2017-08-28 ENCOUNTER — Other Ambulatory Visit: Payer: Self-pay | Admitting: Family Medicine

## 2017-08-28 DIAGNOSIS — I10 Essential (primary) hypertension: Secondary | ICD-10-CM

## 2017-09-17 ENCOUNTER — Other Ambulatory Visit: Payer: Self-pay | Admitting: Family Medicine

## 2017-09-18 ENCOUNTER — Other Ambulatory Visit: Payer: Self-pay | Admitting: Family Medicine

## 2017-09-18 DIAGNOSIS — M545 Low back pain: Secondary | ICD-10-CM | POA: Diagnosis not present

## 2017-11-02 DIAGNOSIS — M545 Low back pain: Secondary | ICD-10-CM | POA: Diagnosis not present

## 2017-11-03 LAB — BASIC METABOLIC PANEL WITH GFR: GFR CALC NON AF AMER: 92

## 2017-11-03 LAB — BASIC METABOLIC PANEL: CREATININE: 0.6 (ref 0.5–1.1)

## 2017-11-08 DIAGNOSIS — M48061 Spinal stenosis, lumbar region without neurogenic claudication: Secondary | ICD-10-CM | POA: Diagnosis not present

## 2017-11-08 DIAGNOSIS — M5136 Other intervertebral disc degeneration, lumbar region: Secondary | ICD-10-CM | POA: Diagnosis not present

## 2017-11-08 DIAGNOSIS — M4807 Spinal stenosis, lumbosacral region: Secondary | ICD-10-CM | POA: Diagnosis not present

## 2017-11-08 DIAGNOSIS — M438X6 Other specified deforming dorsopathies, lumbar region: Secondary | ICD-10-CM | POA: Diagnosis not present

## 2017-11-08 DIAGNOSIS — M545 Low back pain: Secondary | ICD-10-CM | POA: Diagnosis not present

## 2017-11-09 ENCOUNTER — Encounter: Payer: Self-pay | Admitting: Family Medicine

## 2017-11-09 ENCOUNTER — Ambulatory Visit (INDEPENDENT_AMBULATORY_CARE_PROVIDER_SITE_OTHER): Payer: Medicare Other | Admitting: Family Medicine

## 2017-11-09 VITALS — BP 121/59 | HR 65 | Ht 62.0 in | Wt 162.0 lb

## 2017-11-09 DIAGNOSIS — E119 Type 2 diabetes mellitus without complications: Secondary | ICD-10-CM | POA: Diagnosis not present

## 2017-11-09 DIAGNOSIS — F5101 Primary insomnia: Secondary | ICD-10-CM

## 2017-11-09 DIAGNOSIS — F329 Major depressive disorder, single episode, unspecified: Secondary | ICD-10-CM

## 2017-11-09 DIAGNOSIS — F32A Depression, unspecified: Secondary | ICD-10-CM

## 2017-11-09 DIAGNOSIS — F419 Anxiety disorder, unspecified: Secondary | ICD-10-CM

## 2017-11-09 DIAGNOSIS — I10 Essential (primary) hypertension: Secondary | ICD-10-CM | POA: Diagnosis not present

## 2017-11-09 LAB — POCT GLYCOSYLATED HEMOGLOBIN (HGB A1C): HEMOGLOBIN A1C: 6.2

## 2017-11-09 MED ORDER — AMLODIPINE BESYLATE 5 MG PO TABS: 5.0000 mg | ORAL_TABLET | Freq: Every day | ORAL | 1 refills | Status: DC

## 2017-11-09 MED ORDER — LOSARTAN POTASSIUM-HCTZ 50-12.5 MG PO TABS: 1.0000 | ORAL_TABLET | Freq: Every day | ORAL | 1 refills | Status: DC

## 2017-11-09 MED ORDER — ZOLPIDEM TARTRATE 5 MG PO TABS: 5.0000 mg | ORAL_TABLET | Freq: Every evening | ORAL | 2 refills | Status: DC | PRN

## 2017-11-09 NOTE — Progress Notes (Signed)
Subjective:    CC: DM, BP ck  HPI:  Hypertension- Pt denies chest pain, SOB, dizziness, or heart palpitations.  Taking meds as directed w/o problems.  Denies medication side effects.  Home BP look great. See scanned log.   Diabetes - no hypoglycemic events. No wounds or sores that are not healing well. No increased thirst or urination. Checking glucose at home. Taking medications as prescribed without any side effects. Most CBG under 100. See scanned log.   F/U insomnia -to help her sleep.says the Lorrin Mais is much better than the trazodone.  She took a whole tab she actually counted money and did not realize she did until the next morning.  Her husband remembers her doing it.  So she went down to half a tab and that has actually been working great without any side effects.  Anxiety-she does use her alprazolam sparingly.  Last filled about 7 months ago.  Not sure if she needs a refill at this time.    Past medical history, Surgical history, Family history not pertinant except as noted below, Social history, Allergies, and medications have been entered into the medical record, reviewed, and corrections made.   Review of Systems: No fevers, chills, night sweats, weight loss, chest pain, or shortness of breath.   Objective:    General: Well Developed, well nourished, and in no acute distress.  Neuro: Alert and oriented x3, extra-ocular muscles intact, sensation grossly intact.  HEENT: Normocephalic, atraumatic  Skin: Warm and dry, no rashes. Cardiac: Regular rate and rhythm, no murmurs rubs or gallops, no lower extremity edema.  Respiratory: Clear to auscultation bilaterally. Not using accessory muscles, speaking in full sentences.   Impression and Recommendations:   HTN - Well controlled. Continue current regimen. Follow up in  3- 6months.   DM-A1c up just slightly from previous up to 6.2 but she admits she is been on a little weight over the holidays and been eating a few more sweets.   Overall though her blood glucose log looks fantastic.  And her diabetes is still well controlled.  Just reminded her to schedule her eye exam.  Follow-up in 3 months.  Insomnia-she says she has enough Ambien now for about another month and a half.  We will go ahead and refill but change to 5 mg since she is really taking a half a tab.  Discussed that this is really safer particularly in women  Anxiety-okay to refill alprazolam when she runs out.  She really does a good job overall and using it very sparingly.

## 2017-11-14 ENCOUNTER — Telehealth: Payer: Self-pay | Admitting: Family Medicine

## 2017-11-14 NOTE — Telephone Encounter (Signed)
Please call [facility] for most recent labs done.  Particularly renal function.

## 2017-11-14 NOTE — Telephone Encounter (Signed)
Spoke with [facility], Pt did have a GFR drawn in 2019. They will fax results now to PCP.

## 2017-11-20 ENCOUNTER — Encounter: Payer: Self-pay | Admitting: Family Medicine

## 2017-11-30 DIAGNOSIS — M47817 Spondylosis without myelopathy or radiculopathy, lumbosacral region: Secondary | ICD-10-CM | POA: Diagnosis not present

## 2017-11-30 DIAGNOSIS — M545 Low back pain: Secondary | ICD-10-CM | POA: Diagnosis not present

## 2017-12-14 DIAGNOSIS — M545 Low back pain: Secondary | ICD-10-CM | POA: Diagnosis not present

## 2017-12-15 DIAGNOSIS — Z85828 Personal history of other malignant neoplasm of skin: Secondary | ICD-10-CM | POA: Diagnosis not present

## 2017-12-15 DIAGNOSIS — D229 Melanocytic nevi, unspecified: Secondary | ICD-10-CM | POA: Diagnosis not present

## 2017-12-15 DIAGNOSIS — L299 Pruritus, unspecified: Secondary | ICD-10-CM | POA: Diagnosis not present

## 2017-12-15 DIAGNOSIS — L820 Inflamed seborrheic keratosis: Secondary | ICD-10-CM | POA: Diagnosis not present

## 2017-12-15 DIAGNOSIS — L309 Dermatitis, unspecified: Secondary | ICD-10-CM | POA: Diagnosis not present

## 2017-12-15 DIAGNOSIS — B079 Viral wart, unspecified: Secondary | ICD-10-CM | POA: Diagnosis not present

## 2017-12-20 DIAGNOSIS — M545 Low back pain: Secondary | ICD-10-CM | POA: Diagnosis not present

## 2017-12-20 DIAGNOSIS — R6889 Other general symptoms and signs: Secondary | ICD-10-CM | POA: Diagnosis not present

## 2017-12-22 DIAGNOSIS — I708 Atherosclerosis of other arteries: Secondary | ICD-10-CM | POA: Diagnosis not present

## 2017-12-22 DIAGNOSIS — I728 Aneurysm of other specified arteries: Secondary | ICD-10-CM | POA: Diagnosis not present

## 2017-12-22 DIAGNOSIS — Z48812 Encounter for surgical aftercare following surgery on the circulatory system: Secondary | ICD-10-CM | POA: Diagnosis not present

## 2017-12-27 DIAGNOSIS — I728 Aneurysm of other specified arteries: Secondary | ICD-10-CM | POA: Diagnosis not present

## 2017-12-27 DIAGNOSIS — Z48812 Encounter for surgical aftercare following surgery on the circulatory system: Secondary | ICD-10-CM | POA: Diagnosis not present

## 2017-12-27 DIAGNOSIS — I708 Atherosclerosis of other arteries: Secondary | ICD-10-CM | POA: Diagnosis not present

## 2017-12-28 DIAGNOSIS — H43813 Vitreous degeneration, bilateral: Secondary | ICD-10-CM | POA: Diagnosis not present

## 2017-12-28 DIAGNOSIS — H2513 Age-related nuclear cataract, bilateral: Secondary | ICD-10-CM | POA: Diagnosis not present

## 2017-12-28 DIAGNOSIS — H524 Presbyopia: Secondary | ICD-10-CM | POA: Diagnosis not present

## 2017-12-28 LAB — HM DIABETES EYE EXAM

## 2017-12-29 DIAGNOSIS — M549 Dorsalgia, unspecified: Secondary | ICD-10-CM | POA: Diagnosis not present

## 2018-01-01 DIAGNOSIS — M545 Low back pain: Secondary | ICD-10-CM | POA: Diagnosis not present

## 2018-01-01 DIAGNOSIS — R6889 Other general symptoms and signs: Secondary | ICD-10-CM | POA: Diagnosis not present

## 2018-01-08 DIAGNOSIS — M549 Dorsalgia, unspecified: Secondary | ICD-10-CM | POA: Diagnosis not present

## 2018-01-19 DIAGNOSIS — M549 Dorsalgia, unspecified: Secondary | ICD-10-CM | POA: Diagnosis not present

## 2018-01-19 DIAGNOSIS — R6889 Other general symptoms and signs: Secondary | ICD-10-CM | POA: Diagnosis not present

## 2018-01-22 DIAGNOSIS — M549 Dorsalgia, unspecified: Secondary | ICD-10-CM | POA: Diagnosis not present

## 2018-01-25 DIAGNOSIS — M545 Low back pain: Secondary | ICD-10-CM | POA: Diagnosis not present

## 2018-01-25 DIAGNOSIS — M549 Dorsalgia, unspecified: Secondary | ICD-10-CM | POA: Diagnosis not present

## 2018-01-26 ENCOUNTER — Other Ambulatory Visit: Payer: Self-pay | Admitting: Family Medicine

## 2018-02-08 ENCOUNTER — Ambulatory Visit (INDEPENDENT_AMBULATORY_CARE_PROVIDER_SITE_OTHER): Payer: Medicare Other | Admitting: Family Medicine

## 2018-02-08 ENCOUNTER — Ambulatory Visit (INDEPENDENT_AMBULATORY_CARE_PROVIDER_SITE_OTHER): Payer: Medicare Other

## 2018-02-08 ENCOUNTER — Encounter: Payer: Self-pay | Admitting: Family Medicine

## 2018-02-08 VITALS — BP 134/50 | HR 62 | Ht 62.0 in | Wt 163.0 lb

## 2018-02-08 DIAGNOSIS — E119 Type 2 diabetes mellitus without complications: Secondary | ICD-10-CM

## 2018-02-08 DIAGNOSIS — D1803 Hemangioma of intra-abdominal structures: Secondary | ICD-10-CM | POA: Insufficient documentation

## 2018-02-08 DIAGNOSIS — M8580 Other specified disorders of bone density and structure, unspecified site: Secondary | ICD-10-CM | POA: Insufficient documentation

## 2018-02-08 DIAGNOSIS — I10 Essential (primary) hypertension: Secondary | ICD-10-CM | POA: Diagnosis not present

## 2018-02-08 DIAGNOSIS — Z13220 Encounter for screening for lipoid disorders: Secondary | ICD-10-CM | POA: Diagnosis not present

## 2018-02-08 DIAGNOSIS — M25561 Pain in right knee: Secondary | ICD-10-CM | POA: Diagnosis not present

## 2018-02-08 DIAGNOSIS — E559 Vitamin D deficiency, unspecified: Secondary | ICD-10-CM | POA: Diagnosis not present

## 2018-02-08 DIAGNOSIS — G8929 Other chronic pain: Secondary | ICD-10-CM | POA: Diagnosis not present

## 2018-02-08 DIAGNOSIS — L639 Alopecia areata, unspecified: Secondary | ICD-10-CM | POA: Insufficient documentation

## 2018-02-08 DIAGNOSIS — E785 Hyperlipidemia, unspecified: Secondary | ICD-10-CM | POA: Insufficient documentation

## 2018-02-08 DIAGNOSIS — M7061 Trochanteric bursitis, right hip: Secondary | ICD-10-CM | POA: Insufficient documentation

## 2018-02-08 LAB — POCT GLYCOSYLATED HEMOGLOBIN (HGB A1C): HEMOGLOBIN A1C: 6.2

## 2018-02-08 MED ORDER — ZOLPIDEM TARTRATE 5 MG PO TABS: 5.0000 mg | ORAL_TABLET | Freq: Every evening | ORAL | 2 refills | Status: DC | PRN

## 2018-02-08 NOTE — Progress Notes (Signed)
Subjective:    CC: BP, DM  HPI:  Hypertension- Pt denies chest pain, SOB, dizziness, or heart palpitations.  Taking meds as directed w/o problems.  Denies medication side effects.    Diabetes - no hypoglycemic events. No wounds or sores that are not healing well. No increased thirst or urination. Checking glucose at home. Taking medications as prescribed without any side effects.  He also complains of chronic right knee pain.  She says it bothers her to the point that sometimes it will actually wake her up at night.  For her back she is now taking Celebrex and doing physical therapy.  She is actually started working with a Physiological scientist and that has been helping.  But her knee has been keeping her awake at night.  She said she had a shot in that knee years ago and it actually really helped.  She denies any significant swelling of the joint.   Past medical history, Surgical history, Family history not pertinant except as noted below, Social history, Allergies, and medications have been entered into the medical record, reviewed, and corrections made.   Review of Systems: No fevers, chills, night sweats, weight loss, chest pain, or shortness of breath.   Objective:    General: Well Developed, well nourished, and in no acute distress.  Neuro: Alert and oriented x3, extra-ocular muscles intact, sensation grossly intact.  HEENT: Normocephalic, atraumatic  Skin: Warm and dry, no rashes. Cardiac: Regular rate and rhythm, no murmurs rubs or gallops, no lower extremity edema.  Respiratory: Clear to auscultation bilaterally. Not using accessory muscles, speaking in full sentences. MSK: Tender along the medial lateral joint lines.  No significant crepitus.  Strength is 5 out of 5.   Impression and Recommendations:    HTN - Well controlled. Continue current regimen. Follow up in  74months.    DM -controlled.  Hemoglobin A1c is 6.2 today.  Continue current regimen.  Follow-up in 4  months.  Chronic right knee pain-tender over the bilateral joint lines.  Will get x-ray today and will get him in with 1 of our sports med docs to see if she might benefit from injection or indoor therapy.  Back pain-using Celebrex instead of Aleve now.  Vitamin D deficiency-due to recheck vitamin D level.  Due to check screening lipid levels.

## 2018-03-02 DIAGNOSIS — R1314 Dysphagia, pharyngoesophageal phase: Secondary | ICD-10-CM | POA: Diagnosis not present

## 2018-03-07 DIAGNOSIS — R1314 Dysphagia, pharyngoesophageal phase: Secondary | ICD-10-CM | POA: Diagnosis not present

## 2018-03-07 DIAGNOSIS — K219 Gastro-esophageal reflux disease without esophagitis: Secondary | ICD-10-CM | POA: Diagnosis not present

## 2018-03-12 ENCOUNTER — Telehealth: Payer: Self-pay | Admitting: Family Medicine

## 2018-03-12 ENCOUNTER — Ambulatory Visit (INDEPENDENT_AMBULATORY_CARE_PROVIDER_SITE_OTHER): Payer: Medicare Other | Admitting: Sports Medicine

## 2018-03-12 ENCOUNTER — Encounter: Payer: Self-pay | Admitting: Sports Medicine

## 2018-03-12 DIAGNOSIS — M1711 Unilateral primary osteoarthritis, right knee: Secondary | ICD-10-CM | POA: Diagnosis not present

## 2018-03-12 DIAGNOSIS — M7061 Trochanteric bursitis, right hip: Secondary | ICD-10-CM

## 2018-03-12 NOTE — Telephone Encounter (Signed)
lvm informing pt that her labs have been ordered and faxed to [place], [facility], [place]

## 2018-03-12 NOTE — Assessment & Plan Note (Signed)
Exercises given, return in 1 month, injection if no better. Some of this pain may be coming from her back.

## 2018-03-12 NOTE — Progress Notes (Signed)
Subjective:    I'm seeing this patient as a consultation for: Dr. Beatrice Lecher  CC: Right knee pain  HPI: This is a pleasant 72 year old female, right knee pain at the medial joint line with gelling for years now.  X-rays were for the most part negative, pain is moderate, persistent, localized without radiation, no mechanical symptoms, no trauma.  Oral Celebrex is only been minimally efficacious.  In addition she has pain that she localizes over the lateral hip, moderate, persistent without radiation, worse when lying on the ipsilateral side.  I reviewed the past medical history, family history, social history, surgical history, and allergies today and no changes were needed.  Please see the problem list section below in epic for further details.  Past Medical History: Past Medical History:  Diagnosis Date  . Carpal tunnel syndrome   . Colon polyp   . Kidney stones   . Renal cyst    benign   Past Surgical History: Past Surgical History:  Procedure Laterality Date  . ABDOMINAL SURGERY  12/16/2016   Pancreaticoduodenal aortic aneurysm repair  . BREAST REDUCTION SURGERY  2013  . HERNIA REPAIR  1996  . KIDNEY STONE SURGERY  2015  . KNEE ARTHROSCOPY    . TONSILLECTOMY  1957  . TRIGGER FINGER RELEASE  07/2015  . UPPER GASTROINTESTINAL ENDOSCOPY     Social History: Social History   Socioeconomic History  . Marital status: Married    Spouse name: Patrick Jupiter  . Number of children: 3  . Years of education: 66  . Highest education level: Not on file  Occupational History  . Occupation: Retired  Scientific laboratory technician  . Financial resource strain: Not on file  . Food insecurity:    Worry: Not on file    Inability: Not on file  . Transportation needs:    Medical: Not on file    Non-medical: Not on file  Tobacco Use  . Smoking status: Never Smoker  . Smokeless tobacco: Never Used  Substance and Sexual Activity  . Alcohol use: No  . Drug use: No  . Sexual activity: Not Currently    Lifestyle  . Physical activity:    Days per week: Not on file    Minutes per session: Not on file  . Stress: Not on file  Relationships  . Social connections:    Talks on phone: Not on file    Gets together: Not on file    Attends religious service: Not on file    Active member of club or organization: Not on file    Attends meetings of clubs or organizations: Not on file    Relationship status: Not on file  Other Topics Concern  . Not on file  Social History Narrative   Retired. High school degree. Married to Hernandez. 3 adult children. Currently lives with her spouse.   Family History: Family History  Problem Relation Age of Onset  . Heart failure Mother   . Heart failure Father   . Heart attack Father   . Diabetes Brother   . Cancer Maternal Aunt   . Cancer Maternal Uncle   . Ovarian cancer Unknown   . Lung cancer Unknown   . Throat cancer Unknown   . Stroke Maternal Grandmother    Allergies: Allergies  Allergen Reactions  . Other Itching    Steri strips at time of 12/2016 surg  . Tape Itching    Steri strips at time of 12/2016 surg  . Codeine Nausea And Vomiting  and Other (See Comments)    GI Upset  . Hydrocodone Nausea And Vomiting   Medications: See med rec.  Review of Systems: No headache, visual changes, nausea, vomiting, diarrhea, constipation, dizziness, abdominal pain, skin rash, fevers, chills, night sweats, weight loss, swollen lymph nodes, body aches, joint swelling, muscle aches, chest pain, shortness of breath, mood changes, visual or auditory hallucinations.   Objective:   General: Well Developed, well nourished, and in no acute distress.  Neuro:  Extra-ocular muscles intact, able to move all 4 extremities, sensation grossly intact.  Deep tendon reflexes tested were normal. Psych: Alert and oriented, mood congruent with affect. ENT:  Ears and nose appear unremarkable.  Hearing grossly normal. Neck: Unremarkable overall appearance, trachea midline.  No  visible thyroid enlargement. Eyes: Conjunctivae and lids appear unremarkable.  Pupils equal and round. Skin: Warm and dry, no rashes noted.  Cardiovascular: Pulses palpable, no extremity edema. Right knee: Normal to inspection with no erythema or effusion or obvious bony abnormalities. Tender to palpation of the medial joint line ROM normal in flexion and extension and lower leg rotation. Ligaments with solid consistent endpoints including ACL, PCL, LCL, MCL. Negative Mcmurray's and provocative meniscal tests. Non painful patellar compression. Patellar and quadriceps tendons unremarkable. Hamstring and quadriceps strength is normal. Right hip: ROM IR: 60 Deg, ER: 60 Deg, Flexion: 120 Deg, Extension: 100 Deg, Abduction: 45 Deg, Adduction: 45 Deg Strength IR: 5/5, ER: 5/5, Flexion: 5/5, Extension: 5/5, Abduction: 5/5, Adduction: 5/5 Pelvic alignment unremarkable to inspection and palpation. Standing hip rotation and gait without trendelenburg / unsteadiness. Greater trochanter with tenderness to palpation. No tenderness over piriformis. No SI joint tenderness and normal minimal SI movement.  Procedure: Real-time Ultrasound Guided Injection of right knee Device: GE Logiq E  Verbal informed consent obtained.  Time-out conducted.  Noted no overlying erythema, induration, or other signs of local infection.  Skin prepped in a sterile fashion.  Local anesthesia: Topical Ethyl chloride.  With sterile technique and under real time ultrasound guidance: 1 cc Kenalog 40, 2 cc lidocaine, 2 cc bupivacaine injected easily. Completed without difficulty  Pain immediately resolved suggesting accurate placement of the medication.  Advised to call if fevers/chills, erythema, induration, drainage, or persistent bleeding.  Images permanently stored and available for review in the ultrasound unit.  Impression: Technically successful ultrasound guided injection.  Impression and Recommendations:   This  case required medical decision making of moderate complexity.  Primary osteoarthritis of right knee Minimal spurring of the tibial spine as well as osteophytes on the lateral joint line on x-ray. Reviewed myself. Injection, restart Celebrex. Rehab exercises given. Return in a month.  Greater trochanteric bursitis of right hip Exercises given, return in 1 month, injection if no better. Some of this pain may be coming from her back. ___________________________________________ Gwen Her. Dianah Field, M.D., ABFM., CAQSM. Primary Care and [place] [employer] Instructor of [employer] [facility]

## 2018-03-12 NOTE — Assessment & Plan Note (Signed)
Minimal spurring of the tibial spine as well as osteophytes on the lateral joint line on x-ray. Reviewed myself. Injection, restart Celebrex. Rehab exercises given. Return in a month.

## 2018-03-14 DIAGNOSIS — Z13220 Encounter for screening for lipoid disorders: Secondary | ICD-10-CM | POA: Diagnosis not present

## 2018-03-14 DIAGNOSIS — E559 Vitamin D deficiency, unspecified: Secondary | ICD-10-CM | POA: Diagnosis not present

## 2018-03-14 DIAGNOSIS — E119 Type 2 diabetes mellitus without complications: Secondary | ICD-10-CM | POA: Diagnosis not present

## 2018-03-14 DIAGNOSIS — I10 Essential (primary) hypertension: Secondary | ICD-10-CM | POA: Diagnosis not present

## 2018-03-15 LAB — LIPID PANEL
Chol/HDL Ratio: 2.3 ratio (ref 0.0–4.4)
Cholesterol, Total: 200 mg/dL — ABNORMAL HIGH (ref 100–199)
HDL: 87 mg/dL (ref >39)
LDL Calculated: 101 mg/dL — ABNORMAL HIGH (ref 0–99)
Triglycerides: 62 mg/dL (ref 0–149)
VLDL Cholesterol Cal: 12 mg/dL (ref 5–40)

## 2018-03-15 LAB — VITAMIN D 25 HYDROXY (VIT D DEFICIENCY, FRACTURES): VIT D 25 HYDROXY: 29.1 ng/mL — AB (ref 30.0–100.0)

## 2018-03-27 ENCOUNTER — Encounter: Payer: Self-pay | Admitting: Family Medicine

## 2018-03-27 ENCOUNTER — Ambulatory Visit (INDEPENDENT_AMBULATORY_CARE_PROVIDER_SITE_OTHER): Payer: Medicare Other | Admitting: Family Medicine

## 2018-03-27 VITALS — BP 123/58 | HR 64 | Ht 62.0 in | Wt 163.0 lb

## 2018-03-27 DIAGNOSIS — Z0000 Encounter for general adult medical examination without abnormal findings: Secondary | ICD-10-CM

## 2018-03-27 DIAGNOSIS — Z1231 Encounter for screening mammogram for malignant neoplasm of breast: Secondary | ICD-10-CM | POA: Diagnosis not present

## 2018-03-27 LAB — HM MAMMOGRAPHY

## 2018-03-27 MED ORDER — CELECOXIB 200 MG PO CAPS: 200.0000 mg | ORAL_CAPSULE | Freq: Two times a day (BID) | ORAL | 6 refills | Status: DC

## 2018-03-27 MED ORDER — ALPRAZOLAM 0.25 MG PO TABS: 0.2500 mg | ORAL_TABLET | Freq: Every evening | ORAL | 1 refills | Status: DC | PRN

## 2018-03-27 MED ORDER — ZOLPIDEM TARTRATE 5 MG PO TABS: 5.0000 mg | ORAL_TABLET | Freq: Every evening | ORAL | 2 refills | Status: DC | PRN

## 2018-03-27 NOTE — Patient Instructions (Signed)

## 2018-03-27 NOTE — Progress Notes (Signed)
Subjective:   Jasmine Fitzgerald is a 72 y.o. female who presents for Medicare Annual (Subsequent) preventive examination.  She has been under a lot of stress recently and is requesting a refill on her alprazolam.  She has not filled it in almost a year and tries to take it very sparingly.  Infectious puts the tabs in half.  She also wanted to know which NSAID would be best to take either the Aleve or the Celebrex.  She was given Celebrex by her orthopedist but then it was not working well so they recommended Aleve.  Then when she saw Dr. Dianah Field, sports medicine he recommended going back on the Celebrex.  She really just uses it as needed.  She also now has bilateral hearing aids and sees Dr. Sharyn Lull at [facility]iology.  Review of Systems:  Comprehensive ROS is negative.         Objective:     Vitals: BP (!) 123/58   Pulse 64   Ht '5\' 2"'  (1.575 m)   Wt 163 lb (73.9 kg)   SpO2 97%   BMI 29.81 kg/m   Body mass index is 29.81 kg/m.  Advanced Directives 03/27/2018 05/31/2016 02/15/2016  Does Patient Have a Medical Advance Directive? Yes Yes Yes  Type of Paramedic of [place];Living will [place];Living will [place];Living will;Out of facility DNR (pink MOST or yellow form)  Does patient want to make changes to medical advance directive? - - No - Patient declined  Copy of [place] in Chart? No - copy requested No - copy requested No - copy requested    Tobacco Social History   Tobacco Use  Smoking Status Never Smoker  Smokeless Tobacco Never Used     Counseling given: Not Answered   Clinical Intake:       Physical Exam  Constitutional: She is oriented to person, place, and time. She appears well-developed and well-nourished.  HENT:  Head: Normocephalic and atraumatic.  Right Ear: External ear normal.  Left Ear: External ear normal.  Nose: Nose normal.  Mouth/Throat: Oropharynx  is clear and moist.  TMs and canals are clear.   Eyes: Pupils are equal, round, and reactive to light. Conjunctivae and EOM are normal.  Neck: Neck supple. No thyromegaly present.  Cardiovascular: Normal rate, regular rhythm and normal heart sounds.  Pulmonary/Chest: Effort normal and breath sounds normal. She has no wheezes.  Lymphadenopathy:    She has no cervical adenopathy.  Neurological: She is alert and oriented to person, place, and time.  Skin: Skin is warm and dry.  Psychiatric: She has a normal mood and affect.                   Past Medical History:  Diagnosis Date  . Carpal tunnel syndrome   . Colon polyp   . Kidney stones   . Renal cyst    benign   Past Surgical History:  Procedure Laterality Date  . ABDOMINAL SURGERY  12/16/2016   Pancreaticoduodenal aortic aneurysm repair  . BREAST REDUCTION SURGERY  2013  . HERNIA REPAIR  1996  . KIDNEY STONE SURGERY  2015  . KNEE ARTHROSCOPY    . TONSILLECTOMY  1957  . TRIGGER FINGER RELEASE  07/2015  . UPPER GASTROINTESTINAL ENDOSCOPY     Family History  Problem Relation Age of Onset  . Heart failure Mother   . Heart failure Father   . Heart attack Father   .  Diabetes Brother   . Cancer Maternal Aunt   . Cancer Maternal Uncle   . Ovarian cancer Unknown   . Lung cancer Unknown   . Throat cancer Unknown   . Stroke Maternal Grandmother    Social History   Socioeconomic History  . Marital status: Married    Spouse name: Patrick Jupiter  . Number of children: 3  . Years of education: 49  . Highest education level: Not on file  Occupational History  . Occupation: Retired  Scientific laboratory technician  . Financial resource strain: Not on file  . Food insecurity:    Worry: Not on file    Inability: Not on file  . Transportation needs:    Medical: Not on file    Non-medical: Not on file  Tobacco Use  . Smoking status: Never Smoker  . Smokeless tobacco: Never Used  Substance and Sexual Activity  . Alcohol use: No  . Drug  use: No  . Sexual activity: Not Currently  Lifestyle  . Physical activity:    Days per week: Not on file    Minutes per session: Not on file  . Stress: Not on file  Relationships  . Social connections:    Talks on phone: Not on file    Gets together: Not on file    Attends religious service: Not on file    Active member of club or organization: Not on file    Attends meetings of clubs or organizations: Not on file    Relationship status: Not on file  Other Topics Concern  . Not on file  Social History Narrative   Retired. High school degree. Married to [place]. 3 adult children. Currently lives with her spouse.    Outpatient Encounter Medications as of 03/27/2018  Medication Sig  . ALPRAZolam (XANAX) 0.25 MG tablet Take 1 tablet (0.25 mg total) by mouth at bedtime as needed for sleep.  Marland Kitchen amLODipine (NORVASC) 5 MG tablet Take 1 tablet (5 mg total) by mouth daily.  Marland Kitchen aspirin 81 MG tablet Take by mouth.  . Biotin 5000 MCG CAPS Take 1 capsule by mouth daily.  . blood glucose meter kit and supplies KIT Dispense based on patient and insurance preference. Use up to once daily as directed. (FOR ICD-9 250.00, 250.01).  . celecoxib (CELEBREX) 200 MG capsule Take 1 capsule (200 mg total) by mouth 2 (two) times daily.  . clobetasol (TEMOVATE) 0.05 % external solution Apply to scalp once or twice daily when needed for itching.  . cyclobenzaprine (FLEXERIL) 10 MG tablet Take 10 mg by mouth 3 (three) times daily as needed for muscle spasms.  Marland Kitchen losartan-hydrochlorothiazide (HYZAAR) 50-12.5 MG tablet Take 1 tablet by mouth daily.  Marland Kitchen omeprazole (PRILOSEC) 20 MG capsule   . triamcinolone cream (KENALOG) 0.1 % Apply to irritated & itchy areas of the body once or twice daily when needed. May mix in equal parts with moisturizer. Not to face.  . TRUE METRIX BLOOD GLUCOSE TEST test strip USE UP TO ONCE DAILY AS DIRECTED  . zolpidem (AMBIEN) 5 MG tablet Take 1 tablet (5 mg total) by mouth at bedtime as needed  for sleep.  . [DISCONTINUED] ALPRAZolam (XANAX) 0.25 MG tablet Take 1 tablet (0.25 mg total) by mouth at bedtime as needed for sleep.  . [DISCONTINUED] celecoxib (CELEBREX) 200 MG capsule Take 200 mg by mouth 2 (two) times daily.  . [DISCONTINUED] zolpidem (AMBIEN) 5 MG tablet Take 1 tablet (5 mg total) by mouth at bedtime as needed for  sleep.  . [DISCONTINUED] Naproxen Sodium 220 MG CAPS Take by mouth.   No facility-administered encounter medications on file as of 03/27/2018.     Activities of Daily Living In your present state of health, do you have any difficulty performing the following activities: 03/27/2018  Hearing? Y  Vision? N  Difficulty concentrating or making decisions? Y  Walking or climbing stairs? N  Dressing or bathing? N  Doing errands, shopping? N  Some recent data might be hidden    Patient Care Team: Hali Marry, MD as PCP - General (Family Medicine) Dinah Beers, MD as Referring Physician (Vascular Surgery) Darlis Loan, MD as Referring Physician (Gastroenterology) Daubert, Marlise Eves, MD (Orthopedic Surgery)    Assessment:   This is a routine wellness examination for Marthella.  Exercise Activities and Dietary recommendations Current Exercise Habits: Structured exercise class(boomer and beyond and personal training), Frequency (Times/Week): 3, Intensity: Moderate  Goals    None      Fall Risk Fall Risk  03/27/2018 03/06/2017 02/15/2016  Falls in the past year? No No No   Depression Screen PHQ 2/9 Scores 03/27/2018 07/10/2017 07/04/2017 05/26/2017  PHQ - 2 Score 1 0 0 0  PHQ- 9 Score - - - -     Cognitive Function     6CIT Screen 03/27/2018 03/14/2017  What Year? 0 points 0 points  What month? 0 points 0 points  What time? 0 points 0 points  Count back from 20 0 points 0 points  Months in reverse 0 points 0 points  Repeat phrase 0 points 0 points  Total Score 0 0    Immunization History  Administered Date(s) Administered  . Influenza  Split 07/02/2015  . Influenza, High Dose Seasonal PF 07/10/2017  . Influenza, Seasonal, Injecte, Preservative Fre 08/08/2013, 07/28/2014  . Influenza,inj,Quad PF,6+ Mos 08/08/2013, 07/02/2015, 07/18/2016  . Influenza,inj,quad, With Preservative 07/28/2014  . Pneumococcal Conjugate-13 02/02/2015  . Pneumococcal Polysaccharide-23 10/31/2012  . Tdap 09/11/2008, 10/31/2008  . Zoster 05/06/2009, 10/31/2012  . Zoster Recombinat (Shingrix) 03/14/2017, 01/19/2018    Qualifies for Shingles Vaccine?Yes  Screening Tests Health Maintenance  Topic Date Due  . Hepatitis C Screening  Apr 19, 1946  . OPHTHALMOLOGY EXAM  04/06/2017  . INFLUENZA VACCINE  05/31/2018  . FOOT EXAM  07/10/2018  . HEMOGLOBIN A1C  08/10/2018  . TETANUS/TDAP  10/31/2018  . MAMMOGRAM  03/17/2019  . COLONOSCOPY  11/26/2024  . DEXA SCAN  Completed  . PNA vac Low Risk Adult  Completed    Cancer Screenings: Lung: Low Dose CT Chest recommended if Age 80-80 years, 30 pack-year currently smoking OR have quit w/in 15years. Patient does not qualify. Breast:  Up to date on Mammogram? Yes   Up to date of Bone Density/Dexa? Yes Colorectal: Up to date.   Additional Screenings:  Hepatitis C Screening:      Plan:   Medicare Wellness Exam.   I have personally reviewed and noted the following in the patient's chart:   . Medical and social history . Use of alcohol, tobacco or illicit drugs  . Current medications and supplements . Functional ability and status . Nutritional status . Physical activity . Advanced directives . List of other physicians . Hospitalizations, surgeries, and ER visits in previous 12 months . Vitals . Screenings to include cognitive, depression, and falls . Referrals and appointments . Chronic muscular skeletal pain-recommend that she take the Celebrex daily for the next 3 weeks and see if this helps.  We could also consider a  trial of Cymbalta.   In addition, I have reviewed and discussed with  patient certain preventive protocols, quality metrics, and best practice recommendations. A written personalized care plan for preventive services as well as general preventive health recommendations were provided to patient.     Beatrice Lecher, MD  03/27/2018

## 2018-03-28 DIAGNOSIS — R1310 Dysphagia, unspecified: Secondary | ICD-10-CM | POA: Diagnosis not present

## 2018-04-09 ENCOUNTER — Ambulatory Visit: Payer: Medicare Other | Admitting: Sports Medicine

## 2018-04-11 ENCOUNTER — Encounter: Payer: Self-pay | Admitting: Family Medicine

## 2018-04-12 ENCOUNTER — Encounter: Payer: Self-pay | Admitting: Sports Medicine

## 2018-04-12 ENCOUNTER — Ambulatory Visit (INDEPENDENT_AMBULATORY_CARE_PROVIDER_SITE_OTHER): Payer: Medicare Other | Admitting: Sports Medicine

## 2018-04-12 DIAGNOSIS — M7061 Trochanteric bursitis, right hip: Secondary | ICD-10-CM | POA: Diagnosis not present

## 2018-04-12 DIAGNOSIS — M1711 Unilateral primary osteoarthritis, right knee: Secondary | ICD-10-CM

## 2018-04-12 NOTE — Assessment & Plan Note (Signed)
Improved considerably with injection and Celebrex. She is going to look into Visco supplementation and let me know if she like me to get her approved.Only minimal pain now

## 2018-04-12 NOTE — Assessment & Plan Note (Signed)
Persistence of pain. Trochanteric bursa injection as above. Continue rehab exercises, return to see me in a month.

## 2018-04-12 NOTE — Progress Notes (Signed)
Subjective:    CC: Right hip pain  HPI: This is a pleasant 72 year old female, we treated her at the last visit for knee arthritis and trochanteric bursitis, her knee has gotten significantly better, she only has a touch of pain occasionally.  Doing Celebrex twice a day.  Unfortunately her hip is continued to hurt, right-sided, localized over the greater trochanter, severe, persistent, worse with taking steps up.  I reviewed the past medical history, family history, social history, surgical history, and allergies today and no changes were needed.  Please see the problem list section below in epic for further details.  Past Medical History: Past Medical History:  Diagnosis Date  . Carpal tunnel syndrome   . Colon polyp   . Kidney stones   . Renal cyst    benign   Past Surgical History: Past Surgical History:  Procedure Laterality Date  . ABDOMINAL SURGERY  12/16/2016   Pancreaticoduodenal aortic aneurysm repair  . BREAST REDUCTION SURGERY  2013  . HERNIA REPAIR  1996  . KIDNEY STONE SURGERY  2015  . KNEE ARTHROSCOPY    . TONSILLECTOMY  1957  . TRIGGER FINGER RELEASE  07/2015  . UPPER GASTROINTESTINAL ENDOSCOPY     Social History: Social History   Socioeconomic History  . Marital status: Married    Spouse name: Patrick Jupiter  . Number of children: 3  . Years of education: 35  . Highest education level: Not on file  Occupational History  . Occupation: Retired  Scientific laboratory technician  . Financial resource strain: Not on file  . Food insecurity:    Worry: Not on file    Inability: Not on file  . Transportation needs:    Medical: Not on file    Non-medical: Not on file  Tobacco Use  . Smoking status: Never Smoker  . Smokeless tobacco: Never Used  Substance and Sexual Activity  . Alcohol use: No  . Drug use: No  . Sexual activity: Not Currently  Lifestyle  . Physical activity:    Days per week: Not on file    Minutes per session: Not on file  . Stress: Not on file  Relationships   . Social connections:    Talks on phone: Not on file    Gets together: Not on file    Attends religious service: Not on file    Active member of club or organization: Not on file    Attends meetings of clubs or organizations: Not on file    Relationship status: Not on file  Other Topics Concern  . Not on file  Social History Narrative   Retired. High school degree. Married to [place]. 3 adult children. Currently lives with her spouse.   Family History: Family History  Problem Relation Age of Onset  . Heart failure Mother   . Heart failure Father   . Heart attack Father   . Diabetes Brother   . Cancer Maternal Aunt   . Cancer Maternal Uncle   . Ovarian cancer Unknown   . Lung cancer Unknown   . Throat cancer Unknown   . Stroke Maternal Grandmother    Allergies: Allergies  Allergen Reactions  . Other Itching    Steri strips at time of 12/2016 surg  . Tape Itching    Steri strips at time of 12/2016 surg  . Codeine Nausea And Vomiting and Other (See Comments)    GI Upset  . Hydrocodone Nausea And Vomiting   Medications: See med rec.  Review of Systems:  No fevers, chills, night sweats, weight loss, chest pain, or shortness of breath.   Objective:    General: Well Developed, well nourished, and in no acute distress.  Neuro: Alert and oriented x3, extra-ocular muscles intact, sensation grossly intact.  HEENT: Normocephalic, atraumatic, pupils equal round reactive to light, neck supple, no masses, no lymphadenopathy, thyroid nonpalpable.  Skin: Warm and dry, no rashes. Cardiac: Regular rate and rhythm, no murmurs rubs or gallops, no lower extremity edema.  Respiratory: Clear to auscultation bilaterally. Not using accessory muscles, speaking in full sentences. Right hip: ROM IR: 60 Deg, ER: 60 Deg, Flexion: 120 Deg, Extension: 100 Deg, Abduction: 45 Deg, Adduction: 45 Deg Strength IR: 5/5, ER: 5/5, Flexion: 5/5, Extension: 5/5, Abduction: 5/5, Adduction: 5/5 Pelvic  alignment unremarkable to inspection and palpation. Standing hip rotation and gait without trendelenburg / unsteadiness. Greater trochanter with severe tenderness to palpation. No tenderness over piriformis. No SI joint tenderness and normal minimal SI movement.  Procedure: Real-time Ultrasound Guided Injection of right greater trochanteric bursa Device: GE Logiq E  Verbal informed consent obtained.  Time-out conducted.  Noted no overlying erythema, induration, or other signs of local infection.  Skin prepped in a sterile fashion.  Local anesthesia: Topical Ethyl chloride.  With sterile technique and under real time ultrasound guidance: 22-gauge spinal needle advanced through the hip abductor's to the greater trochanter, injected 1 cc Kenalog 40, 2 cc lidocaine, 2 cc bupivacaine. Completed without difficulty  Pain immediately resolved suggesting accurate placement of the medication.  Advised to call if fevers/chills, erythema, induration, drainage, or persistent bleeding.  Images permanently stored and available for review in the ultrasound unit.  Impression: Technically successful ultrasound guided injection.  Impression and Recommendations:    Greater trochanteric bursitis of right hip Persistence of pain. Trochanteric bursa injection as above. Continue rehab exercises, return to see me in a month.  Primary osteoarthritis of right knee Improved considerably with injection and Celebrex. She is going to look into Visco supplementation and let me know if she like me to get her approved.Only minimal pain now ___________________________________________ Gwen Her. Dianah Field, M.D., ABFM., CAQSM. Primary Care and [place] Instructor of [place] of [facility] of Medicine

## 2018-04-25 ENCOUNTER — Ambulatory Visit (INDEPENDENT_AMBULATORY_CARE_PROVIDER_SITE_OTHER): Payer: Medicare Other | Admitting: Sports Medicine

## 2018-04-25 ENCOUNTER — Encounter: Payer: Self-pay | Admitting: Sports Medicine

## 2018-04-25 DIAGNOSIS — M7041 Prepatellar bursitis, right knee: Secondary | ICD-10-CM

## 2018-04-25 DIAGNOSIS — M1120 Other chondrocalcinosis, unspecified site: Secondary | ICD-10-CM | POA: Diagnosis not present

## 2018-04-25 NOTE — Progress Notes (Addendum)
Subjective:    CC: Right knee swelling  HPI: [place] this [place] this pleasant 72 year old female noted increasing swelling over the anterior aspect of her right knee, no trauma, no change in activity level or diet.  The swelling was hot, reddish.  It has started to decrease over time.  Moderate, improving.  I reviewed the past medical history, family history, social history, surgical history, and allergies today and no changes were needed.  Please see the problem list section below in epic for further details.  Past Medical History: Past Medical History:  Diagnosis Date  . Carpal tunnel syndrome   . Colon polyp   . Kidney stones   . Renal cyst    benign   Past Surgical History: Past Surgical History:  Procedure Laterality Date  . ABDOMINAL SURGERY  12/16/2016   Pancreaticoduodenal aortic aneurysm repair  . BREAST REDUCTION SURGERY  2013  . HERNIA REPAIR  1996  . KIDNEY STONE SURGERY  2015  . KNEE ARTHROSCOPY    . TONSILLECTOMY  1957  . TRIGGER FINGER RELEASE  07/2015  . UPPER GASTROINTESTINAL ENDOSCOPY     Social History: Social History   Socioeconomic History  . Marital status: Married    Spouse name: Patrick Jupiter  . Number of children: 3  . Years of education: 19  . Highest education level: Not on file  Occupational History  . Occupation: Retired  Scientific laboratory technician  . Financial resource strain: Not on file  . Food insecurity:    Worry: Not on file    Inability: Not on file  . Transportation needs:    Medical: Not on file    Non-medical: Not on file  Tobacco Use  . Smoking status: Never Smoker  . Smokeless tobacco: Never Used  Substance and Sexual Activity  . Alcohol use: No  . Drug use: No  . Sexual activity: Not Currently  Lifestyle  . Physical activity:    Days per week: Not on file    Minutes per session: Not on file  . Stress: Not on file  Relationships  . Social connections:    Talks on phone: Not on file    Gets together: Not on file    Attends  religious service: Not on file    Active member of club or organization: Not on file    Attends meetings of clubs or organizations: Not on file    Relationship status: Not on file  Other Topics Concern  . Not on file  Social History Narrative   Retired. High school degree. Married to [place]. 3 adult children. Currently lives with her spouse.   Family History: Family History  Problem Relation Age of Onset  . Heart failure Mother   . Heart failure Father   . Heart attack Father   . Diabetes Brother   . Cancer Maternal Aunt   . Cancer Maternal Uncle   . Ovarian cancer Unknown   . Lung cancer Unknown   . Throat cancer Unknown   . Stroke Maternal Grandmother    Allergies: Allergies  Allergen Reactions  . Other Itching    Steri strips at time of 12/2016 surg  . Tape Itching    Steri strips at time of 12/2016 surg  . Codeine Nausea And Vomiting and Other (See Comments)    GI Upset  . Hydrocodone Nausea And Vomiting   Medications: See med rec.  Review of Systems: No fevers, chills, night sweats, weight loss, chest pain, or shortness of breath.  Objective:    General: Well Developed, well nourished, and in no acute distress.  Neuro: Alert and oriented x3, extra-ocular muscles intact, sensation grossly intact.  HEENT: Normocephalic, atraumatic, pupils equal round reactive to light, neck supple, no masses, no lymphadenopathy, thyroid nonpalpable.  Skin: Warm and dry, no rashes. Cardiac: Regular rate and rhythm, no murmurs rubs or gallops, no lower extremity edema.  Respiratory: Clear to auscultation bilaterally. Not using accessory muscles, speaking in full sentences. Right knee: Minimally swollen prepatellar bursa with slight warmth ROM normal in flexion and extension and lower leg rotation. Ligaments with solid consistent endpoints including ACL, PCL, LCL, MCL. Negative Mcmurray's and provocative meniscal tests. Non painful patellar compression. Patellar and quadriceps  tendons unremarkable. Hamstring and quadriceps strength is normal.  Procedure: Real-time Ultrasound Guided aspiration/injection of right prepatellar bursa Device: GE Logiq E  Verbal informed consent obtained.  Time-out conducted.  Noted no overlying erythema, induration, or other signs of local infection.  Skin prepped in a sterile fashion.  Local anesthesia: Topical Ethyl chloride.  With sterile technique and under real time ultrasound guidance: Using a 20-gauge needle I advanced into 2 visible pockets in the prepatellar bursa which was already somewhat decompressed, I aspirated approximately 1 to 2 cc of serosanguineous cloudy fluid, syringe switched and 1 cc kenalog 40, 1 cc lidocaine injected easily.  The knee was then strapped with compressive dressing. Completed without difficulty  Pain immediately resolved suggesting accurate placement of the medication.  Advised to call if fevers/chills, erythema, induration, drainage, or persistent bleeding.  Images permanently stored and available for review in the ultrasound unit.  Impression: Technically successful ultrasound guided injection.  Impression and Recommendations:    Pseudogout right prepatellar bursa Unclear etiology but considering the rapid onset, warmth and swelling this is probably crystalline. Aspiration, injection. Crystal analysis. Strapped with compressive dressing. Return to see me in 2 weeks.  Synovial fluid analysis is positive for calcium pyrophosphate crystals indicating that this episode of prepatellar bursitis is secondary to pseudogout rather than gout.  For further episodes we will use colchicine 0.6 mg twice a day. ___________________________________________ Gwen Her. Dianah Field, M.D., ABFM., CAQSM. Primary Care and [place] Instructor of [place] of [facility] of Medicine

## 2018-04-25 NOTE — Assessment & Plan Note (Addendum)
Unclear etiology but considering the rapid onset, warmth and swelling this is probably crystalline. Aspiration, injection. Crystal analysis. Strapped with compressive dressing. Return to see me in 2 weeks.  Synovial fluid analysis is positive for calcium pyrophosphate crystals indicating that this episode of prepatellar bursitis is secondary to pseudogout rather than gout.  For further episodes we will use colchicine 0.6 mg twice a day.

## 2018-04-26 ENCOUNTER — Encounter: Payer: Self-pay | Admitting: Family Medicine

## 2018-04-26 LAB — SYNOVIAL FLUID, CRYSTAL

## 2018-04-30 DIAGNOSIS — M545 Low back pain: Secondary | ICD-10-CM | POA: Diagnosis not present

## 2018-05-07 ENCOUNTER — Ambulatory Visit (INDEPENDENT_AMBULATORY_CARE_PROVIDER_SITE_OTHER): Payer: Medicare Other | Admitting: Sports Medicine

## 2018-05-07 ENCOUNTER — Encounter: Payer: Self-pay | Admitting: Sports Medicine

## 2018-05-07 DIAGNOSIS — M1711 Unilateral primary osteoarthritis, right knee: Secondary | ICD-10-CM | POA: Diagnosis not present

## 2018-05-07 NOTE — Progress Notes (Signed)
Subjective:    CC: Recheck knee  HPI: This is a pleasant 72 year old female, we did an aspiration and injection of her right prepatellar bursa at the last visit, crystal analysis showed calcium pyrophosphate crystals confirming pseudogout as a diagnosis, since then her anterior knee pain, swelling, redness has been better.  She has had a recurrence of pain at the joint line, known osteoarthritis that responded well in the past to NSAIDs, as well as single injection, we have not yet done viscosupplementation, pain is mostly at the lateral joint line, moderate, persistent without radiation or mechanical symptoms.  I reviewed the past medical history, family history, social history, surgical history, and allergies today and no changes were needed.  Please see the problem list section below in epic for further details.  Past Medical History: Past Medical History:  Diagnosis Date  . Carpal tunnel syndrome   . Colon polyp   . Kidney stones   . Renal cyst    benign   Past Surgical History: Past Surgical History:  Procedure Laterality Date  . ABDOMINAL SURGERY  12/16/2016   Pancreaticoduodenal aortic aneurysm repair  . BREAST REDUCTION SURGERY  2013  . HERNIA REPAIR  1996  . KIDNEY STONE SURGERY  2015  . KNEE ARTHROSCOPY    . TONSILLECTOMY  1957  . TRIGGER FINGER RELEASE  07/2015  . UPPER GASTROINTESTINAL ENDOSCOPY     Social History: Social History   Socioeconomic History  . Marital status: Married    Spouse name: Patrick Jupiter  . Number of children: 3  . Years of education: 90  . Highest education level: Not on file  Occupational History  . Occupation: Retired  Scientific laboratory technician  . Financial resource strain: Not on file  . Food insecurity:    Worry: Not on file    Inability: Not on file  . Transportation needs:    Medical: Not on file    Non-medical: Not on file  Tobacco Use  . Smoking status: Never Smoker  . Smokeless tobacco: Never Used  Substance and Sexual Activity  . Alcohol  use: No  . Drug use: No  . Sexual activity: Not Currently  Lifestyle  . Physical activity:    Days per week: Not on file    Minutes per session: Not on file  . Stress: Not on file  Relationships  . Social connections:    Talks on phone: Not on file    Gets together: Not on file    Attends religious service: Not on file    Active member of club or organization: Not on file    Attends meetings of clubs or organizations: Not on file    Relationship status: Not on file  Other Topics Concern  . Not on file  Social History Narrative   Retired. High school degree. Married to [place]. 3 adult children. Currently lives with her spouse.   Family History: Family History  Problem Relation Age of Onset  . Heart failure Mother   . Heart failure Father   . Heart attack Father   . Diabetes Brother   . Cancer Maternal Aunt   . Cancer Maternal Uncle   . Ovarian cancer Unknown   . Lung cancer Unknown   . Throat cancer Unknown   . Stroke Maternal Grandmother    Allergies: Allergies  Allergen Reactions  . Other Itching    Steri strips at time of 12/2016 surg  . Tape Itching    Steri strips at time of 12/2016 surg  .  Codeine Nausea And Vomiting and Other (See Comments)    GI Upset  . Hydrocodone Nausea And Vomiting   Medications: See med rec.  Review of Systems: No fevers, chills, night sweats, weight loss, chest pain, or shortness of breath.   Objective:    General: Well Developed, well nourished, and in no acute distress.  Neuro: Alert and oriented x3, extra-ocular muscles intact, sensation grossly intact.  HEENT: Normocephalic, atraumatic, pupils equal round reactive to light, neck supple, no masses, no lymphadenopathy, thyroid nonpalpable.  Skin: Warm and dry, no rashes. Cardiac: Regular rate and rhythm, no murmurs rubs or gallops, no lower extremity edema.  Respiratory: Clear to auscultation bilaterally. Not using accessory muscles, speaking in full sentences. Right knee: Normal  to inspection with no erythema or effusion or obvious bony abnormalities. Tender to palpation of the medial and lateral joint lines. ROM normal in flexion and extension and lower leg rotation. Ligaments with solid consistent endpoints including ACL, PCL, LCL, MCL. Negative Mcmurray's and provocative meniscal tests. Non painful patellar compression. Patellar and quadriceps tendons unremarkable. Prepatellar bursitis is resolved. Hamstring and quadriceps strength is normal.  Impression and Recommendations:    Primary osteoarthritis of right knee Improved with naproxen, giving her some Vimovo samples. Rehab exercises, return if no better in a few weeks to consider injection. We also have Visco supplementation as an option should she desire. ___________________________________________ Gwen Her. Dianah Field, M.D., ABFM., CAQSM. Primary Care and [place] Instructor of [place] of [facility] of Medicine

## 2018-05-07 NOTE — Assessment & Plan Note (Signed)
Improved with naproxen, giving her some Vimovo samples. Rehab exercises, return if no better in a few weeks to consider injection. We also have Visco supplementation as an option should she desire.

## 2018-05-14 ENCOUNTER — Ambulatory Visit: Payer: Medicare Other | Admitting: Sports Medicine

## 2018-06-11 ENCOUNTER — Encounter: Payer: Self-pay | Admitting: Family Medicine

## 2018-06-11 ENCOUNTER — Ambulatory Visit (INDEPENDENT_AMBULATORY_CARE_PROVIDER_SITE_OTHER): Payer: Medicare Other | Admitting: Family Medicine

## 2018-06-11 VITALS — BP 118/56 | HR 60 | Ht 62.0 in | Wt 158.0 lb

## 2018-06-11 DIAGNOSIS — E119 Type 2 diabetes mellitus without complications: Secondary | ICD-10-CM

## 2018-06-11 DIAGNOSIS — I10 Essential (primary) hypertension: Secondary | ICD-10-CM | POA: Diagnosis not present

## 2018-06-11 DIAGNOSIS — M7041 Prepatellar bursitis, right knee: Secondary | ICD-10-CM

## 2018-06-11 DIAGNOSIS — F5101 Primary insomnia: Secondary | ICD-10-CM | POA: Diagnosis not present

## 2018-06-11 LAB — POCT GLYCOSYLATED HEMOGLOBIN (HGB A1C): Hemoglobin A1C: 6 % — AB (ref 4.0–5.6)

## 2018-06-11 NOTE — Progress Notes (Signed)
Subjective:    CC: Diabetes  HPI:  Diabetes - no hypoglycemic events. No wounds or sores that are not healing well. No increased thirst or urination. Checking glucose at home. Taking medications as prescribed without any side effects.  Saw Dr. Darene Lamer on June 26th and had a knee aspiration done that day, and noticed a know popped about about 2 weeks ago.  It burns but doesn't feel hot to touch.  She has not been doing any kneeling or crouching or putting direct pressure on that knee.  She says if she does need to kneel she always puts pressure on her left knee and not her right since she had probably an issue with pseudogout previously.  She was trying to do a lot of stretches and exercises with physical therapy but has even backed off on those recently.  Hypertension- Pt denies chest pain, SOB, dizziness, or heart palpitations.  Taking meds as directed w/o problems.  Denies medication side effects.    Follow up insomnia-she is been trying to start splitting her Ambien in half.  Though occasionally she will still wake up and have to take the second half of the tab.  Past medical history, Surgical history, Family history not pertinant except as noted below, Social history, Allergies, and medications have been entered into the medical record, reviewed, and corrections made.   Review of Systems: No fevers, chills, night sweats, weight loss, chest pain, or shortness of breath.   Objective:    General: Well Developed, well nourished, and in no acute distress.  Neuro: Alert and oriented x3, extra-ocular muscles intact, sensation grossly intact.  HEENT: Normocephalic, atraumatic  Skin: Warm and dry, no rashes. Cardiac: Regular rate and rhythm, no murmurs rubs or gallops, no lower extremity edema.  Respiratory: Clear to auscultation bilaterally. Not using accessory muscles, speaking in full sentences.   Impression and Recommendations:   DM - Well controlled. Continue current regimen. Follow up in  4  months.    Lab Results  Component Value Date   HGBA1C 6.0 (A) 06/11/2018   Prepatellar bursitis, right-discussed diagnosis.  New Dx.  Probably from overuse.  She has been taking an anti-inflammatory as well as icing it but has not tried compression yet.  Will refer to sports medicine for I&D.  You ice and anti-inflammatory as needed.  HTN - Well controlled. Continue current regimen. Follow up in  4 months.    Insomnia-Doing well.  She is attempting to taper down her Ambien.

## 2018-06-11 NOTE — Patient Instructions (Signed)
Recommend an Ace wrap or compression for the bursitis while waiting to get in with Dr. Dianah Field or Dr. Georgina Snell.

## 2018-06-15 ENCOUNTER — Ambulatory Visit (INDEPENDENT_AMBULATORY_CARE_PROVIDER_SITE_OTHER): Payer: Medicare Other | Admitting: Sports Medicine

## 2018-06-15 ENCOUNTER — Encounter: Payer: Self-pay | Admitting: Sports Medicine

## 2018-06-15 DIAGNOSIS — L02415 Cutaneous abscess of right lower limb: Secondary | ICD-10-CM

## 2018-06-15 MED ORDER — TRAMADOL HCL 50 MG PO TABS: 50.0000 mg | ORAL_TABLET | Freq: Three times a day (TID) | ORAL | 0 refills | Status: DC | PRN

## 2018-06-15 MED ORDER — DOXYCYCLINE HYCLATE 100 MG PO TABS: 100.0000 mg | ORAL_TABLET | Freq: Two times a day (BID) | ORAL | 0 refills | Status: AC

## 2018-06-15 NOTE — Progress Notes (Signed)
Subjective:    CC: Right knee swelling  HPI: This is a pleasant 72 year old female, many months ago she had a prepatellar bursitis on the right, it was aspirated, and ultimately determined to be related to pseudogout.  More recently she developed increasing swelling, very localized in the sub-cutaneous tissues of the anterior right knee.  Moderate warmth, severe pain.  No fevers, chills, constitutional symptoms.  Localized without radiation.  I reviewed the past medical history, family history, social history, surgical history, and allergies today and no changes were needed.  Please see the problem list section below in epic for further details.  Past Medical History: Past Medical History:  Diagnosis Date  . Carpal tunnel syndrome   . Colon polyp   . Kidney stones   . Renal cyst    benign   Past Surgical History: Past Surgical History:  Procedure Laterality Date  . ABDOMINAL SURGERY  12/16/2016   Pancreaticoduodenal aortic aneurysm repair  . BREAST REDUCTION SURGERY  2013  . HERNIA REPAIR  1996  . KIDNEY STONE SURGERY  2015  . KNEE ARTHROSCOPY    . TONSILLECTOMY  1957  . TRIGGER FINGER RELEASE  07/2015  . UPPER GASTROINTESTINAL ENDOSCOPY     Social History: Social History   Socioeconomic History  . Marital status: Married    Spouse name: Patrick Jupiter  . Number of children: 3  . Years of education: 41  . Highest education level: Not on file  Occupational History  . Occupation: Retired  Scientific laboratory technician  . Financial resource strain: Not on file  . Food insecurity:    Worry: Not on file    Inability: Not on file  . Transportation needs:    Medical: Not on file    Non-medical: Not on file  Tobacco Use  . Smoking status: Never Smoker  . Smokeless tobacco: Never Used  Substance and Sexual Activity  . Alcohol use: No  . Drug use: No  . Sexual activity: Not Currently  Lifestyle  . Physical activity:    Days per week: Not on file    Minutes per session: Not on file  .  Stress: Not on file  Relationships  . Social connections:    Talks on phone: Not on file    Gets together: Not on file    Attends religious service: Not on file    Active member of club or organization: Not on file    Attends meetings of clubs or organizations: Not on file    Relationship status: Not on file  Other Topics Concern  . Not on file  Social History Narrative   Retired. High school degree. Married to [place]. 3 adult children. Currently lives with her spouse.   Family History: Family History  Problem Relation Age of Onset  . Heart failure Mother   . Heart failure Father   . Heart attack Father   . Diabetes Brother   . Cancer Maternal Aunt   . Cancer Maternal Uncle   . Ovarian cancer Unknown   . Lung cancer Unknown   . Throat cancer Unknown   . Stroke Maternal Grandmother    Allergies: Allergies  Allergen Reactions  . Other Itching    Steri strips at time of 12/2016 surg  . Tape Itching    Steri strips at time of 12/2016 surg  . Codeine Nausea And Vomiting and Other (See Comments)    GI Upset  . Hydrocodone Nausea And Vomiting   Medications: See med rec.  Review of  Systems: No fevers, chills, night sweats, weight loss, chest pain, or shortness of breath.   Objective:    General: Well Developed, well nourished, and in no acute distress.  Neuro: Alert and oriented x3, extra-ocular muscles intact, sensation grossly intact.  HEENT: Normocephalic, atraumatic, pupils equal round reactive to light, neck supple, no masses, no lymphadenopathy, thyroid nonpalpable.  Skin: Warm and dry, no rashes. Cardiac: Regular rate and rhythm, no murmurs rubs or gallops, no lower extremity edema.  Respiratory: Clear to auscultation bilaterally. Not using accessory muscles, speaking in full sentences. Right knee: Visible mass under the anterior knee skin, palpable, erythematous, hot to palpation. No joint effusion. ROM normal in flexion and extension and lower leg  rotation. Ligaments with solid consistent endpoints including ACL, PCL, LCL, MCL. Negative Mcmurray's and provocative meniscal tests. Non painful patellar compression. Patellar and quadriceps tendons unremarkable. Hamstring and quadriceps strength is normal.  Procedure: Diagnostic Ultrasound of right knee Device: GE Logiq E  Findings: I imaged the structure and question, there was no hypoechoic change on the anterior, there was slightly hyperechoic material inside consistent with a solid collection.  This is consistent with abscess. Images permanently stored and available for review in the ultrasound unit.  Impression: Right knee skin abscess, no joint involvement.  Incision and drainage of abscess. Risks, benefits, and alternatives explained and consent obtained. Time out conducted. Surface cleaned with alcohol. 10cc lidocaine with epinephine infiltrated around abscess. Adequate anesthesia ensured. Area prepped and draped in a sterile fashion. #11 blade used to make a stab incision into abscess. Pus expressed with pressure. Curved hemostat used to explore 4 quadrants and loculations broken up. Further purulence expressed. Hemostasis achieved. Pt stable. Aftercare and follow-up advised.  Impression and Recommendations:    Abscess of skin of right knee Incision and drainage, cultures obtained. Doxycycline, tramadol. Return to see me in a week.  ___________________________________________ Gwen Her. Dianah Field, M.D., ABFM., CAQSM. Primary Care and [place] Instructor of [place] of [facility] of Medicine

## 2018-06-15 NOTE — Assessment & Plan Note (Signed)
Incision and drainage, cultures obtained. Doxycycline, tramadol. Return to see me in a week.

## 2018-06-15 NOTE — Patient Instructions (Signed)
Incision and Drainage, Care After Refer to this sheet in the next few weeks. These instructions provide you with information about caring for yourself after your procedure. Your health care provider may also give you more specific instructions. Your treatment has been planned according to current medical practices, but problems sometimes occur. Call your health care provider if you have any problems or questions after your procedure. What can I expect after the procedure? After the procedure, it is common to have:  Pain or discomfort around your incision site.  Drainage from your incision.  Follow these instructions at home:  Take over-the-counter and prescription medicines only as told by your health care provider.  If you were prescribed an antibiotic medicine, take it as told by your health care provider.Do not stop taking the antibiotic even if you start to feel better.  Followinstructions from your health care provider about: ? How to take care of your incision. ? When and how you should change your packing and bandage (dressing). Wash your hands with soap and water before you change your dressing. If soap and water are not available, use hand sanitizer. ? When you should remove your dressing.  Do not take baths, swim, or use a hot tub until your health care provider approves.  Keep all follow-up visits as told by your health care provider. This is important.  Check your incision area every day for signs of infection. Check for: ? More redness, swelling, or pain. ? More fluid or blood. ? Warmth. ? Pus or a bad smell. Contact a health care provider if:  Your cyst or abscess returns.  You have a fever.  You have more redness, swelling, or pain around your incision.  You have more fluid or blood coming from your incision.  Your incision feels warm to the touch.  You have pus or a bad smell coming from your incision. Get help right away if:  You have severe pain or  bleeding.  You cannot eat or drink without vomiting.  You have decreased urine output.  You become short of breath.  You have chest pain.  You cough up blood.  The area where the incision and drainage occurred becomes numb or it tingles. This information is not intended to replace advice given to you by your health care provider. Make sure you discuss any questions you have with your health care provider. Document Released: 01/09/2012 Document Revised: 03/18/2016 Document Reviewed: 08/07/2015 Elsevier Interactive Patient Education  2018 Elsevier Inc.  

## 2018-06-18 LAB — WOUND CULTURE
MICRO NUMBER:: 90977314
SPECIMEN QUALITY:: ADEQUATE

## 2018-06-25 ENCOUNTER — Ambulatory Visit (INDEPENDENT_AMBULATORY_CARE_PROVIDER_SITE_OTHER): Payer: Medicare Other | Admitting: Sports Medicine

## 2018-06-25 ENCOUNTER — Encounter: Payer: Self-pay | Admitting: Sports Medicine

## 2018-06-25 DIAGNOSIS — L02415 Cutaneous abscess of right lower limb: Secondary | ICD-10-CM

## 2018-06-25 MED ORDER — NAPROXEN 500 MG PO TABS: 500.0000 mg | ORAL_TABLET | Freq: Two times a day (BID) | ORAL | 3 refills | Status: AC

## 2018-06-25 MED ORDER — LEVOFLOXACIN 750 MG PO TABS: 750.0000 mg | ORAL_TABLET | Freq: Every day | ORAL | 0 refills | Status: DC

## 2018-06-25 NOTE — Assessment & Plan Note (Signed)
Nearly symptom-free now after I&D, culture grew pansensitive staph aureus, switching to levofloxacin, continue warm compresses. Return as needed.

## 2018-06-25 NOTE — Progress Notes (Signed)
  Subjective: A week ago I did an incision and drainage of her right knee abscess, doing well.  Objective: General: Well-developed, well-nourished, and in no acute distress. Right knee: Normal to inspection with no erythema or effusion or obvious bony abnormalities. Slight fullness at the I&D site but no erythema, induration or pain. ROM normal in flexion and extension and lower leg rotation. Ligaments with solid consistent endpoints including ACL, PCL, LCL, MCL. Negative Mcmurray's and provocative meniscal tests. Non painful patellar compression. Patellar and quadriceps tendons unremarkable. Hamstring and quadriceps strength is normal.  Assessment/plan:   Abscess of skin of right knee Nearly symptom-free now after I&D, culture grew pansensitive staph aureus, switching to levofloxacin, continue warm compresses. Return as needed. ___________________________________________ Gwen Her. Dianah Field, M.D., ABFM., CAQSM. Primary Care and [place] Instructor of [place] of [facility] of Medicine

## 2018-07-24 DIAGNOSIS — M25569 Pain in unspecified knee: Secondary | ICD-10-CM | POA: Diagnosis not present

## 2018-07-25 ENCOUNTER — Other Ambulatory Visit: Payer: Self-pay | Admitting: Family Medicine

## 2018-07-30 DIAGNOSIS — M25461 Effusion, right knee: Secondary | ICD-10-CM | POA: Diagnosis not present

## 2018-07-30 DIAGNOSIS — M25561 Pain in right knee: Secondary | ICD-10-CM | POA: Diagnosis not present

## 2018-08-02 DIAGNOSIS — M25569 Pain in unspecified knee: Secondary | ICD-10-CM | POA: Diagnosis not present

## 2018-08-07 DIAGNOSIS — Z23 Encounter for immunization: Secondary | ICD-10-CM | POA: Diagnosis not present

## 2018-09-25 ENCOUNTER — Other Ambulatory Visit: Payer: Self-pay | Admitting: Family Medicine

## 2018-09-25 NOTE — Telephone Encounter (Signed)
Routing to pcp for signature.[name] [name], CMA  

## 2018-10-15 ENCOUNTER — Encounter: Payer: Self-pay | Admitting: Family Medicine

## 2018-10-15 ENCOUNTER — Ambulatory Visit (INDEPENDENT_AMBULATORY_CARE_PROVIDER_SITE_OTHER): Payer: Medicare Other | Admitting: Family Medicine

## 2018-10-15 VITALS — BP 129/52 | HR 69 | Ht 62.0 in | Wt 150.0 lb

## 2018-10-15 DIAGNOSIS — F419 Anxiety disorder, unspecified: Secondary | ICD-10-CM | POA: Diagnosis not present

## 2018-10-15 DIAGNOSIS — F329 Major depressive disorder, single episode, unspecified: Secondary | ICD-10-CM

## 2018-10-15 DIAGNOSIS — I10 Essential (primary) hypertension: Secondary | ICD-10-CM

## 2018-10-15 DIAGNOSIS — F5101 Primary insomnia: Secondary | ICD-10-CM

## 2018-10-15 DIAGNOSIS — E119 Type 2 diabetes mellitus without complications: Secondary | ICD-10-CM | POA: Diagnosis not present

## 2018-10-15 LAB — POCT GLYCOSYLATED HEMOGLOBIN (HGB A1C): Hemoglobin A1C: 5.9 % — AB (ref 4.0–5.6)

## 2018-10-15 MED ORDER — AMLODIPINE BESYLATE 5 MG PO TABS: 5.0000 mg | ORAL_TABLET | Freq: Every day | ORAL | 1 refills | Status: DC

## 2018-10-15 MED ORDER — HYDROCHLOROTHIAZIDE 12.5 MG PO TABS
12.5000 mg | ORAL_TABLET | Freq: Every day | ORAL | 1 refills | Status: DC
Start: 2018-10-15 — End: 2019-09-09

## 2018-10-15 MED ORDER — LOSARTAN POTASSIUM 50 MG PO TABS: 50.0000 mg | ORAL_TABLET | Freq: Every day | ORAL | 1 refills | Status: DC

## 2018-10-15 NOTE — Progress Notes (Signed)
Subjective:    CC: DM, BP  HPI:  Diabetes - no hypoglycemic events. No wounds or sores that are not healing well. No increased thirst or urination. Checking glucose at home. Taking medications as prescribed without any side effects. She is exercising 3 days per week.    Hypertension- Pt denies chest pain, SOB, dizziness, or heart palpitations.  Taking meds as directed w/o problems.  Denies medication side effects.  nees her losartan HCT split apart.    F/U insomnia - Still occ have to take a 2nd tab in the middle of the night but not often.   F/U depression/Anxiety - she is doing OK overall.    For her knee OA she takes celebrex daily. Sees Dr. Ennis Forts.    She has also been having some locking of her left thumb. Says will notice it locking in the morning, rather than later in the day.    Past medical history, Surgical history, Family history not pertinant except as noted below, Social history, Allergies, and medications have been entered into the medical record, reviewed, and corrections made.   Review of Systems: No fevers, chills, night sweats, weight loss, chest pain, or shortness of breath.   Objective:    General: Well Developed, well nourished, and in no acute distress.  Neuro: Alert and oriented x3, extra-ocular muscles intact, sensation grossly intact.  HEENT: Normocephalic, atraumatic  Skin: Warm and dry, no rashes. Cardiac: Regular rate and rhythm, no murmurs rubs or gallops, no lower extremity edema.  Respiratory: Clear to auscultation bilaterally. Not using accessory muscles, speaking in full sentences.   Impression and Recommendations:    DM - Well controlled. Continue current regimen. Follow up in  4 months.    HTN  - Well controlled. Continue current regimen. Follow up in  3-4 months.  Due for BMP.    Insomnia - Ok to refill medication.   Trigger finger/thumb pain, left   Right now she just wants to monitor it but certainly if he feels like it is happening more  often or if it becomes painful then please follow-up with 1 of our sports medicine providers.

## 2018-10-16 LAB — BASIC METABOLIC PANEL
BUN/Creatinine Ratio: 23 (ref 12–28)
BUN: 15 mg/dL (ref 8–27)
CHLORIDE: 103 mmol/L (ref 96–106)
CO2: 23 mmol/L (ref 20–29)
Calcium: 8.9 mg/dL (ref 8.7–10.3)
Creatinine, Ser: 0.64 mg/dL (ref 0.57–1.00)
GFR calc Af Amer: 104 mL/min/{1.73_m2} (ref >59)
GFR calc non Af Amer: 90 mL/min/{1.73_m2} (ref >59)
Glucose: 90 mg/dL (ref 65–99)
POTASSIUM: 3.9 mmol/L (ref 3.5–5.2)
Sodium: 141 mmol/L (ref 134–144)

## 2018-10-16 NOTE — Progress Notes (Signed)
All labs are normal. 

## 2018-12-18 DIAGNOSIS — L299 Pruritus, unspecified: Secondary | ICD-10-CM | POA: Diagnosis not present

## 2018-12-18 DIAGNOSIS — Z85828 Personal history of other malignant neoplasm of skin: Secondary | ICD-10-CM | POA: Diagnosis not present

## 2018-12-18 DIAGNOSIS — L309 Dermatitis, unspecified: Secondary | ICD-10-CM | POA: Diagnosis not present

## 2018-12-18 DIAGNOSIS — L821 Other seborrheic keratosis: Secondary | ICD-10-CM | POA: Diagnosis not present

## 2018-12-18 DIAGNOSIS — I781 Nevus, non-neoplastic: Secondary | ICD-10-CM | POA: Diagnosis not present

## 2018-12-18 DIAGNOSIS — D229 Melanocytic nevi, unspecified: Secondary | ICD-10-CM | POA: Diagnosis not present

## 2018-12-18 DIAGNOSIS — D1801 Hemangioma of skin and subcutaneous tissue: Secondary | ICD-10-CM | POA: Diagnosis not present

## 2018-12-26 DIAGNOSIS — M79671 Pain in right foot: Secondary | ICD-10-CM | POA: Diagnosis not present

## 2018-12-28 DIAGNOSIS — I728 Aneurysm of other specified arteries: Secondary | ICD-10-CM | POA: Diagnosis not present

## 2018-12-28 DIAGNOSIS — Z48812 Encounter for surgical aftercare following surgery on the circulatory system: Secondary | ICD-10-CM | POA: Diagnosis not present

## 2018-12-28 DIAGNOSIS — I708 Atherosclerosis of other arteries: Secondary | ICD-10-CM | POA: Diagnosis not present

## 2019-01-02 DIAGNOSIS — H00014 Hordeolum externum left upper eyelid: Secondary | ICD-10-CM | POA: Diagnosis not present

## 2019-01-08 DIAGNOSIS — M79645 Pain in left finger(s): Secondary | ICD-10-CM | POA: Diagnosis not present

## 2019-01-21 ENCOUNTER — Other Ambulatory Visit: Payer: Self-pay | Admitting: Family Medicine

## 2019-03-19 ENCOUNTER — Encounter: Payer: Self-pay | Admitting: Family Medicine

## 2019-03-19 DIAGNOSIS — H524 Presbyopia: Secondary | ICD-10-CM | POA: Diagnosis not present

## 2019-03-19 DIAGNOSIS — H2513 Age-related nuclear cataract, bilateral: Secondary | ICD-10-CM | POA: Diagnosis not present

## 2019-03-19 DIAGNOSIS — H43813 Vitreous degeneration, bilateral: Secondary | ICD-10-CM | POA: Diagnosis not present

## 2019-04-09 DIAGNOSIS — M25531 Pain in right wrist: Secondary | ICD-10-CM | POA: Diagnosis not present

## 2019-04-11 DIAGNOSIS — Z1231 Encounter for screening mammogram for malignant neoplasm of breast: Secondary | ICD-10-CM | POA: Diagnosis not present

## 2019-04-11 LAB — HM MAMMOGRAPHY

## 2019-04-17 ENCOUNTER — Ambulatory Visit: Payer: Medicare Other | Admitting: Family Medicine

## 2019-04-30 ENCOUNTER — Ambulatory Visit: Payer: Medicare Other | Admitting: Family Medicine

## 2019-05-02 ENCOUNTER — Encounter: Payer: Self-pay | Admitting: Family Medicine

## 2019-05-06 ENCOUNTER — Encounter: Payer: Self-pay | Admitting: Family Medicine

## 2019-05-06 ENCOUNTER — Other Ambulatory Visit: Payer: Self-pay

## 2019-05-06 ENCOUNTER — Ambulatory Visit (INDEPENDENT_AMBULATORY_CARE_PROVIDER_SITE_OTHER): Payer: Medicare Other | Admitting: Family Medicine

## 2019-05-06 VITALS — BP 116/58 | HR 70 | Ht 62.0 in | Wt 163.0 lb

## 2019-05-06 DIAGNOSIS — E559 Vitamin D deficiency, unspecified: Secondary | ICD-10-CM

## 2019-05-06 DIAGNOSIS — I728 Aneurysm of other specified arteries: Secondary | ICD-10-CM

## 2019-05-06 DIAGNOSIS — I10 Essential (primary) hypertension: Secondary | ICD-10-CM | POA: Diagnosis not present

## 2019-05-06 DIAGNOSIS — Z1159 Encounter for screening for other viral diseases: Secondary | ICD-10-CM

## 2019-05-06 DIAGNOSIS — E119 Type 2 diabetes mellitus without complications: Secondary | ICD-10-CM | POA: Diagnosis not present

## 2019-05-06 LAB — POCT GLYCOSYLATED HEMOGLOBIN (HGB A1C): Hemoglobin A1C: 6.1 % — AB (ref 4.0–5.6)

## 2019-05-06 MED ORDER — AMBULATORY NON FORMULARY MEDICATION: 0 refills | Status: DC

## 2019-05-06 MED ORDER — CELECOXIB 200 MG PO CAPS: 200.0000 mg | ORAL_CAPSULE | Freq: Two times a day (BID) | ORAL | 6 refills | Status: DC

## 2019-05-06 NOTE — Assessment & Plan Note (Signed)
Followed by yearly duplex by vascular surgery.

## 2019-05-06 NOTE — Assessment & Plan Note (Signed)
Well controlled. Continue current regimen. Follow up in  4 mo 

## 2019-05-06 NOTE — Progress Notes (Signed)
Established Patient Office Visit  Subjective:  Patient ID: Jasmine Fitzgerald, female    DOB: 06-Feb-1946  Age: 73 y.o. MRN: [id number]  CC:  Chief Complaint  Patient presents with  . Diabetes    HPI Jasmine Fitzgerald presents for   Hypertension- Pt denies chest pain, SOB, dizziness, or heart palpitations. Taking meds as directed w/o problems.  Denies medication side effects.   Diabetes - no hypoglycemic events. No wounds or sores that are not healing well. No increased thirst or urination. Checking glucose at home. Taking medications as prescribed without any side effects. Last eye exam was in April.    F/U s/p surgery for aneurysm.  - now followed by yearly duplex.  She is doing well though she was told she had a little bit of narrowing this last time there is done to follow her back up in 1 year.  She also wanted to let me know that she is been going to [place] for a left trigger thumb and for some tendinitis in her right wrist.  She recently received some injections.  Past Medical History:  Diagnosis Date  . Carpal tunnel syndrome   . Colon polyp   . Kidney stones   . Renal cyst    benign    Past Surgical History:  Procedure Laterality Date  . ABDOMINAL SURGERY  12/16/2016   Pancreaticoduodenal aortic aneurysm repair  . BREAST REDUCTION SURGERY  2013  . HERNIA REPAIR  1996  . KIDNEY STONE SURGERY  2015  . KNEE ARTHROSCOPY    . TONSILLECTOMY  1957  . TRIGGER FINGER RELEASE  07/2015  . UPPER GASTROINTESTINAL ENDOSCOPY      Family History  Problem Relation Age of Onset  . Heart failure Mother   . Heart failure Father   . Heart attack Father   . Diabetes Brother   . Cancer Maternal Aunt   . Cancer Maternal Uncle   . Ovarian cancer Unknown   . Lung cancer Unknown   . Throat cancer Unknown   . Stroke Maternal Grandmother     Social History   Socioeconomic History  . Marital status: Married    Spouse name: Patrick Jupiter  . Number of children: 3  . Years of education:  50  . Highest education level: Not on file  Occupational History  . Occupation: Retired  Scientific laboratory technician  . Financial resource strain: Not on file  . Food insecurity    Worry: Not on file    Inability: Not on file  . Transportation needs    Medical: Not on file    Non-medical: Not on file  Tobacco Use  . Smoking status: Never Smoker  . Smokeless tobacco: Never Used  Substance and Sexual Activity  . Alcohol use: No  . Drug use: No  . Sexual activity: Not Currently  Lifestyle  . Physical activity    Days per week: Not on file    Minutes per session: Not on file  . Stress: Not on file  Relationships  . Social Herbalist on phone: Not on file    Gets together: Not on file    Attends religious service: Not on file    Active member of club or organization: Not on file    Attends meetings of clubs or organizations: Not on file    Relationship status: Not on file  . Intimate partner violence    Fear of current or ex partner: Not on file  Emotionally abused: Not on file    Physically abused: Not on file    Forced sexual activity: Not on file  Other Topics Concern  . Not on file  Social History Narrative   Retired. High school degree. Married to [place]. 3 adult children. Currently lives with her spouse.    Outpatient Medications Prior to Visit  Medication Sig Dispense Refill  . ALPRAZolam (XANAX) 0.25 MG tablet Take 1 tablet (0.25 mg total) by mouth at bedtime as needed for sleep. 30 tablet 1  . amLODipine (NORVASC) 5 MG tablet Take 1 tablet (5 mg total) by mouth daily. 90 tablet 1  . aspirin 81 MG tablet Take by mouth.    . Biotin 5000 MCG CAPS Take 1 capsule by mouth daily.    . blood glucose meter kit and supplies KIT Dispense based on patient and insurance preference. Use up to once daily as directed. (FOR ICD-9 250.00, 250.01). 1 each PRN  . clobetasol (TEMOVATE) 0.05 % external solution Apply to scalp once or twice daily when needed for itching.  5  .  cyclobenzaprine (FLEXERIL) 10 MG tablet Take 10 mg by mouth 3 (three) times daily as needed for muscle spasms.    . hydrochlorothiazide (HYDRODIURIL) 12.5 MG tablet Take 1 tablet (12.5 mg total) by mouth daily. 90 tablet 1  . losartan (COZAAR) 50 MG tablet Take 1 tablet (50 mg total) by mouth daily. 90 tablet 1  . naproxen (NAPROSYN) 500 MG tablet Take 1 tablet (500 mg total) by mouth 2 (two) times daily with a meal. 60 tablet 3  . omeprazole (PRILOSEC) 20 MG capsule     . triamcinolone cream (KENALOG) 0.1 % Apply to irritated & itchy areas of the body once or twice daily when needed. May mix in equal parts with moisturizer. Not to face.  3  . TRUE METRIX BLOOD GLUCOSE TEST test strip USE UP TO ONCE DAILY AS DIRECTED 50 each 3  . Turmeric (CURCUMIN 95 PO) Take 3 tablets by mouth daily.    Marland Kitchen zolpidem (AMBIEN) 5 MG tablet Take 1 tablet (5 mg total) by mouth at bedtime as needed for sleep. 30 tablet 5   No facility-administered medications prior to visit.     Allergies  Allergen Reactions  . Other Itching    Steri strips at time of 12/2016 surg  . Tape Itching    Steri strips at time of 12/2016 surg  . Codeine Nausea And Vomiting and Other (See Comments)    GI Upset  . Hydrocodone Nausea And Vomiting    ROS Review of Systems    Objective:    Physical Exam  Constitutional: She is oriented to person, place, and time. She appears well-developed and well-nourished.  HENT:  Head: Normocephalic and atraumatic.  Cardiovascular: Normal rate, regular rhythm and normal heart sounds.  Pulmonary/Chest: Effort normal and breath sounds normal.  Neurological: She is alert and oriented to person, place, and time.  Skin: Skin is warm and dry.  Psychiatric: She has a normal mood and affect. Her behavior is normal.    BP (!) 116/58   Pulse 70   Ht 5' 2" (1.575 m)   Wt 163 lb (73.9 kg)   SpO2 100%   BMI 29.81 kg/m  Wt Readings from Last 3 Encounters:  05/06/19 163 lb (73.9 kg)  10/15/18 150  lb (68 kg)  06/25/18 159 lb (72.1 kg)     Health Maintenance Due  Topic Date Due  . Hepatitis C Screening  [birth year]  . TETANUS/TDAP  10/31/2018  . OPHTHALMOLOGY EXAM  12/28/2018    There are no preventive care reminders to display for this patient.  Lab Results  Component Value Date   TSH 0.64 02/02/2016   Lab Results  Component Value Date   WBC 6.2 02/02/2016   HGB 13.1 02/02/2016   HCT 38 02/02/2016   PLT 217 02/02/2016   Lab Results  Component Value Date   NA 141 10/15/2018   K 3.9 10/15/2018   CO2 23 10/15/2018   GLUCOSE 90 10/15/2018   BUN 15 10/15/2018   CREATININE 0.64 10/15/2018   BILITOT 0.8 03/06/2017   ALKPHOS 34 03/06/2017   AST 13 03/06/2017   ALT 9 03/06/2017   PROT 6.8 03/06/2017   ALBUMIN 4.2 03/06/2017   CALCIUM 8.9 10/15/2018   Lab Results  Component Value Date   CHOL 200 (H) 03/14/2018   Lab Results  Component Value Date   HDL 87 03/14/2018   Lab Results  Component Value Date   LDLCALC 101 (H) 03/14/2018   Lab Results  Component Value Date   TRIG 62 03/14/2018   Lab Results  Component Value Date   CHOLHDL 2.3 03/14/2018   Lab Results  Component Value Date   HGBA1C 6.1 (A) 05/06/2019      Assessment & Plan:   Problem List Items Addressed This Visit      Cardiovascular and Mediastinum   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      Essential hypertension, benign - Primary    Well controlled. Continue current regimen. Follow up in  4 mo      Relevant Orders   Lipid panel   COMPLETE METABOLIC PANEL WITH GFR   TSH   CBC with Differential/Platelet     Endocrine   Controlled type 2 diabetes mellitus without complication, without long-term current use of insulin (HCC)    Well controlled. Continue current regimen. Follow up in  4 mo.  A1C of 6.1 today.       Relevant Orders   POCT glycosylated hemoglobin (Hb A1C) (Completed)   Lipid panel   COMPLETE METABOLIC  PANEL WITH GFR   TSH   CBC with Differential/Platelet     Other   Vitamin D deficiency   Relevant Orders   VITAMIN D 25 Hydroxy (Vit-D Deficiency, Fractures)    Other Visit Diagnoses    Encounter for hepatitis C screening test for low risk patient       Relevant Orders   Hepatitis C Antibody      Due for Hep C screening.   Rx for Tdap given to get at the pharmacy.      Meds ordered this encounter  Medications  . celecoxib (CELEBREX) 200 MG capsule    Sig: Take 1 capsule (200 mg total) by mouth 2 (two) times daily.    Dispense:  60 capsule    Refill:  6  . AMBULATORY NON FORMULARY MEDICATION    Sig: Medication Name: Tdap IM x 1.    Dispense:  1 vial    Refill:  0    Follow-up: Return in about 4 months (around 09/06/2019) for Diabetes follow-up.    Beatrice Lecher, MD

## 2019-05-06 NOTE — Assessment & Plan Note (Signed)
Well controlled. Continue current regimen. Follow up in  4 mo.  A1C of 6.1 today.

## 2019-05-07 ENCOUNTER — Other Ambulatory Visit: Payer: Self-pay | Admitting: Family Medicine

## 2019-05-07 DIAGNOSIS — E119 Type 2 diabetes mellitus without complications: Secondary | ICD-10-CM | POA: Diagnosis not present

## 2019-05-07 DIAGNOSIS — I10 Essential (primary) hypertension: Secondary | ICD-10-CM | POA: Diagnosis not present

## 2019-05-07 DIAGNOSIS — E559 Vitamin D deficiency, unspecified: Secondary | ICD-10-CM | POA: Diagnosis not present

## 2019-05-08 LAB — COMPREHENSIVE METABOLIC PANEL
ALT: 9 IU/L (ref 0–32)
AST: 15 IU/L (ref 0–40)
Albumin/Globulin Ratio: 1.9 (ref 1.2–2.2)
Albumin: 4.2 g/dL (ref 3.7–4.7)
Alkaline Phosphatase: 37 IU/L — ABNORMAL LOW (ref 39–117)
BUN/Creatinine Ratio: 19 (ref 12–28)
BUN: 14 mg/dL (ref 8–27)
Bilirubin Total: 0.9 mg/dL (ref 0.0–1.2)
CO2: 23 mmol/L (ref 20–29)
Calcium: 9.3 mg/dL (ref 8.7–10.3)
Chloride: 101 mmol/L (ref 96–106)
Creatinine, Ser: 0.73 mg/dL (ref 0.57–1.00)
GFR calc Af Amer: 96 mL/min/{1.73_m2} (ref >59)
GFR calc non Af Amer: 83 mL/min/{1.73_m2} (ref >59)
Globulin, Total: 2.2 g/dL (ref 1.5–4.5)
Glucose: 96 mg/dL (ref 65–99)
Potassium: 4.2 mmol/L (ref 3.5–5.2)
Sodium: 139 mmol/L (ref 134–144)
Total Protein: 6.4 g/dL (ref 6.0–8.5)

## 2019-05-08 LAB — CBC WITH DIFFERENTIAL/PLATELET
Basophils Absolute: 0.1 10*3/uL (ref 0.0–0.2)
Basos: 1 %
EOS (ABSOLUTE): 0.3 10*3/uL (ref 0.0–0.4)
Eos: 5 %
Hematocrit: 38.8 % (ref 34.0–46.6)
Hemoglobin: 13.1 g/dL (ref 11.1–15.9)
Immature Grans (Abs): 0 10*3/uL (ref 0.0–0.1)
Immature Granulocytes: 0 %
Lymphocytes Absolute: 1.5 10*3/uL (ref 0.7–3.1)
Lymphs: 26 %
MCH: 31.3 pg (ref 26.6–33.0)
MCHC: 33.8 g/dL (ref 31.5–35.7)
MCV: 93 fL (ref 79–97)
Monocytes Absolute: 0.7 10*3/uL (ref 0.1–0.9)
Monocytes: 13 %
Neutrophils Absolute: 3.1 10*3/uL (ref 1.4–7.0)
Neutrophils: 55 %
Platelets: 209 10*3/uL (ref 150–450)
RBC: 4.18 x10E6/uL (ref 3.77–5.28)
RDW: 12.4 % (ref 11.7–15.4)
WBC: 5.7 10*3/uL (ref 3.4–10.8)

## 2019-05-08 LAB — LIPID PANEL W/O CHOL/HDL RATIO
Cholesterol, Total: 191 mg/dL (ref 100–199)
HDL: 80 mg/dL (ref >39)
LDL Calculated: 100 mg/dL — ABNORMAL HIGH (ref 0–99)
Triglycerides: 57 mg/dL (ref 0–149)
VLDL Cholesterol Cal: 11 mg/dL (ref 5–40)

## 2019-05-08 LAB — SPECIMEN STATUS REPORT

## 2019-05-08 LAB — VITAMIN D 25 HYDROXY (VIT D DEFICIENCY, FRACTURES): Vit D, 25-Hydroxy: 30.3 ng/mL (ref 30.0–100.0)

## 2019-05-08 LAB — HEPATITIS C ANTIBODY: Hep C Virus Ab: <0.1 s/co ratio (ref 0.0–0.9)

## 2019-05-08 LAB — TSH: TSH: 1.18 u[IU]/mL (ref 0.450–4.500)

## 2019-05-08 NOTE — Progress Notes (Deleted)
Subjective:   Jasmine Fitzgerald is a 73 y.o. female who presents for Medicare Annual (Subsequent) preventive examination.  Review of Systems:  No ROS.  Medicare Wellness Virtual Visit.  Visual/audio telehealth visit, UTA vital signs.   See social history for additional risk factors.      Sleep patterns:  Home Safety/Smoke Alarms: Feels safe in home. Smoke alarms in place.  Living environment;  Seat Belt Safety/Bike Helmet: Wears seat belt.   Female:   Pap- Aged out      Mammo-  UTD     Dexa scan-        CCS- UTD      Objective:     Vitals: There were no vitals taken for this visit.  There is no height or weight on file to calculate BMI.  Advanced Directives 03/27/2018 05/31/2016 02/15/2016  Does Patient Have a Medical Advance Directive? Yes Yes Yes  Type of Paramedic of [place];Living will [place];Living will [place];Living will;Out of facility DNR (pink MOST or yellow form)  Does patient want to make changes to medical advance directive? - - No - Patient declined  Copy of [place] in Chart? No - copy requested No - copy requested No - copy requested    Tobacco Social History   Tobacco Use  Smoking Status Never Smoker  Smokeless Tobacco Never Used     Counseling given: Not Answered   Clinical Intake:                       Past Medical History:  Diagnosis Date  . Carpal tunnel syndrome   . Colon polyp   . Kidney stones   . Renal cyst    benign   Past Surgical History:  Procedure Laterality Date  . ABDOMINAL SURGERY  12/16/2016   Pancreaticoduodenal aortic aneurysm repair  . BREAST REDUCTION SURGERY  2013  . HERNIA REPAIR  1996  . KIDNEY STONE SURGERY  2015  . KNEE ARTHROSCOPY    . TONSILLECTOMY  1957  . TRIGGER FINGER RELEASE  07/2015  . UPPER GASTROINTESTINAL ENDOSCOPY     Family History  Problem Relation Age of Onset  . Heart failure Mother   . Heart  failure Father   . Heart attack Father   . Diabetes Brother   . Cancer Maternal Aunt   . Cancer Maternal Uncle   . Ovarian cancer Unknown   . Lung cancer Unknown   . Throat cancer Unknown   . Stroke Maternal Grandmother    Social History   Socioeconomic History  . Marital status: Married    Spouse name: Jasmine Fitzgerald  . Number of children: 3  . Years of education: 54  . Highest education level: Not on file  Occupational History  . Occupation: Retired  Scientific laboratory technician  . Financial resource strain: Not on file  . Food insecurity    Worry: Not on file    Inability: Not on file  . Transportation needs    Medical: Not on file    Non-medical: Not on file  Tobacco Use  . Smoking status: Never Smoker  . Smokeless tobacco: Never Used  Substance and Sexual Activity  . Alcohol use: No  . Drug use: No  . Sexual activity: Not Currently  Lifestyle  . Physical activity    Days per week: Not on file    Minutes per session: Not on file  . Stress: Not  on file  Relationships  . Social Herbalist on phone: Not on file    Gets together: Not on file    Attends religious service: Not on file    Active member of club or organization: Not on file    Attends meetings of clubs or organizations: Not on file    Relationship status: Not on file  Other Topics Concern  . Not on file  Social History Narrative   Retired. High school degree. Married to [place]. 3 adult children. Currently lives with her spouse.    Outpatient Encounter Medications as of 05/13/2019  Medication Sig  . ALPRAZolam (XANAX) 0.25 MG tablet Take 1 tablet (0.25 mg total) by mouth at bedtime as needed for sleep.  . AMBULATORY NON FORMULARY MEDICATION Medication Name: Tdap IM x 1.  . amLODipine (NORVASC) 5 MG tablet Take 1 tablet (5 mg total) by mouth daily.  Marland Kitchen aspirin 81 MG tablet Take by mouth.  . Biotin 5000 MCG CAPS Take 1 capsule by mouth daily.  . blood glucose meter kit and supplies KIT Dispense based on patient and  insurance preference. Use up to once daily as directed. (FOR ICD-9 250.00, 250.01).  . celecoxib (CELEBREX) 200 MG capsule Take 1 capsule (200 mg total) by mouth 2 (two) times daily.  . clobetasol (TEMOVATE) 0.05 % external solution Apply to scalp once or twice daily when needed for itching.  . cyclobenzaprine (FLEXERIL) 10 MG tablet Take 10 mg by mouth 3 (three) times daily as needed for muscle spasms.  . hydrochlorothiazide (HYDRODIURIL) 12.5 MG tablet Take 1 tablet (12.5 mg total) by mouth daily.  Marland Kitchen losartan (COZAAR) 50 MG tablet Take 1 tablet (50 mg total) by mouth daily.  . naproxen (NAPROSYN) 500 MG tablet Take 1 tablet (500 mg total) by mouth 2 (two) times daily with a meal.  . omeprazole (PRILOSEC) 20 MG capsule   . triamcinolone cream (KENALOG) 0.1 % Apply to irritated & itchy areas of the body once or twice daily when needed. May mix in equal parts with moisturizer. Not to face.  . TRUE METRIX BLOOD GLUCOSE TEST test strip USE UP TO ONCE DAILY AS DIRECTED  . Turmeric (CURCUMIN 95 PO) Take 3 tablets by mouth daily.  Marland Kitchen zolpidem (AMBIEN) 5 MG tablet Take 1 tablet (5 mg total) by mouth at bedtime as needed for sleep.   No facility-administered encounter medications on file as of 05/13/2019.     Activities of Daily Living No flowsheet data found.  Patient Care Team: Jasmine Marry, MD as PCP - General (Family Medicine) Jasmine Beers, MD as Referring Physician (Vascular Surgery) Jasmine Loan, MD as Referring Physician (Gastroenterology) Fitzgerald, Jasmine Eves, MD (Orthopedic Surgery)    Assessment:   This is a routine wellness examination for [name].Physical assessment deferred to PCP.   Exercise Activities and Dietary recommendations   Diet Breakfast: Lunch:  Dinner:       Goals   None     Fall Risk Fall Risk  05/06/2019 03/27/2018 03/06/2017 02/15/2016  Falls in the past year? 0 No No No   Is the patient's home free of loose throw rugs in walkways, pet beds,  electrical cords, etc?   {Blank single:19197::"yes","no"}      Grab bars in the bathroom? {Blank single:19197::"yes","no"}      Handrails on the stairs?   {Blank single:19197::"yes","no"}      Adequate lighting?   {Blank single:19197::"yes","no"}  Depression Screen [facility] 2/9 Scores 03/27/2018 07/10/2017 07/04/2017  05/26/2017  PHQ - 2 Score 1 0 0 0  PHQ- 9 Score - - - -     Cognitive Function     6CIT Screen 03/27/2018 03/14/2017  What Year? 0 points 0 points  What month? 0 points 0 points  What time? 0 points 0 points  Count back from 20 0 points 0 points  Months in reverse 0 points 0 points  Repeat phrase 0 points 0 points  Total Score 0 0    Immunization History  Administered Date(s) Administered  . Influenza Split 07/02/2015  . Influenza, High Dose Seasonal PF 07/10/2017  . Influenza, Seasonal, Injecte, Preservative Fre 08/08/2013, 07/28/2014  . Influenza,inj,Quad PF,6+ Mos 08/08/2013, 07/02/2015, 07/18/2016  . Influenza,inj,quad, With Preservative 07/28/2014  . Pneumococcal Conjugate-13 02/02/2015  . Pneumococcal Polysaccharide-23 10/31/2012  . Tdap 09/11/2008, 10/31/2008  . Zoster 05/06/2009, 10/31/2012  . Zoster Recombinat (Shingrix) 03/14/2017, 01/19/2018    Screening Tests Health Maintenance  Topic Date Due  . Hepatitis C Screening  Mar 11, 1946  . TETANUS/TDAP  10/31/2018  . OPHTHALMOLOGY EXAM  12/28/2018  . INFLUENZA VACCINE  06/01/2019  . FOOT EXAM  06/12/2019  . HEMOGLOBIN A1C  11/06/2019  . MAMMOGRAM  04/10/2020  . COLONOSCOPY  11/26/2024  . DEXA SCAN  Completed  . PNA vac Low Risk Adult  Completed        Plan:   ***   I have personally reviewed and noted the following in the patient's chart:   . Medical and social history . Use of alcohol, tobacco or illicit drugs  . Current medications and supplements . Functional ability and status . Nutritional status . Physical activity . Advanced directives . List of other physicians . Hospitalizations,  surgeries, and ER visits in previous 12 months . Vitals . Screenings to include cognitive, depression, and falls . Referrals and appointments  In addition, I have reviewed and discussed with patient certain preventive protocols, quality metrics, and best practice recommendations. A written personalized care plan for preventive services as well as general preventive health recommendations were provided to patient.     Joanne Chars, LPN  07/06/7226

## 2019-05-13 ENCOUNTER — Ambulatory Visit: Payer: Medicare Other

## 2019-05-31 ENCOUNTER — Other Ambulatory Visit: Payer: Self-pay

## 2019-07-24 ENCOUNTER — Other Ambulatory Visit: Payer: Self-pay | Admitting: Family Medicine

## 2019-08-03 DIAGNOSIS — L03116 Cellulitis of left lower limb: Secondary | ICD-10-CM | POA: Diagnosis not present

## 2019-08-03 DIAGNOSIS — Z23 Encounter for immunization: Secondary | ICD-10-CM | POA: Diagnosis not present

## 2019-08-25 ENCOUNTER — Other Ambulatory Visit: Payer: Self-pay | Admitting: Family Medicine

## 2019-08-25 DIAGNOSIS — I10 Essential (primary) hypertension: Secondary | ICD-10-CM

## 2019-09-09 ENCOUNTER — Other Ambulatory Visit: Payer: Self-pay

## 2019-09-09 ENCOUNTER — Ambulatory Visit (INDEPENDENT_AMBULATORY_CARE_PROVIDER_SITE_OTHER): Payer: Medicare Other | Admitting: Family Medicine

## 2019-09-09 ENCOUNTER — Encounter: Payer: Self-pay | Admitting: Family Medicine

## 2019-09-09 VITALS — BP 118/53 | HR 69 | Ht 62.0 in | Wt 166.0 lb

## 2019-09-09 DIAGNOSIS — K2100 Gastro-esophageal reflux disease with esophagitis, without bleeding: Secondary | ICD-10-CM

## 2019-09-09 DIAGNOSIS — I10 Essential (primary) hypertension: Secondary | ICD-10-CM

## 2019-09-09 DIAGNOSIS — F329 Major depressive disorder, single episode, unspecified: Secondary | ICD-10-CM

## 2019-09-09 DIAGNOSIS — F419 Anxiety disorder, unspecified: Secondary | ICD-10-CM | POA: Diagnosis not present

## 2019-09-09 DIAGNOSIS — E119 Type 2 diabetes mellitus without complications: Secondary | ICD-10-CM | POA: Diagnosis not present

## 2019-09-09 LAB — POCT GLYCOSYLATED HEMOGLOBIN (HGB A1C): Hemoglobin A1C: 5.8 % — AB (ref 4.0–5.6)

## 2019-09-09 MED ORDER — HYDROCHLOROTHIAZIDE 12.5 MG PO TABS: 12.5000 mg | ORAL_TABLET | Freq: Every day | ORAL | 1 refills | Status: DC

## 2019-09-09 MED ORDER — LOSARTAN POTASSIUM 50 MG PO TABS: 50.0000 mg | ORAL_TABLET | Freq: Every day | ORAL | 1 refills | Status: DC

## 2019-09-09 MED ORDER — AMLODIPINE BESYLATE 5 MG PO TABS: 5.0000 mg | ORAL_TABLET | Freq: Every day | ORAL | 1 refills | Status: DC

## 2019-09-09 MED ORDER — ALPRAZOLAM 0.25 MG PO TABS: 0.2500 mg | ORAL_TABLET | Freq: Every evening | ORAL | 1 refills | Status: DC | PRN

## 2019-09-09 MED ORDER — OMEPRAZOLE 20 MG PO CPDR: 20.0000 mg | DELAYED_RELEASE_CAPSULE | Freq: Every day | ORAL | 1 refills | Status: DC

## 2019-09-09 NOTE — Assessment & Plan Note (Signed)
Will refill omeprazole.  New prescription sent to pharmacy.  Just encouraged her to wean and taper as soon as she is doing well also encouraged her to take a look at her diet and see if there are any anything that could be triggering this.

## 2019-09-09 NOTE — Assessment & Plan Note (Signed)
Well controlled. Continue current regimen. Follow up in  6 mo  

## 2019-09-09 NOTE — Assessment & Plan Note (Signed)
Well controlled. Continue current regimen. Follow up in  4 mo 

## 2019-09-09 NOTE — Progress Notes (Signed)
Established Patient Office Visit  Subjective:  Patient ID: Jasmine Fitzgerald, female    DOB: [birth year]  Age: 73 y.o. MRN: [id number]  CC:  Chief Complaint  Patient presents with  . Diabetes    eye exam done [facility] medical release faxed  . Hypertension  . Gastroesophageal Reflux    HPI Jasmine Fitzgerald presents for   Diabetes - no hypoglycemic events. No wounds or sores that are not healing well. No increased thirst or urination. Checking glucose at home. Taking medications as prescribed without any side effects.due for foot exam.  She admits her diet has not been great recently.  She has been getting into some sweets. Hypertension- Pt denies chest pain, SOB, dizziness, or heart palpitations.  Taking meds as directed w/o problems.  Denies medication side effects.    GERD - has been getting a lot of relux lately. Used to see GI for it. Would like a refill on the omeprazole.  She will notice that even when she bends over she will feel the food come back up in her esophagus.  She is to take omeprazole couple years ago but has been able to do well without it until recently.  Dropped a guitar on her leg on 10/13. Says it is healing well. _0  Anxiety -she would like a refill on the alprazolam.  We usually give her 30 with 1 refill yearly and she says she rarely uses it but likes to just have it on hand in case.   Past Medical History:  Diagnosis Date  . Carpal tunnel syndrome   . Colon polyp   . Kidney stones   . Renal cyst    benign    Past Surgical History:  Procedure Laterality Date  . ABDOMINAL SURGERY  12/16/2016   Pancreaticoduodenal aortic aneurysm repair  . BREAST REDUCTION SURGERY  2013  . HERNIA REPAIR  1996  . KIDNEY STONE SURGERY  2015  . KNEE ARTHROSCOPY    . TONSILLECTOMY  1957  . TRIGGER FINGER RELEASE  07/2015  . UPPER GASTROINTESTINAL ENDOSCOPY      Family History  Problem Relation Age of Onset  . Heart failure Mother   . Heart failure Father   . Heart  attack Father   . Diabetes Brother   . Cancer Maternal Aunt   . Cancer Maternal Uncle   . Ovarian cancer Unknown   . Lung cancer Unknown   . Throat cancer Unknown   . Stroke Maternal Grandmother     Social History   Socioeconomic History  . Marital status: Married    Spouse name: Patrick Jupiter  . Number of children: 3  . Years of education: 66  . Highest education level: Not on file  Occupational History  . Occupation: Retired  Scientific laboratory technician  . Financial resource strain: Not on file  . Food insecurity    Worry: Not on file    Inability: Not on file  . Transportation needs    Medical: Not on file    Non-medical: Not on file  Tobacco Use  . Smoking status: Never Smoker  . Smokeless tobacco: Never Used  Substance and Sexual Activity  . Alcohol use: No  . Drug use: No  . Sexual activity: Not Currently  Lifestyle  . Physical activity    Days per week: Not on file    Minutes per session: Not on file  . Stress: Not on file  Relationships  . Social Herbalist on  phone: Not on file    Gets together: Not on file    Attends religious service: Not on file    Active member of club or organization: Not on file    Attends meetings of clubs or organizations: Not on file    Relationship status: Not on file  . Intimate partner violence    Fear of current or ex partner: Not on file    Emotionally abused: Not on file    Physically abused: Not on file    Forced sexual activity: Not on file  Other Topics Concern  . Not on file  Social History Narrative   Retired. High school degree. Married to [place]. 3 adult children. Currently lives with her spouse.    Outpatient Medications Prior to Visit  Medication Sig Dispense Refill  . aspirin 81 MG tablet Take by mouth.    . blood glucose meter kit and supplies KIT Dispense based on patient and insurance preference. Use up to once daily as directed. (FOR ICD-9 250.00, 250.01). 1 each PRN  . celecoxib (CELEBREX) 200 MG capsule Take 1  capsule (200 mg total) by mouth 2 (two) times daily. 60 capsule 6  . Cholecalciferol (VITAMIN D3) 25 MCG (1000 UT) CAPS Take 2 capsules by mouth daily.    . clobetasol (TEMOVATE) 0.05 % external solution Apply to scalp once or twice daily when needed for itching.  5  . Multiple Vitamins-Minerals (ZINC PO) Take by mouth.    . RESTASIS 0.05 % ophthalmic emulsion INSTILL ONE DROP IN BOTH EYES TWICE A DAY AS DIRECTED    . triamcinolone cream (KENALOG) 0.1 % Apply to irritated & itchy areas of the body once or twice daily when needed. May mix in equal parts with moisturizer. Not to face.  3  . TRUE METRIX BLOOD GLUCOSE TEST test strip USE UP TO ONCE DAILY AS DIRECTED 50 each 3  . Turmeric (CURCUMIN 95 PO) Take 3 tablets by mouth daily.    Marland Kitchen zolpidem (AMBIEN) 5 MG tablet Take 1 tablet (5 mg total) by mouth at bedtime as needed for sleep. 30 tablet 5  . ALPRAZolam (XANAX) 0.25 MG tablet Take 1 tablet (0.25 mg total) by mouth at bedtime as needed for sleep. 30 tablet 1  . amLODipine (NORVASC) 5 MG tablet Take 1 tablet (5 mg total) by mouth daily. 90 tablet 1  . hydrochlorothiazide (HYDRODIURIL) 12.5 MG tablet Take 1 tablet (12.5 mg total) by mouth daily. 90 tablet 1  . losartan (COZAAR) 50 MG tablet Take 1 tablet (50 mg total) by mouth daily. 90 tablet 1  . omeprazole (PRILOSEC) 20 MG capsule     . AMBULATORY NON FORMULARY MEDICATION Medication Name: Tdap IM x 1. 1 vial 0  . Biotin 5000 MCG CAPS Take 1 capsule by mouth daily.    . cyclobenzaprine (FLEXERIL) 10 MG tablet Take 10 mg by mouth 3 (three) times daily as needed for muscle spasms.     No facility-administered medications prior to visit.     Allergies  Allergen Reactions  . Other Itching    Steri strips at time of 12/2016 surg  . Tape Itching    Steri strips at time of 12/2016 surg  . Codeine Nausea And Vomiting and Other (See Comments)    GI Upset  . Hydrocodone Nausea And Vomiting    ROS Review of Systems    Objective:     Physical Exam  Constitutional: She is oriented to person, place, and time. She appears  well-developed and well-nourished.  HENT:  Head: Normocephalic and atraumatic.  Neck: Neck supple. No thyromegaly present.  Cardiovascular: Normal rate, regular rhythm and normal heart sounds.  Pulmonary/Chest: Effort normal and breath sounds normal.  Lymphadenopathy:    She has no cervical adenopathy.  Neurological: She is alert and oriented to person, place, and time.  Skin: Skin is warm and dry.  Psychiatric: She has a normal mood and affect. Her behavior is normal.    BP (!) 118/53   Pulse 69   Ht 5' 2" (1.575 m)   Wt 166 lb (75.3 kg)   SpO2 99%   BMI 30.36 kg/m  Wt Readings from Last 3 Encounters:  09/09/19 166 lb (75.3 kg)  05/06/19 163 lb (73.9 kg)  10/15/18 150 lb (68 kg)     Health Maintenance Due  Topic Date Due  . OPHTHALMOLOGY EXAM  12/28/2018    There are no preventive care reminders to display for this patient.  Lab Results  Component Value Date   TSH 1.180 05/07/2019   Lab Results  Component Value Date   WBC 5.7 05/07/2019   HGB 13.1 05/07/2019   HCT 38.8 05/07/2019   MCV 93 05/07/2019   PLT 209 05/07/2019   Lab Results  Component Value Date   NA 139 05/07/2019   K 4.2 05/07/2019   CO2 23 05/07/2019   GLUCOSE 96 05/07/2019   BUN 14 05/07/2019   CREATININE 0.73 05/07/2019   BILITOT 0.9 05/07/2019   ALKPHOS 37 (L) 05/07/2019   AST 15 05/07/2019   ALT 9 05/07/2019   PROT 6.4 05/07/2019   ALBUMIN 4.2 05/07/2019   CALCIUM 9.3 05/07/2019   Lab Results  Component Value Date   CHOL 191 05/07/2019   Lab Results  Component Value Date   HDL 80 05/07/2019   Lab Results  Component Value Date   LDLCALC 100 (H) 05/07/2019   Lab Results  Component Value Date   TRIG 57 05/07/2019   Lab Results  Component Value Date   CHOLHDL 2.3 03/14/2018   Lab Results  Component Value Date   HGBA1C 5.8 (A) 09/09/2019      Assessment & Plan:   Problem List  Items Addressed This Visit      Cardiovascular and Mediastinum   Essential hypertension, benign    Well controlled. Continue current regimen. Follow up in  4 mo      Relevant Medications   amLODipine (NORVASC) 5 MG tablet   hydrochlorothiazide (HYDRODIURIL) 12.5 MG tablet   losartan (COZAAR) 50 MG tablet     Digestive   GERD (gastroesophageal reflux disease)    Will refill omeprazole.  New prescription sent to pharmacy.  Just encouraged her to wean and taper as soon as she is doing well also encouraged her to take a look at her diet and see if there are any anything that could be triggering this.      Relevant Medications   omeprazole (PRILOSEC) 20 MG capsule     Endocrine   Controlled type 2 diabetes mellitus without complication, without long-term current use of insulin ([place]) - Primary    Well controlled. Continue current regimen. Follow up in  64mo     Relevant Medications   losartan (COZAAR) 50 MG tablet   Other Relevant Orders   POCT HgB A1C (Completed)     Other   Anxiety and depression    Will refill alprazolam for as needed use only.      Relevant Medications  ALPRAZolam (XANAX) 0.25 MG tablet      Meds ordered this encounter  Medications  . ALPRAZolam (XANAX) 0.25 MG tablet    Sig: Take 1 tablet (0.25 mg total) by mouth at bedtime as needed for sleep.    Dispense:  30 tablet    Refill:  1  . amLODipine (NORVASC) 5 MG tablet    Sig: Take 1 tablet (5 mg total) by mouth daily.    Dispense:  90 tablet    Refill:  1  . hydrochlorothiazide (HYDRODIURIL) 12.5 MG tablet    Sig: Take 1 tablet (12.5 mg total) by mouth daily.    Dispense:  90 tablet    Refill:  1  . losartan (COZAAR) 50 MG tablet    Sig: Take 1 tablet (50 mg total) by mouth daily.    Dispense:  90 tablet    Refill:  1  . omeprazole (PRILOSEC) 20 MG capsule    Sig: Take 1 capsule (20 mg total) by mouth daily.    Dispense:  90 capsule    Refill:  1    Follow-up: Return in about 4 months  (around 01/07/2020) for Diabetes follow-up.    Beatrice Lecher, MD

## 2019-09-09 NOTE — Assessment & Plan Note (Signed)
Will refill alprazolam for as needed use only.

## 2019-12-20 DIAGNOSIS — Z1283 Encounter for screening for malignant neoplasm of skin: Secondary | ICD-10-CM | POA: Diagnosis not present

## 2019-12-20 DIAGNOSIS — L309 Dermatitis, unspecified: Secondary | ICD-10-CM | POA: Diagnosis not present

## 2019-12-20 DIAGNOSIS — Z85828 Personal history of other malignant neoplasm of skin: Secondary | ICD-10-CM | POA: Diagnosis not present

## 2019-12-20 DIAGNOSIS — L299 Pruritus, unspecified: Secondary | ICD-10-CM | POA: Diagnosis not present

## 2020-01-02 DIAGNOSIS — K551 Chronic vascular disorders of intestine: Secondary | ICD-10-CM | POA: Diagnosis not present

## 2020-01-02 DIAGNOSIS — Z48812 Encounter for surgical aftercare following surgery on the circulatory system: Secondary | ICD-10-CM | POA: Diagnosis not present

## 2020-01-03 DIAGNOSIS — Z86010 Personal history of colonic polyps: Secondary | ICD-10-CM | POA: Diagnosis not present

## 2020-01-03 DIAGNOSIS — D125 Benign neoplasm of sigmoid colon: Secondary | ICD-10-CM | POA: Diagnosis not present

## 2020-01-03 DIAGNOSIS — K635 Polyp of colon: Secondary | ICD-10-CM | POA: Diagnosis not present

## 2020-01-06 LAB — HM COLONOSCOPY

## 2020-01-07 ENCOUNTER — Other Ambulatory Visit: Payer: Self-pay | Admitting: *Deleted

## 2020-01-07 ENCOUNTER — Ambulatory Visit (INDEPENDENT_AMBULATORY_CARE_PROVIDER_SITE_OTHER): Payer: Medicare Other | Admitting: Family Medicine

## 2020-01-07 ENCOUNTER — Other Ambulatory Visit: Payer: Self-pay

## 2020-01-07 ENCOUNTER — Encounter: Payer: Self-pay | Admitting: Family Medicine

## 2020-01-07 VITALS — BP 133/59 | HR 64 | Ht 62.0 in | Wt 162.0 lb

## 2020-01-07 DIAGNOSIS — E119 Type 2 diabetes mellitus without complications: Secondary | ICD-10-CM

## 2020-01-07 DIAGNOSIS — I10 Essential (primary) hypertension: Secondary | ICD-10-CM | POA: Diagnosis not present

## 2020-01-07 DIAGNOSIS — N281 Cyst of kidney, acquired: Secondary | ICD-10-CM

## 2020-01-07 DIAGNOSIS — K219 Gastro-esophageal reflux disease without esophagitis: Secondary | ICD-10-CM

## 2020-01-07 DIAGNOSIS — F5101 Primary insomnia: Secondary | ICD-10-CM

## 2020-01-07 MED ORDER — AMBULATORY NON FORMULARY MEDICATION: 0 refills | Status: AC

## 2020-01-07 NOTE — Assessment & Plan Note (Signed)
Doing well on Ambien.Marland Kitchen

## 2020-01-07 NOTE — Progress Notes (Signed)
She has had both COVID vaccines done. Colonoscopy done on 01/03/2020. Yearly eye exam scheduled for May.

## 2020-01-07 NOTE — Progress Notes (Addendum)
Established Patient Office Visit  Subjective:  Patient ID: Jasmine Fitzgerald, female    DOB: 08-06-1946  Age: 74 y.o. MRN: [id number]  CC:  Chief Complaint  Patient presents with  . Diabetes    HPI Jasmine Fitzgerald presents for   Diabetes - no hypoglycemic events. No wounds or sores that are not healing well. No increased thirst or urination. Checking glucose at home. Taking medications as prescribed without any side effects.  She might need a refill for the test or if she thinks they might be expired.  Hypertension- Pt denies chest pain, SOB, dizziness, or heart palpitations.  Taking meds as directed w/o problems.  Denies medication side effects.    Insomnia-doing well with her Ambien.  She says she gets about 6 to 7 hours of sleep on it and feels refreshed in the mornings.   Past Medical History:  Diagnosis Date  . Carpal tunnel syndrome   . Colon polyp   . Kidney stones   . Renal cyst    benign    Past Surgical History:  Procedure Laterality Date  . ABDOMINAL SURGERY  12/16/2016   Pancreaticoduodenal aortic aneurysm repair  . BREAST REDUCTION SURGERY  2013  . HERNIA REPAIR  1996  . KIDNEY STONE SURGERY  2015  . KNEE ARTHROSCOPY    . TONSILLECTOMY  1957  . TRIGGER FINGER RELEASE  07/2015  . UPPER GASTROINTESTINAL ENDOSCOPY      Family History  Problem Relation Age of Onset  . Heart failure Mother   . Heart failure Father   . Heart attack Father   . Diabetes Brother   . Cancer Maternal Aunt   . Cancer Maternal Uncle   . Ovarian cancer Unknown   . Lung cancer Unknown   . Throat cancer Unknown   . Stroke Maternal Grandmother     Social History   Socioeconomic History  . Marital status: Married    Spouse name: Jasmine Fitzgerald  . Number of children: 3  . Years of education: 73  . Highest education level: Not on file  Occupational History  . Occupation: Retired  Tobacco Use  . Smoking status: Never Smoker  . Smokeless tobacco: Never Used  Substance and Sexual Activity   . Alcohol use: No  . Drug use: No  . Sexual activity: Not Currently  Other Topics Concern  . Not on file  Social History Narrative   Retired. High school degree. Married to [place]. 3 adult children. Currently lives with her spouse.   Social Determinants of Health   Financial Resource Strain:   . Difficulty of Paying Living Expenses: Not on file  Food Insecurity:   . Worried About Charity fundraiser in the Last Year: Not on file  . Ran Out of Food in the Last Year: Not on file  Transportation Needs:   . Lack of Transportation (Medical): Not on file  . Lack of Transportation (Non-Medical): Not on file  Physical Activity:   . Days of Exercise per Week: Not on file  . Minutes of Exercise per Session: Not on file  Stress:   . Feeling of Stress : Not on file  Social Connections:   . Frequency of Communication with Friends and Family: Not on file  . Frequency of Social Gatherings with Friends and Family: Not on file  . Attends Religious Services: Not on file  . Active Member of Clubs or Organizations: Not on file  . Attends Archivist Meetings: Not on file  .  Marital Status: Not on file  Intimate Partner Violence:   . Fear of Current or Ex-Partner: Not on file  . Emotionally Abused: Not on file  . Physically Abused: Not on file  . Sexually Abused: Not on file    Outpatient Medications Prior to Visit  Medication Sig Dispense Refill  . ALPRAZolam (XANAX) 0.25 MG tablet Take 1 tablet (0.25 mg total) by mouth at bedtime as needed for sleep. 30 tablet 1  . amLODipine (NORVASC) 5 MG tablet Take 1 tablet (5 mg total) by mouth daily. 90 tablet 1  . aspirin 81 MG tablet Take by mouth.    . blood glucose meter kit and supplies KIT Dispense based on patient and insurance preference. Use up to once daily as directed. (FOR ICD-9 250.00, 250.01). 1 each PRN  . celecoxib (CELEBREX) 200 MG capsule Take 1 capsule (200 mg total) by mouth 2 (two) times daily. 60 capsule 6  .  Cholecalciferol (VITAMIN D3) 25 MCG (1000 UT) CAPS Take 2 capsules by mouth daily.    . clobetasol (TEMOVATE) 0.05 % external solution Apply to scalp once or twice daily when needed for itching.  5  . hydrochlorothiazide (HYDRODIURIL) 12.5 MG tablet Take 1 tablet (12.5 mg total) by mouth daily. 90 tablet 1  . losartan (COZAAR) 50 MG tablet Take 1 tablet (50 mg total) by mouth daily. 90 tablet 1  . omeprazole (PRILOSEC) 20 MG capsule Take 1 capsule (20 mg total) by mouth daily. 90 capsule 1  . triamcinolone cream (KENALOG) 0.1 % Apply to irritated & itchy areas of the body once or twice daily when needed. May mix in equal parts with moisturizer. Not to face.  3  . TRUE METRIX BLOOD GLUCOSE TEST test strip USE UP TO ONCE DAILY AS DIRECTED 50 each 3  . Turmeric (CURCUMIN 95 PO) Take 3 tablets by mouth daily.    Marland Kitchen zinc gluconate 50 MG tablet Take 50 mg by mouth daily.    Marland Kitchen zolpidem (AMBIEN) 5 MG tablet Take 1 tablet (5 mg total) by mouth at bedtime as needed for sleep. 30 tablet 5  . Multiple Vitamins-Minerals (ZINC PO) Take by mouth.     No facility-administered medications prior to visit.    Allergies  Allergen Reactions  . Other Itching    Steri strips at time of 12/2016 surg  . Tape Itching    Steri strips at time of 12/2016 surg  . Codeine Nausea And Vomiting and Other (See Comments)    GI Upset  . Hydrocodone Nausea And Vomiting    ROS Review of Systems    Objective:    Physical Exam  Constitutional: She is oriented to person, place, and time. She appears well-developed and well-nourished.  HENT:  Head: Normocephalic and atraumatic.  Eyes: Conjunctivae are normal.  Neck: No thyromegaly present.  Cardiovascular: Normal rate, regular rhythm and normal heart sounds.  Pulmonary/Chest: Effort normal and breath sounds normal.  Musculoskeletal:     Cervical back: Neck supple.  Lymphadenopathy:    She has no cervical adenopathy.  Neurological: She is alert and oriented to person,  place, and time.  Skin: Skin is warm and dry.  Psychiatric: She has a normal mood and affect. Her behavior is normal.    BP (!) 133/59   Pulse 64   Ht '5\' 2"'  (1.575 m)   Wt 162 lb (73.5 kg)   SpO2 100%   BMI 29.63 kg/m  Wt Readings from Last 3 Encounters:  01/07/20 162  lb (73.5 kg)  09/09/19 166 lb (75.3 kg)  05/06/19 163 lb (73.9 kg)     There are no preventive care reminders to display for this patient.  There are no preventive care reminders to display for this patient.  Lab Results  Component Value Date   TSH 1.180 05/07/2019   Lab Results  Component Value Date   WBC 5.7 05/07/2019   HGB 13.1 05/07/2019   HCT 38.8 05/07/2019   MCV 93 05/07/2019   PLT 209 05/07/2019   Lab Results  Component Value Date   NA 139 05/07/2019   K 4.2 05/07/2019   CO2 23 05/07/2019   GLUCOSE 96 05/07/2019   BUN 14 05/07/2019   CREATININE 0.73 05/07/2019   BILITOT 0.9 05/07/2019   ALKPHOS 37 (L) 05/07/2019   AST 15 05/07/2019   ALT 9 05/07/2019   PROT 6.4 05/07/2019   ALBUMIN 4.2 05/07/2019   CALCIUM 9.3 05/07/2019   Lab Results  Component Value Date   CHOL 191 05/07/2019   Lab Results  Component Value Date   HDL 80 05/07/2019   Lab Results  Component Value Date   LDLCALC 100 (H) 05/07/2019   Lab Results  Component Value Date   TRIG 57 05/07/2019   Lab Results  Component Value Date   CHOLHDL 2.3 03/14/2018   Lab Results  Component Value Date   HGBA1C 5.8 (A) 09/09/2019      Assessment & Plan:   Problem List Items Addressed This Visit      Cardiovascular and Mediastinum   Essential hypertension, benign    Well controlled. Continue current regimen. Follow up in  6 months.       Relevant Orders   HgB M0N   Basic metabolic panel     Digestive   Gastroesophageal reflux disease without esophagitis    Using omeprazole regularly.        Endocrine   Controlled type 2 diabetes mellitus without complication, without long-term current use of insulin  ([place]) - Primary    We will check hemoglobin A1c.  She go to [facility] for her lab work.  She is otherwise doing well and just checks her blood sugar maybe every 3 to 4 weeks.      Relevant Medications   AMBULATORY NON FORMULARY MEDICATION   Other Relevant Orders   HgB U2V   Basic metabolic panel     Genitourinary   Renal cyst     Other   Primary insomnia    Doing well on Ambien..          Colonoscopy is up-to-date.  And she has had both Covid vaccines.  Meds ordered this encounter  Medications  . AMBULATORY NON FORMULARY MEDICATION    Sig: Medication Name: Test strips.  Check glucose 2 times per week. #100.  Dx: Diabetes    Dispense:  1 vial    Refill:  0    Follow-up: Return in about 6 months (around 07/09/2020) for Hypertension.    Beatrice Lecher, MD

## 2020-01-07 NOTE — Assessment & Plan Note (Signed)
Using omeprazole regularly.

## 2020-01-07 NOTE — Assessment & Plan Note (Signed)
We will check hemoglobin A1c.  She go to [facility] for her lab work.  She is otherwise doing well and just checks her blood sugar maybe every 3 to 4 weeks.

## 2020-01-07 NOTE — Addendum Note (Signed)
Addended by: Beatrice Lecher D on: 01/07/2020 12:03 PM   Modules accepted: Orders, Level of Service

## 2020-01-07 NOTE — Assessment & Plan Note (Signed)
Well controlled. Continue current regimen. Follow up in  6 months.  

## 2020-01-08 LAB — BASIC METABOLIC PANEL
BUN/Creatinine Ratio: 24 (ref 12–28)
BUN: 17 mg/dL (ref 8–27)
CO2: 26 mmol/L (ref 20–29)
Calcium: 9.4 mg/dL (ref 8.7–10.3)
Chloride: 104 mmol/L (ref 96–106)
Creatinine, Ser: 0.72 mg/dL (ref 0.57–1.00)
GFR calc Af Amer: 97 mL/min/{1.73_m2} (ref >59)
GFR calc non Af Amer: 84 mL/min/{1.73_m2} (ref >59)
Glucose: 92 mg/dL (ref 65–99)
Potassium: 5.1 mmol/L (ref 3.5–5.2)
Sodium: 142 mmol/L (ref 134–144)

## 2020-01-08 LAB — HEMOGLOBIN A1C
Est. average glucose Bld gHb Est-mCnc: 131 mg/dL
Hgb A1c MFr Bld: 6.2 % — ABNORMAL HIGH (ref 4.8–5.6)

## 2020-01-09 ENCOUNTER — Encounter: Payer: Self-pay | Admitting: Family Medicine

## 2020-01-23 ENCOUNTER — Other Ambulatory Visit: Payer: Self-pay | Admitting: Family Medicine

## 2020-03-25 DIAGNOSIS — H43813 Vitreous degeneration, bilateral: Secondary | ICD-10-CM | POA: Diagnosis not present

## 2020-03-25 DIAGNOSIS — H2513 Age-related nuclear cataract, bilateral: Secondary | ICD-10-CM | POA: Diagnosis not present

## 2020-03-25 DIAGNOSIS — H524 Presbyopia: Secondary | ICD-10-CM | POA: Diagnosis not present

## 2020-03-25 LAB — HM DIABETES EYE EXAM

## 2020-04-20 DIAGNOSIS — Z1231 Encounter for screening mammogram for malignant neoplasm of breast: Secondary | ICD-10-CM | POA: Diagnosis not present

## 2020-04-20 LAB — HM MAMMOGRAPHY

## 2020-05-05 ENCOUNTER — Encounter: Payer: Self-pay | Admitting: Family Medicine

## 2020-07-01 ENCOUNTER — Other Ambulatory Visit: Payer: Self-pay | Admitting: Family Medicine

## 2020-07-09 ENCOUNTER — Encounter: Payer: Self-pay | Admitting: Family Medicine

## 2020-07-09 ENCOUNTER — Ambulatory Visit (INDEPENDENT_AMBULATORY_CARE_PROVIDER_SITE_OTHER): Payer: Medicare Other | Admitting: Family Medicine

## 2020-07-09 VITALS — BP 116/51 | HR 64 | Ht 62.0 in | Wt 149.0 lb

## 2020-07-09 DIAGNOSIS — G479 Sleep disorder, unspecified: Secondary | ICD-10-CM

## 2020-07-09 DIAGNOSIS — E119 Type 2 diabetes mellitus without complications: Secondary | ICD-10-CM | POA: Diagnosis not present

## 2020-07-09 DIAGNOSIS — Z23 Encounter for immunization: Secondary | ICD-10-CM | POA: Diagnosis not present

## 2020-07-09 DIAGNOSIS — I10 Essential (primary) hypertension: Secondary | ICD-10-CM

## 2020-07-09 LAB — POCT GLYCOSYLATED HEMOGLOBIN (HGB A1C): Hemoglobin A1C: 5.7 % — AB (ref 4.0–5.6)

## 2020-07-09 NOTE — Patient Instructions (Signed)
Hold the amlodipine for the next 2 to 3 weeks and monitor blood pressures you want most of the blood pressures in the 120s if possible occasionally can be lower or higher.  If you are still seeing a lot of low blood pressures then please let me know.

## 2020-07-09 NOTE — Assessment & Plan Note (Addendum)
Having some low BPS on 5mg  of amlodpine and 12.5 of HCTZ and losartan 50mg .  Discussed discontinuing the amlodipine.  Just hold it completely for now and monitor blood pressures over the next 2 weeks where shooting for systolics in the 427A on average.  Occasional lows okay and occasional in the 130s is okay but we want most of the blood pressures in the 120 range.  Continue with regular exercise and healthy diet she is really made some fantastic changes over the last 6 months.

## 2020-07-09 NOTE — Assessment & Plan Note (Signed)
Is like her sleep is well controlled with zolpidem 5 mg.  We will continue current regimen.

## 2020-07-09 NOTE — Assessment & Plan Note (Signed)
He is doing phenomenally well.  A1c is now down to 5.7 after his dietary changes.  Continue with healthy diet and regular exercise.  Follow-up in 4 months.

## 2020-07-09 NOTE — Progress Notes (Signed)
Established Patient Office Visit  Subjective:  Patient ID: Jasmine Fitzgerald, female    DOB: [birth year]  Age: 74 y.o. MRN: [id number]  CC:  Chief Complaint  Patient presents with  . Hypertension  . Diabetes    HPI Jasmine Fitzgerald presents for   Hypertension- Pt denies chest pain, SOB, dizziness, or heart palpitations.  Taking meds as directed w/o problems.  Denies medication side effects.  Brought in home BP log, had a few low BPs.  Jasmine Fitzgerald had blood pressures as low as 97/58 and 103/62.  Follow-up diabetes-Jasmine Fitzgerald says Jasmine Fitzgerald is actually worked hard to really cut back on sugar and carb intake and Jasmine Fitzgerald is still exercising regularly that has been constant.  Reports her eye exam is up-to-date.  Jasmine Fitzgerald sees Dr. Hoyle Fitzgerald.   Follow-up insomnia-Jasmine Fitzgerald is currently on zolpidem 5 mg nightly Jasmine Fitzgerald says it really has made a huge difference.  Jasmine Fitzgerald feels like Jasmine Fitzgerald is getting a good 8 hours of sleep nightly.    Past Medical History:  Diagnosis Date  . Carpal tunnel syndrome   . Colon polyp   . Kidney stones   . Renal cyst    benign    Past Surgical History:  Procedure Laterality Date  . ABDOMINAL SURGERY  12/16/2016   Pancreaticoduodenal aortic aneurysm repair  . BREAST REDUCTION SURGERY  2013  . HERNIA REPAIR  1996  . KIDNEY STONE SURGERY  2015  . KNEE ARTHROSCOPY    . TONSILLECTOMY  1957  . TRIGGER FINGER RELEASE  07/2015  . UPPER GASTROINTESTINAL ENDOSCOPY      Family History  Problem Relation Age of Onset  . Heart failure Mother   . Heart failure Father   . Heart attack Father   . Diabetes Brother   . Cancer Maternal Aunt   . Cancer Maternal Uncle   . Ovarian cancer Unknown   . Lung cancer Unknown   . Throat cancer Unknown   . Stroke Maternal Grandmother     Social History   Socioeconomic History  . Marital status: Married    Spouse name: Jasmine Fitzgerald  . Number of children: 3  . Years of education: 28  . Highest education level: Not on file  Occupational History  . Occupation: Retired   Tobacco Use  . Smoking status: Never Smoker  . Smokeless tobacco: Never Used  Substance and Sexual Activity  . Alcohol use: No  . Drug use: No  . Sexual activity: Not Currently  Other Topics Concern  . Not on file  Social History Narrative   Retired. High school degree. Married to [place]. 3 adult children. Currently lives with her spouse.   Social Determinants of Health   Financial Resource Strain:   . Difficulty of Paying Living Expenses: Not on file  Food Insecurity:   . Worried About Charity fundraiser in the Last Year: Not on file  . Ran Out of Food in the Last Year: Not on file  Transportation Needs:   . Lack of Transportation (Medical): Not on file  . Lack of Transportation (Non-Medical): Not on file  Physical Activity:   . Days of Exercise per Week: Not on file  . Minutes of Exercise per Session: Not on file  Stress:   . Feeling of Stress : Not on file  Social Connections:   . Frequency of Communication with Friends and Family: Not on file  . Frequency of Social Gatherings with Friends and Family: Not on file  . Attends Religious Services:  Not on file  . Active Member of Clubs or Organizations: Not on file  . Attends Archivist Meetings: Not on file  . Marital Status: Not on file  Intimate Partner Violence:   . Fear of Current or Ex-Partner: Not on file  . Emotionally Abused: Not on file  . Physically Abused: Not on file  . Sexually Abused: Not on file    Outpatient Medications Prior to Visit  Medication Sig Dispense Refill  . RESTASIS 0.05 % ophthalmic emulsion 1 drop 2 (two) times daily.    Marland Kitchen ALPRAZolam (XANAX) 0.25 MG tablet Take 1 tablet (0.25 mg total) by mouth at bedtime as needed for sleep. 30 tablet 1  . AMBULATORY NON FORMULARY MEDICATION Medication Name: Test strips.  Check glucose 2 times per week. #100.  Dx: Diabetes 1 vial 0  . aspirin 81 MG tablet Take by mouth.    . blood glucose meter kit and supplies KIT Dispense based on patient and  insurance preference. Use up to once daily as directed. (FOR ICD-9 250.00, 250.01). 1 each PRN  . celecoxib (CELEBREX) 200 MG capsule Take 1 capsule (200 mg total) by mouth 2 (two) times daily. 60 capsule 6  . Cholecalciferol (VITAMIN D3) 25 MCG (1000 UT) CAPS Take 2 capsules by mouth daily.    . clobetasol (TEMOVATE) 0.05 % external solution Apply to scalp once or twice daily when needed for itching.  5  . hydrochlorothiazide (HYDRODIURIL) 12.5 MG tablet Take 1 tablet (12.5 mg total) by mouth daily. 90 tablet 1  . losartan (COZAAR) 50 MG tablet Take 1 tablet (50 mg total) by mouth daily. 90 tablet 1  . omeprazole (PRILOSEC) 20 MG capsule Take 1 capsule (20 mg total) by mouth daily. 90 capsule 1  . triamcinolone cream (KENALOG) 0.1 % Apply to irritated & itchy areas of the body once or twice daily when needed. May mix in equal parts with moisturizer. Not to face.  3  . TRUE METRIX BLOOD GLUCOSE TEST test strip USE UP TO ONCE DAILY AS DIRECTED 50 each 3  . Turmeric (CURCUMIN 95 PO) Take 3 tablets by mouth daily.    Marland Kitchen zinc gluconate 50 MG tablet Take 50 mg by mouth daily.    Marland Kitchen zolpidem (AMBIEN) 5 MG tablet Take 1 tablet (5 mg total) by mouth at bedtime as needed for sleep. 30 tablet 5  . amLODipine (NORVASC) 5 MG tablet Take 1 tablet (5 mg total) by mouth daily. 90 tablet 1   No facility-administered medications prior to visit.    Allergies  Allergen Reactions  . Other Itching    Steri strips at time of 12/2016 surg  . Tape Itching    Steri strips at time of 12/2016 surg  . Codeine Nausea And Vomiting and Other (See Comments)    GI Upset  . Hydrocodone Nausea And Vomiting    ROS Review of Systems    Objective:    Physical Exam Constitutional:      Appearance: Jasmine Fitzgerald is well-developed.  HENT:     Head: Normocephalic and atraumatic.  Cardiovascular:     Rate and Rhythm: Normal rate and regular rhythm.     Heart sounds: Normal heart sounds.  Pulmonary:     Effort: Pulmonary effort is  normal.     Breath sounds: Normal breath sounds.  Skin:    General: Skin is warm and dry.  Neurological:     Mental Status: Jasmine Fitzgerald is alert and oriented to person, place, and  time.  Psychiatric:        Behavior: Behavior normal.     BP (!) 116/51   Pulse 64   Ht '5\' 2"'  (1.575 m)   Wt 149 lb (67.6 kg)   SpO2 99%   BMI 27.25 kg/m  Wt Readings from Last 3 Encounters:  07/09/20 149 lb (67.6 kg)  01/07/20 162 lb (73.5 kg)  09/09/19 166 lb (75.3 kg)     There are no preventive care reminders to display for this patient.  There are no preventive care reminders to display for this patient.  Lab Results  Component Value Date   TSH 1.180 05/07/2019   Lab Results  Component Value Date   WBC 5.7 05/07/2019   HGB 13.1 05/07/2019   HCT 38.8 05/07/2019   MCV 93 05/07/2019   PLT 209 05/07/2019   Lab Results  Component Value Date   NA 142 01/07/2020   K 5.1 01/07/2020   CO2 26 01/07/2020   GLUCOSE 92 01/07/2020   BUN 17 01/07/2020   CREATININE 0.72 01/07/2020   BILITOT 0.9 05/07/2019   ALKPHOS 37 (L) 05/07/2019   AST 15 05/07/2019   ALT 9 05/07/2019   PROT 6.4 05/07/2019   ALBUMIN 4.2 05/07/2019   CALCIUM 9.4 01/07/2020   Lab Results  Component Value Date   CHOL 191 05/07/2019   Lab Results  Component Value Date   HDL 80 05/07/2019   Lab Results  Component Value Date   LDLCALC 100 (H) 05/07/2019   Lab Results  Component Value Date   TRIG 57 05/07/2019   Lab Results  Component Value Date   CHOLHDL 2.3 03/14/2018   Lab Results  Component Value Date   HGBA1C 5.7 (A) 07/09/2020      Assessment & Plan:   Problem List Items Addressed This Visit      Cardiovascular and Mediastinum   Essential hypertension, benign    Having some low BPS on 37m of amlodpine and 12.5 of HCTZ and losartan 576m  Discussed discontinuing the amlodipine.  Just hold it completely for now and monitor blood pressures over the next 2 weeks where shooting for systolics in the 12751Zn  average.  Occasional lows okay and occasional in the 130s is okay but we want most of the blood pressures in the 120 range.  Continue with regular exercise and healthy diet Jasmine Fitzgerald is really made some fantastic changes over the last 6 months.      Relevant Orders   Lipid panel   CMP14+EGFR     Endocrine   Controlled type 2 diabetes mellitus without complication, without long-term current use of insulin ([facility]    He is doing phenomenally well.  A1c is now down to 5.7 after his dietary changes.  Continue with healthy diet and regular exercise.  Follow-up in 4 months.      Relevant Orders   POCT glycosylated hemoglobin (Hb A1C) (Completed)   Lipid panel   CMP14+EGFR     Other   Disturbance in sleep behavior    Is like her sleep is well controlled with zolpidem 5 mg.  We will continue current regimen.       Other Visit Diagnoses    Need for immunization against influenza       Relevant Orders   Flu Vaccine QUAD High Dose(Fluad) (Completed)      No orders of the defined types were placed in this encounter.   Follow-up: Return in about 4 months (around 11/08/2020)  for Diabetes follow-up.    Beatrice Lecher, MD

## 2020-07-10 ENCOUNTER — Other Ambulatory Visit: Payer: Self-pay | Admitting: Family Medicine

## 2020-07-10 DIAGNOSIS — I10 Essential (primary) hypertension: Secondary | ICD-10-CM | POA: Diagnosis not present

## 2020-07-10 DIAGNOSIS — E119 Type 2 diabetes mellitus without complications: Secondary | ICD-10-CM | POA: Diagnosis not present

## 2020-07-11 LAB — LIPID PANEL
Chol/HDL Ratio: 2.2 ratio (ref 0.0–4.4)
Cholesterol, Total: 171 mg/dL (ref 100–199)
HDL: 77 mg/dL (ref >39)
LDL Chol Calc (NIH): 83 mg/dL (ref 0–99)
Triglycerides: 56 mg/dL (ref 0–149)
VLDL Cholesterol Cal: 11 mg/dL (ref 5–40)

## 2020-07-11 LAB — CMP14+EGFR
ALT: 8 IU/L (ref 0–32)
AST: 12 IU/L (ref 0–40)
Albumin/Globulin Ratio: 2.2 (ref 1.2–2.2)
Albumin: 4.3 g/dL (ref 3.7–4.7)
Alkaline Phosphatase: 37 IU/L — ABNORMAL LOW (ref 48–121)
BUN/Creatinine Ratio: 23 (ref 12–28)
BUN: 16 mg/dL (ref 8–27)
Bilirubin Total: 1 mg/dL (ref 0.0–1.2)
CO2: 25 mmol/L (ref 20–29)
Calcium: 9.2 mg/dL (ref 8.7–10.3)
Chloride: 102 mmol/L (ref 96–106)
Creatinine, Ser: 0.71 mg/dL (ref 0.57–1.00)
GFR calc Af Amer: 98 mL/min/{1.73_m2} (ref >59)
GFR calc non Af Amer: 85 mL/min/{1.73_m2} (ref >59)
Globulin, Total: 2 g/dL (ref 1.5–4.5)
Glucose: 101 mg/dL — ABNORMAL HIGH (ref 65–99)
Potassium: 4.1 mmol/L (ref 3.5–5.2)
Sodium: 141 mmol/L (ref 134–144)
Total Protein: 6.3 g/dL (ref 6.0–8.5)

## 2020-08-03 ENCOUNTER — Other Ambulatory Visit: Payer: Self-pay

## 2020-08-03 ENCOUNTER — Encounter: Payer: Self-pay | Admitting: Nurse Practitioner

## 2020-08-03 ENCOUNTER — Ambulatory Visit (INDEPENDENT_AMBULATORY_CARE_PROVIDER_SITE_OTHER): Payer: Medicare Other | Admitting: Nurse Practitioner

## 2020-08-03 VITALS — BP 129/70 | HR 76 | Ht 62.0 in | Wt 150.0 lb

## 2020-08-03 DIAGNOSIS — N200 Calculus of kidney: Secondary | ICD-10-CM | POA: Diagnosis not present

## 2020-08-03 DIAGNOSIS — R102 Pelvic and perineal pain: Secondary | ICD-10-CM | POA: Diagnosis not present

## 2020-08-03 DIAGNOSIS — R3129 Other microscopic hematuria: Secondary | ICD-10-CM

## 2020-08-03 DIAGNOSIS — I10 Essential (primary) hypertension: Secondary | ICD-10-CM | POA: Diagnosis not present

## 2020-08-03 LAB — POCT URINALYSIS DIP (CLINITEK)

## 2020-08-03 MED ORDER — TAMSULOSIN HCL 0.4 MG PO CAPS: 0.4000 mg | ORAL_CAPSULE | Freq: Every day | ORAL | 3 refills | Status: DC

## 2020-08-03 NOTE — Patient Instructions (Addendum)
Lets plan to stop the Hydrochlorothiazide 12.5mg  tablet for the next 2 weeks and monitor your blood pressures at home. We will be shooting for 120's on average on the blood pressure. It is OK to have some out of that range, but we want most to be in the 120's.  Please send me a message if you are having severe pain or if your symptoms are not improved at all by Thursday. I will let you know if the culture shows any bacteria. If so, I will send in an antibiotic.    Kidney Stones  Kidney stones are solid, rock-like deposits that form inside of the kidneys. The kidneys are a pair of organs that make urine. A kidney stone may form in a kidney and move into other parts of the urinary tract, including the tubes that connect the kidneys to the bladder (ureters), the bladder, and the tube that carries urine out of the body (urethra). As the stone moves through these areas, it can cause intense pain and block the flow of urine. Kidney stones are created when high levels of certain minerals are found in the urine. The stones are usually passed out of the body through urination, but in some cases, medical treatment may be needed to remove them. What are the causes? Kidney stones may be caused by:  A condition in which certain glands produce too much parathyroid hormone (primary hyperparathyroidism), which causes too much calcium buildup in the blood.  A buildup of uric acid crystals in the bladder (hyperuricosuria). Uric acid is a chemical that the body produces when you eat certain foods. It usually exits the body in the urine.  Narrowing (stricture) of one or both of the ureters.  A kidney blockage that is present at birth (congenital obstruction).  Past surgery on the kidney or the ureters, such as gastric bypass surgery. What increases the risk? The following factors may make you more likely to develop this condition:  Having had a kidney stone in the past.  Having a family history of kidney  stones.  Not drinking enough water.  Eating a diet that is high in protein, salt (sodium), or sugar.  Being overweight or obese. What are the signs or symptoms? Symptoms of a kidney stone may include:  Pain in the side of the abdomen, right below the ribs (flank pain). Pain usually spreads (radiates) to the groin.  Needing to urinate frequently or urgently.  Painful urination.  Blood in the urine (hematuria).  Nausea.  Vomiting.  Fever and chills. How is this diagnosed? This condition may be diagnosed based on:  Your symptoms and medical history.  A physical exam.  Blood tests.  Urine tests. These may be done before and after the stone passes out of your body through urination.  Imaging tests, such as a CT scan, abdominal X-ray, or ultrasound.  A procedure to examine the inside of the bladder (cystoscopy). How is this treated? Treatment for kidney stones depends on the size, location, and makeup of the stones. Kidney stones will often pass out of the body through urination. You may need to:  Increase your fluid intake to help pass the stone. In some cases, you may be given fluids through an IV and may need to be monitored at the hospital.  Take medicine for pain.  Make changes in your diet to help prevent kidney stones from coming back. Sometimes, medical procedures are needed to remove a kidney stone. This may involve:  A procedure to break  up kidney stones using: ? A focused beam of light (laser therapy). ? Shock waves (extracorporeal shock wave lithotripsy).  Surgery to remove kidney stones. This may be needed if you have severe pain or have stones that block your urinary tract. Follow these instructions at home: Medicines  Take over-the-counter and prescription medicines only as told by your health care provider.  Ask your health care provider if the medicine prescribed to you requires you to avoid driving or using heavy machinery. Eating and  drinking  Drink enough fluid to keep your urine pale yellow. You may be instructed to drink at least 8-10 glasses of water each day. This will help you pass the kidney stone.  If directed, change your diet. This may include: ? Limiting how much sodium you eat. ? Eating more fruits and vegetables. ? Limiting how much animal protein--such as red meat, poultry, fish, and eggs--you eat.  Follow instructions from your health care provider about eating or drinking restrictions. General instructions  Collect urine samples as told by your health care provider. You may need to collect a urine sample: ? 24 hours after you pass the stone. ? 8-12 weeks after passing the kidney stone, and every 6-12 months after that.  Strain your urine every time you urinate, for as long as directed. Use the strainer that your health care provider recommends.  Do not throw out the kidney stone after passing it. Keep the stone so it can be tested by your health care provider. Testing the makeup of your kidney stone may help prevent you from getting kidney stones in the future.  Keep all follow-up visits as told by your health care provider. This is important. You may need follow-up X-rays or ultrasounds to make sure that your stone has passed. How is this prevented? To prevent another kidney stone:  Drink enough fluid to keep your urine pale yellow. This is the best way to prevent kidney stones.  Eat a healthy diet and follow recommendations from your health care provider about foods to avoid. You may be instructed to eat a low-protein diet. Recommendations vary depending on the type of kidney stone that you have.  Maintain a healthy weight. Where to find more information  [place] [facility]): www.kidney.[facility]): www.urologyhealth.org Contact a health care provider if:  You have pain that gets worse or does not get better with medicine. Get help right away if:  You  have a fever or chills.  You develop severe pain.  You develop new abdominal pain.  You faint.  You are unable to urinate. Summary  Kidney stones are solid, rock-like deposits that form inside of the kidneys.  Kidney stones can cause nausea, vomiting, blood in the urine, abdominal pain, and the urge to urinate frequently.  Treatment for kidney stones depends on the size, location, and makeup of the stones. Kidney stones will often pass out of the body through urination.  Kidney stones can be prevented by drinking enough fluids, eating a healthy diet, and maintaining a healthy weight. This information is not intended to replace advice given to you by your health care provider. Make sure you discuss any questions you have with your health care provider. Document Revised: 03/05/2019 Document Reviewed: 03/05/2019 Elsevier Patient Education  [place].

## 2020-08-03 NOTE — Assessment & Plan Note (Signed)
Pelvic pain and pressure in the setting of large hematuria on urinalysis with no other signs of infection. Suspect the presence of kidney stone given the patient's symptoms and presentation. We will send urine for culture to ensure no bacteria is present

## 2020-08-03 NOTE — Assessment & Plan Note (Signed)
Hematuria noted on urinalysis today with no signs of infection present at this time.   Based on her symptoms and presentation I suspect probable kidney stone is to blame for her current symptoms. She does have a longstanding history with Dr. Waynette Buttery with [facility] in [place] for previous kidney stones. We will send urine for culture just to be sure that no infection is present.

## 2020-08-03 NOTE — Assessment & Plan Note (Signed)
Symptoms and presentation consistent with kidney stone. Will start tamsulosin daily and recommend increase fluid intake and Tylenol for any pain at this time. Discussed with the patient if her pain becomes severe or is not controlled with Tylenol to let me know and we will send in a stronger medication.  At this time she feels the Tylenol is sufficient. She does have a history of kidney stones that required surgical intervention, therefore recommend that she strain her urine and increase her liquid intake for the next several days.  If her symptoms persist to Thursday of this week or if pain significantly worsens she will likely need urology referral. She does have a history with Dr. Waynette Buttery with [facility] in [place] and she would like to be seen by her if at all possible. We will also send her urine today for culture to rule out the presence of bacteria, although none showed on the point-of-care urinalysis. Recommend patient follow-up if symptoms worsen or fail to improve.

## 2020-08-03 NOTE — Progress Notes (Signed)
Patient reports waking up at 5am on Thursday morning with pelvic pain.  She took AZO Thusday and Friday which helped her symptoms.

## 2020-08-03 NOTE — Assessment & Plan Note (Signed)
Continues to have some low blood pressure readings on 12.5 mg of HCTZ and 50 mg losartan. Discussed the option of holding the 12.5 mg of HCTZ and monitor blood pressures over the next 2 weeks to see if we can hit the goal range of 120s on average. Discussed with her it is okay to have occasional lows and occasional 130s but the majority of the blood pressures should be in the 120 range. She has not taken her blood pressure medication today and her current blood pressure reading is 129/70. We are starting tamsulosin today for suspected kidney stone therefore I do suspect this will cause a slight decrease in her blood pressure while she is taking this. Continue with regular exercise and healthy diet. Notify us if your blood pressure readings are consistently in the low 100s or 90s.

## 2020-08-03 NOTE — Progress Notes (Signed)
Acute Office Visit  Subjective:    Patient ID: Jasmine Fitzgerald, female    DOB: 07-12-1946, 75 y.o.   MRN: [id number]  Chief Complaint  Patient presents with  . pelvic pressure in female    HPI Patient is in today for pelvic pressure, urinary, burning, and frequency that started last Thursday Jasmine Fitzgerald morning. She reports that she took OTC AZO on Thursday and Friday of last week, but reports it only seemed to help a little. She tells me that she did have some right sided low back pain over the weekend, but attributed this to tripping over an object- that has since resolved. She tells me that yesterday the pain was located in the right suprapubic region and felt similar to menstrual cramps. She states that the abdomen feels tender in that area when she presses gently.   She denies blood, dark urine, or odor. She denies decreased urinary output, or the need to strain to urinate.   She has also been monitoring her blood pressure for her PCP over the past two weeks. At her last visit, they stopped her amlodipine due to low blood pressures. He has been monitoring her blood pressures daily since that time. Her recorded blood pressures range from 97/58 to the low 130's/80. Her blood pressure primarily are in the low 409'W to 119'J systolic, despite stopping the amlodipine.   Her goal blood pressure is an average of 478 systolic. She reports that she is still experiencing some dizzy spells with her current blood pressure regimen.   Past Medical History:  Diagnosis Date  . Carpal tunnel syndrome   . Colon polyp   . Kidney stones   . Renal cyst    benign    Past Surgical History:  Procedure Laterality Date  . ABDOMINAL SURGERY  12/16/2016   Pancreaticoduodenal aortic aneurysm repair  . BREAST REDUCTION SURGERY  2013  . HERNIA REPAIR  1996  . KIDNEY STONE SURGERY  2015  . KNEE ARTHROSCOPY    . TONSILLECTOMY  1957  . TRIGGER FINGER RELEASE  07/2015  . UPPER GASTROINTESTINAL ENDOSCOPY      Family  History  Problem Relation Age of Onset  . Heart failure Mother   . Heart failure Father   . Heart attack Father   . Diabetes Brother   . Cancer Maternal Aunt   . Cancer Maternal Uncle   . Ovarian cancer Unknown   . Lung cancer Unknown   . Throat cancer Unknown   . Stroke Maternal Grandmother     Social History   Socioeconomic History  . Marital status: Married    Spouse name: Patrick Jupiter  . Number of children: 3  . Years of education: 65  . Highest education level: Not on file  Occupational History  . Occupation: Retired  Tobacco Use  . Smoking status: Never Smoker  . Smokeless tobacco: Never Used  Substance and Sexual Activity  . Alcohol use: No  . Drug use: No  . Sexual activity: Not Currently  Other Topics Concern  . Not on file  Social History Narrative   Retired. High school degree. Married to [place]. 3 adult children. Currently lives with her spouse.   Social Determinants of Health   Financial Resource Strain:   . Difficulty of Paying Living Expenses: Not on file  Food Insecurity:   . Worried About Charity fundraiser in the Last Year: Not on file  . Ran Out of Food in the Last Year: Not on file  Transportation Needs:   . Film/video editor (Medical): Not on file  . Lack of Transportation (Non-Medical): Not on file  Physical Activity:   . Days of Exercise per Week: Not on file  . Minutes of Exercise per Session: Not on file  Stress:   . Feeling of Stress : Not on file  Social Connections:   . Frequency of Communication with Friends and Family: Not on file  . Frequency of Social Gatherings with Friends and Family: Not on file  . Attends Religious Services: Not on file  . Active Member of Clubs or Organizations: Not on file  . Attends Archivist Meetings: Not on file  . Marital Status: Not on file  Intimate Partner Violence:   . Fear of Current or Ex-Partner: Not on file  . Emotionally Abused: Not on file  . Physically Abused: Not on file  .  Sexually Abused: Not on file    Outpatient Medications Prior to Visit  Medication Sig Dispense Refill  . ALPRAZolam (XANAX) 0.25 MG tablet Take 1 tablet (0.25 mg total) by mouth at bedtime as needed for sleep. 30 tablet 1  . AMBULATORY NON FORMULARY MEDICATION Medication Name: Test strips.  Check glucose 2 times per week. #100.  Dx: Diabetes 1 vial 0  . amLODipine (NORVASC) 5 MG tablet Take 1 tablet (5 mg total) by mouth daily. 90 tablet 1  . aspirin 81 MG tablet Take by mouth.    . blood glucose meter kit and supplies KIT Dispense based on patient and insurance preference. Use up to once daily as directed. (FOR ICD-9 250.00, 250.01). 1 each PRN  . celecoxib (CELEBREX) 200 MG capsule Take 1 capsule (200 mg total) by mouth 2 (two) times daily. 60 capsule 6  . Cholecalciferol (VITAMIN D3) 25 MCG (1000 UT) CAPS Take 2 capsules by mouth daily.    . clobetasol (TEMOVATE) 0.05 % external solution Apply to scalp once or twice daily when needed for itching.  5  . losartan (COZAAR) 50 MG tablet Take 1 tablet (50 mg total) by mouth daily. 90 tablet 1  . omeprazole (PRILOSEC) 20 MG capsule Take 1 capsule (20 mg total) by mouth daily. 90 capsule 1  . RESTASIS 0.05 % ophthalmic emulsion 1 drop 2 (two) times daily.    Marland Kitchen triamcinolone cream (KENALOG) 0.1 % Apply to irritated & itchy areas of the body once or twice daily when needed. May mix in equal parts with moisturizer. Not to face.  3  . TRUE METRIX BLOOD GLUCOSE TEST test strip USE UP TO ONCE DAILY AS DIRECTED 50 each 3  . Turmeric (CURCUMIN 95 PO) Take 3 tablets by mouth daily.    Marland Kitchen zinc gluconate 50 MG tablet Take 50 mg by mouth daily.    Marland Kitchen zolpidem (AMBIEN) 5 MG tablet Take 1 tablet (5 mg total) by mouth at bedtime as needed for sleep. 30 tablet 5  . hydrochlorothiazide (HYDRODIURIL) 12.5 MG tablet Take 1 tablet (12.5 mg total) by mouth daily. 90 tablet 1   No facility-administered medications prior to visit.    Allergies  Allergen Reactions  .  Other Itching    Steri strips at time of 12/2016 surg  . Tape Itching    Steri strips at time of 12/2016 surg  . Codeine Nausea And Vomiting and Other (See Comments)    GI Upset  . Hydrocodone Nausea And Vomiting    Review of Systems See HPI    Objective:    Physical  Exam Vitals and nursing note reviewed.  Constitutional:      Appearance: Normal appearance. She is normal weight.  Eyes:     Extraocular Movements: Extraocular movements intact.     Conjunctiva/sclera: Conjunctivae normal.     Pupils: Pupils are equal, round, and reactive to light.  Cardiovascular:     Rate and Rhythm: Normal rate and regular rhythm.     Pulses: Normal pulses.     Heart sounds: Normal heart sounds.  Pulmonary:     Effort: Pulmonary effort is normal.     Breath sounds: Normal breath sounds.  Abdominal:     General: Abdomen is flat. There is no distension.     Palpations: Abdomen is soft.     Tenderness: There is abdominal tenderness. There is no right CVA tenderness, left CVA tenderness, guarding or rebound.    Musculoskeletal:        General: Normal range of motion.     Cervical back: Normal range of motion.  Skin:    General: Skin is warm and dry.     Capillary Refill: Capillary refill takes less than 2 seconds.  Neurological:     General: No focal deficit present.     Mental Status: She is alert and oriented to person, place, and time.  Psychiatric:        Mood and Affect: Mood normal.        Behavior: Behavior normal.        Thought Content: Thought content normal.        Judgment: Judgment normal.     BP 129/70   Pulse 76   Ht '5\' 2"'  (1.575 m)   Wt 150 lb (68 kg)   SpO2 97%   BMI 27.44 kg/m  Wt Readings from Last 3 Encounters:  08/03/20 150 lb (68 kg)  07/09/20 149 lb (67.6 kg)  01/07/20 162 lb (73.5 kg)    There are no preventive care reminders to display for this patient.  There are no preventive care reminders to display for this patient.   Lab Results  Component  Value Date   TSH 1.180 05/07/2019   Lab Results  Component Value Date   WBC 5.7 05/07/2019   HGB 13.1 05/07/2019   HCT 38.8 05/07/2019   MCV 93 05/07/2019   PLT 209 05/07/2019   Lab Results  Component Value Date   NA 141 07/10/2020   K 4.1 07/10/2020   CO2 25 07/10/2020   GLUCOSE 101 (H) 07/10/2020   BUN 16 07/10/2020   CREATININE 0.71 07/10/2020   BILITOT 1.0 07/10/2020   ALKPHOS 37 (L) 07/10/2020   AST 12 07/10/2020   ALT 8 07/10/2020   PROT 6.3 07/10/2020   ALBUMIN 4.3 07/10/2020   CALCIUM 9.2 07/10/2020   Lab Results  Component Value Date   CHOL 171 07/10/2020   Lab Results  Component Value Date   HDL 77 07/10/2020   Lab Results  Component Value Date   LDLCALC 83 07/10/2020   Lab Results  Component Value Date   TRIG 56 07/10/2020   Lab Results  Component Value Date   CHOLHDL 2.2 07/10/2020   Lab Results  Component Value Date   HGBA1C 5.7 (A) 07/09/2020       Assessment & Plan:   Problem List Items Addressed This Visit      Cardiovascular and Mediastinum   Essential hypertension, benign    Continues to have some low blood pressure readings on 12.5 mg of HCTZ and 50  mg losartan. Discussed the option of holding the 12.5 mg of HCTZ and monitor blood pressures over the next 2 weeks to see if we can hit the goal range of 120s on average. Discussed with her it is okay to have occasional lows and occasional 130s but the majority of the blood pressures should be in the 120 range. She has not taken her blood pressure medication today and her current blood pressure reading is 129/70. We are starting tamsulosin today for suspected kidney stone therefore I do suspect this will cause a slight decrease in her blood pressure while she is taking this. Continue with regular exercise and healthy diet. Notify us if your blood pressure readings are consistently in the low 100s or 90s.        Genitourinary   Kidney stones - Primary    Symptoms and presentation  consistent with kidney stone. Will start tamsulosin daily and recommend increase fluid intake and Tylenol for any pain at this time. Discussed with the patient if her pain becomes severe or is not controlled with Tylenol to let me know and we will send in a stronger medication.  At this time she feels the Tylenol is sufficient. She does have a history of kidney stones that required surgical intervention, therefore recommend that she strain her urine and increase her liquid intake for the next several days.  If her symptoms persist to Thursday of this week or if pain significantly worsens she will likely need urology referral. She does have a history with Dr. Waynette Buttery with [facility] in [place] and she would like to be seen by her if at all possible. We will also send her urine today for culture to rule out the presence of bacteria, although none showed on the point-of-care urinalysis. Recommend patient follow-up if symptoms worsen or fail to improve.      Relevant Medications   tamsulosin (FLOMAX) 0.4 MG CAPS capsule   Other microscopic hematuria    Hematuria noted on urinalysis today with no signs of infection present at this time.   Based on her symptoms and presentation I suspect probable kidney stone is to blame for her current symptoms. She does have a longstanding history with Dr. Waynette Buttery with [facility] in [place] for previous kidney stones. We will send urine for culture just to be sure that no infection is present.      Relevant Orders   Urine Culture     Other   Pelvic pressure in female    Pelvic pain and pressure in the setting of large hematuria on urinalysis with no other signs of infection. Suspect the presence of kidney stone given the patient's symptoms and presentation. We will send urine for culture to ensure no bacteria is present      Relevant Orders   POCT URINALYSIS DIP (CLINITEK) (Completed)         Meds ordered this encounter  Medications  . tamsulosin (FLOMAX) 0.4 MG CAPS capsule    Sig: Take 1 capsule (0.4 mg total) by mouth daily.    Dispense:  30 capsule    Refill:  3   Follow-up if symptoms worsen or fail to improve.  Please send Dr. Madilyn Fireman your blood pressure readings over the next 2 weeks so she can evaluate the change by stopping the hydrochlorothiazide.   Orma Render, NP

## 2020-08-05 LAB — URINE CULTURE
MICRO NUMBER:: 11031035
Result:: NO GROWTH
SPECIMEN QUALITY:: ADEQUATE

## 2020-08-05 NOTE — Progress Notes (Signed)
Hi Jasmine Fitzgerald,   Your urine culture did not come back showing any growth, so no signs of infection.   Are you still having the pain? Have you passed any stones yet?

## 2020-08-13 ENCOUNTER — Other Ambulatory Visit: Payer: Self-pay | Admitting: Family Medicine

## 2020-08-14 ENCOUNTER — Other Ambulatory Visit: Payer: Self-pay | Admitting: Family Medicine

## 2020-08-14 MED ORDER — ALPRAZOLAM 0.25 MG PO TABS
0.2500 mg | ORAL_TABLET | Freq: Every evening | ORAL | 1 refills | Status: DC | PRN
Start: 2020-08-14 — End: 2020-08-17

## 2020-08-17 ENCOUNTER — Other Ambulatory Visit: Payer: Self-pay | Admitting: Family Medicine

## 2020-08-17 MED ORDER — ALPRAZOLAM 0.25 MG PO TABS
0.2500 mg | ORAL_TABLET | Freq: Every evening | ORAL | 1 refills | Status: DC | PRN
Start: 2020-08-17 — End: 2021-03-16

## 2020-09-28 DIAGNOSIS — Z23 Encounter for immunization: Secondary | ICD-10-CM | POA: Diagnosis not present

## 2020-09-29 ENCOUNTER — Ambulatory Visit (INDEPENDENT_AMBULATORY_CARE_PROVIDER_SITE_OTHER): Payer: Medicare Other | Admitting: Family Medicine

## 2020-09-29 ENCOUNTER — Other Ambulatory Visit: Payer: Self-pay

## 2020-09-29 ENCOUNTER — Encounter: Payer: Self-pay | Admitting: Family Medicine

## 2020-09-29 VITALS — BP 139/59 | HR 77 | Ht 62.0 in | Wt 148.0 lb

## 2020-09-29 DIAGNOSIS — F4329 Adjustment disorder with other symptoms: Secondary | ICD-10-CM | POA: Diagnosis not present

## 2020-09-29 DIAGNOSIS — F418 Other specified anxiety disorders: Secondary | ICD-10-CM

## 2020-09-29 DIAGNOSIS — I10 Essential (primary) hypertension: Secondary | ICD-10-CM | POA: Diagnosis not present

## 2020-09-29 MED ORDER — CELECOXIB 200 MG PO CAPS: 200.0000 mg | ORAL_CAPSULE | Freq: Two times a day (BID) | ORAL | 6 refills | Status: DC

## 2020-09-29 MED ORDER — FLUOXETINE HCL 10 MG PO CAPS: ORAL_CAPSULE | ORAL | 0 refills | Status: DC

## 2020-09-29 NOTE — Progress Notes (Signed)
Acute Office Visit  Subjective:    Patient ID: Jasmine Fitzgerald, female    DOB: [birth year], 74 y.o.   MRN: [id number]  Chief Complaint  Patient presents with  . mood    HPI Patient is in today for mood change for several months. It has actually been building for quite some time she just has not really talked about it previously but feels like she just cannot go on feeling the way that she is feeling currently. She is just feeling very stressed and irritable. Now that she and her husband have retired she was hoping to be able to travel little spend a little bit more time with her husband. 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 She constantly stays busy and feels like she can never get everything done and then has a lot of guilty feelings around that. She is just noticed that she becomes very angry very easily she has a quick fuse. She also notes that she would be able to hold her tongue previously but now just says which she thinks no matter who it is. She gets upset when she feels like things were not completed or things are not well organized in her home particularly. She just feels like she is under a lot of stress. She does have a history of chronic insomnia and takes a sleep aid and says that it is just still a struggle to get a good night sleep. She says years ago she actually took Zoloft for a short period of time and felt very flat on it. She does take alprazolam as needed but has been trying to use that sparingly she usually takes a half of a tab because otherwise it makes her sleepy. She says she knows that her husband really is not going to change and says she has to figure out how to live with things the way they are.  She also brought in home blood pressure log we had held her amlodipine her blood pressures actually actually still been good in fact she still had a couple of blood pressures even on losartan 50 mg daily.  Past Medical History:   Diagnosis Date  . Carpal tunnel syndrome   . Colon polyp   . Kidney stones   . Renal cyst    benign    Past Surgical History:  Procedure Laterality Date  . ABDOMINAL SURGERY  12/16/2016   Pancreaticoduodenal aortic aneurysm repair  . BREAST REDUCTION SURGERY  2013  . HERNIA REPAIR  1996  . KIDNEY STONE SURGERY  2015  . KNEE ARTHROSCOPY    . TONSILLECTOMY  1957  . TRIGGER FINGER RELEASE  07/2015  . UPPER GASTROINTESTINAL ENDOSCOPY      Family History  Problem Relation Age of Onset  . Heart failure Mother   . Heart failure Father   . Heart attack Father   . Diabetes Brother   . Cancer Maternal Aunt   . Cancer Maternal Uncle   . Ovarian cancer Unknown   . Lung cancer Unknown   . Throat cancer Unknown   . Stroke Maternal Grandmother     Social History   Socioeconomic History  . Marital status: Married    Spouse name: Patrick Jupiter  . Number of children: 3  . Years of education: 74  . Highest education level: Not on file  Occupational History  . Occupation: Retired  Tobacco Use  . Smoking status: Never Smoker  .  Smokeless tobacco: Never Used  Substance and Sexual Activity  . Alcohol use: No  . Drug use: No  . Sexual activity: Not Currently  Other Topics Concern  . Not on file  Social History Narrative   Retired. High school degree. Married to [place]. 3 adult children. Currently lives with her spouse.   Social Determinants of Health   Financial Resource Strain:   . Difficulty of Paying Living Expenses: Not on file  Food Insecurity:   . Worried About Charity fundraiser in the Last Year: Not on file  . Ran Out of Food in the Last Year: Not on file  Transportation Needs:   . Lack of Transportation (Medical): Not on file  . Lack of Transportation (Non-Medical): Not on file  Physical Activity:   . Days of Exercise per Week: Not on file  . Minutes of Exercise per Session: Not on file  Stress:   . Feeling of Stress : Not on file  Social Connections:   . Frequency  of Communication with Friends and Family: Not on file  . Frequency of Social Gatherings with Friends and Family: Not on file  . Attends Religious Services: Not on file  . Active Member of Clubs or Organizations: Not on file  . Attends Archivist Meetings: Not on file  . Marital Status: Not on file  Intimate Partner Violence:   . Fear of Current or Ex-Partner: Not on file  . Emotionally Abused: Not on file  . Physically Abused: Not on file  . Sexually Abused: Not on file    Outpatient Medications Prior to Visit  Medication Sig Dispense Refill  . ALPRAZolam (XANAX) 0.25 MG tablet Take 1 tablet (0.25 mg total) by mouth at bedtime as needed for sleep. 30 tablet 1  . AMBULATORY NON FORMULARY MEDICATION Medication Name: Test strips.  Check glucose 2 times per week. #100.  Dx: Diabetes 1 vial 0  . aspirin 81 MG tablet Take by mouth.    . blood glucose meter kit and supplies KIT Dispense based on patient and insurance preference. Use up to once daily as directed. (FOR ICD-9 250.00, 250.01). 1 each PRN  . Cholecalciferol (VITAMIN D3) 25 MCG (1000 UT) CAPS Take 2 capsules by mouth daily.    . clobetasol (TEMOVATE) 0.05 % external solution Apply to scalp once or twice daily when needed for itching.  5  . losartan (COZAAR) 50 MG tablet Take 1 tablet (50 mg total) by mouth daily. 90 tablet 1  . RESTASIS 0.05 % ophthalmic emulsion 1 drop 2 (two) times daily.    . TRUE METRIX BLOOD GLUCOSE TEST test strip USE UP TO ONCE DAILY AS DIRECTED 50 each 3  . Turmeric (CURCUMIN 95 PO) Take 3 tablets by mouth daily.    Marland Kitchen zinc gluconate 50 MG tablet Take 50 mg by mouth daily.    Marland Kitchen zolpidem (AMBIEN) 5 MG tablet Take 1 tablet (5 mg total) by mouth at bedtime as needed for sleep. 30 tablet 5  . celecoxib (CELEBREX) 200 MG capsule Take 1 capsule (200 mg total) by mouth 2 (two) times daily. 60 capsule 6  . amLODipine (NORVASC) 5 MG tablet Take 1 tablet (5 mg total) by mouth daily. (Patient not taking:  Reported on 09/29/2020) 90 tablet 1  . omeprazole (PRILOSEC) 20 MG capsule Take 1 capsule (20 mg total) by mouth daily. 90 capsule 1  . tamsulosin (FLOMAX) 0.4 MG CAPS capsule Take 1 capsule (0.4 mg total) by mouth daily.  30 capsule 3  . triamcinolone cream (KENALOG) 0.1 % Apply to irritated & itchy areas of the body once or twice daily when needed. May mix in equal parts with moisturizer. Not to face.  3   No facility-administered medications prior to visit.    Allergies  Allergen Reactions  . Other Itching    Steri strips at time of 12/2016 surg  . Tape Itching    Steri strips at time of 12/2016 surg  . Codeine Nausea And Vomiting and Other (See Comments)    GI Upset  . Hydrocodone Nausea And Vomiting    Review of Systems     Objective:    Physical Exam Vitals reviewed.  Constitutional:      Appearance: She is well-developed.  HENT:     Head: Normocephalic and atraumatic.  Eyes:     Conjunctiva/sclera: Conjunctivae normal.  Cardiovascular:     Rate and Rhythm: Normal rate.  Pulmonary:     Effort: Pulmonary effort is normal.  Skin:    General: Skin is dry.     Coloration: Skin is not pale.  Neurological:     Mental Status: She is alert and oriented to person, place, and time.  Psychiatric:        Behavior: Behavior normal.     BP (!) 139/59   Pulse 77   Ht '5\' 2"'  (1.575 m)   Wt 148 lb (67.1 kg)   SpO2 100%   BMI 27.07 kg/m  Wt Readings from Last 3 Encounters:  09/29/20 148 lb (67.1 kg)  08/03/20 150 lb (68 kg)  07/09/20 149 lb (67.6 kg)    Health Maintenance Due  Topic Date Due  . FOOT EXAM  09/08/2020    There are no preventive care reminders to display for this patient.   Lab Results  Component Value Date   TSH 1.180 05/07/2019   Lab Results  Component Value Date   WBC 5.7 05/07/2019   HGB 13.1 05/07/2019   HCT 38.8 05/07/2019   MCV 93 05/07/2019   PLT 209 05/07/2019   Lab Results  Component Value Date   NA 141 07/10/2020   K 4.1  07/10/2020   CO2 25 07/10/2020   GLUCOSE 101 (H) 07/10/2020   BUN 16 07/10/2020   CREATININE 0.71 07/10/2020   BILITOT 1.0 07/10/2020   ALKPHOS 37 (L) 07/10/2020   AST 12 07/10/2020   ALT 8 07/10/2020   PROT 6.3 07/10/2020   ALBUMIN 4.3 07/10/2020   CALCIUM 9.2 07/10/2020   Lab Results  Component Value Date   CHOL 171 07/10/2020   Lab Results  Component Value Date   HDL 77 07/10/2020   Lab Results  Component Value Date   LDLCALC 83 07/10/2020   Lab Results  Component Value Date   TRIG 56 07/10/2020   Lab Results  Component Value Date   CHOLHDL 2.2 07/10/2020   Lab Results  Component Value Date   HGBA1C 5.7 (A) 07/09/2020       Assessment & Plan:   Problem List Items Addressed This Visit      Cardiovascular and Mediastinum   Essential hypertension, benign    Discussed options. Doing well off of the amlodipine blood pressures at home look phenomenal. In fact I am going to have her try to cut her losartan in half and take 25 mg daily for the next couple weeks        Other   Depression with anxiety    Discussed options. We  will start with fluoxetine discussed how I want the medication to work for her. We will follow up in 3 to 4 weeks to make sure she is doing well and make adjustments if needed she is worried about feeling too flat so we will definitely monitor carefully for that. Also discussed how her mood can worsen her sleep quality especially since she already has chronic insomnia. Also discussed referral for therapy/counseling I think it could really be helpful for her.  PHQ 9 score of 18 and GAD-7 score of 13. She does have thoughts of not wanting to be here but no active thoughts of harming herself. He rates her symptoms as extremely difficult.      Relevant Medications   FLUoxetine (PROZAC) 10 MG capsule   Other Relevant Orders   Ambulatory referral to [place]    Other Visit Diagnoses    Stress and adjustment reaction    -  Primary    Relevant Orders   Ambulatory referral to [facility] ordered this encounter  Medications  . FLUoxetine (PROZAC) 10 MG capsule    Sig: Take 1 capsule (10 mg total) by mouth daily for 6 days, THEN 2 capsules (20 mg total) daily for 24 days.    Dispense:  54 capsule    Refill:  0  . celecoxib (CELEBREX) 200 MG capsule    Sig: Take 1 capsule (200 mg total) by mouth 2 (two) times daily.    Dispense:  60 capsule    Refill:  6   I spent 30 minutes on the day of the encounter to include pre-visit record review, face-to-face time with the patient and post visit ordering of test.   Beatrice Lecher, MD

## 2020-09-29 NOTE — Assessment & Plan Note (Addendum)
Discussed options. We will start with fluoxetine discussed how I want the medication to work for her. We will follow up in 3 to 4 weeks to make sure she is doing well and make adjustments if needed she is worried about feeling too flat so we will definitely monitor carefully for that. Also discussed how her mood can worsen her sleep quality especially since she already has chronic insomnia. Also discussed referral for therapy/counseling I think it could really be helpful for her.  PHQ 9 score of 18 and GAD-7 score of 13. She does have thoughts of not wanting to be here but no active thoughts of harming herself. He rates her symptoms as extremely difficult.

## 2020-09-29 NOTE — Assessment & Plan Note (Signed)
Discussed options. Doing well off of the amlodipine blood pressures at home look phenomenal. In fact I am going to have her try to cut her losartan in half and take 25 mg daily for the next couple weeks

## 2020-10-20 ENCOUNTER — Ambulatory Visit (INDEPENDENT_AMBULATORY_CARE_PROVIDER_SITE_OTHER): Payer: Medicare Other | Admitting: Family Medicine

## 2020-10-20 ENCOUNTER — Encounter: Payer: Self-pay | Admitting: Family Medicine

## 2020-10-20 ENCOUNTER — Other Ambulatory Visit: Payer: Self-pay

## 2020-10-20 VITALS — BP 136/56 | HR 61 | Ht 62.0 in | Wt 149.0 lb

## 2020-10-20 DIAGNOSIS — F4329 Adjustment disorder with other symptoms: Secondary | ICD-10-CM | POA: Diagnosis not present

## 2020-10-20 DIAGNOSIS — I10 Essential (primary) hypertension: Secondary | ICD-10-CM

## 2020-10-20 DIAGNOSIS — F418 Other specified anxiety disorders: Secondary | ICD-10-CM

## 2020-10-20 MED ORDER — FLUOXETINE HCL 20 MG PO CAPS
20.0000 mg | ORAL_CAPSULE | Freq: Every day | ORAL | 1 refills | Status: DC
Start: 2020-10-20 — End: 2020-12-10

## 2020-10-20 NOTE — Progress Notes (Signed)
Established Patient Office Visit  Subjective:  Patient ID: Jasmine Fitzgerald, female    DOB: 24-Feb-1946  Age: 74 y.o. MRN: [id number]  CC:  Chief Complaint  Patient presents with  . mood    HPI Jasmine Fitzgerald presents for f/u stress.  Started fluoxetine about 2.5 weeks ago.  She actually feels like it is been really helpful for her mood.  She is also made some decisions to really not give so much of herself that she is exhausted and to really set some limits.  She even had a long conversation with her husband.  The thing that she has noted is feels really tired in the afternoon, a few hours after she takes the medication.  She usually takes it around 10 AM..  No GI upset.  She has not noticed any other significant or negative side effects.  F/U HTN - only taking half a tab of the losartan. Already off the amlodipine  Started that on 11/30.  Not in home blood pressure log today.  Blood pressures really overall look fantastic mostly in the 120s.  She has a few in the 130s.  And a few less than 120.  Past Medical History:  Diagnosis Date  . Carpal tunnel syndrome   . Colon polyp   . Kidney stones   . Renal cyst    benign    Past Surgical History:  Procedure Laterality Date  . ABDOMINAL SURGERY  12/16/2016   Pancreaticoduodenal aortic aneurysm repair  . BREAST REDUCTION SURGERY  2013  . HERNIA REPAIR  1996  . KIDNEY STONE SURGERY  2015  . KNEE ARTHROSCOPY    . TONSILLECTOMY  1957  . TRIGGER FINGER RELEASE  07/2015  . UPPER GASTROINTESTINAL ENDOSCOPY      Family History  Problem Relation Age of Onset  . Heart failure Mother   . Heart failure Father   . Heart attack Father   . Diabetes Brother   . Cancer Maternal Aunt   . Cancer Maternal Uncle   . Ovarian cancer Unknown   . Lung cancer Unknown   . Throat cancer Unknown   . Stroke Maternal Grandmother     Social History   Socioeconomic History  . Marital status: Married    Spouse name: Patrick Jupiter  . Number of children: 3  .  Years of education: 58  . Highest education level: Not on file  Occupational History  . Occupation: Retired  Tobacco Use  . Smoking status: Never Smoker  . Smokeless tobacco: Never Used  Substance and Sexual Activity  . Alcohol use: No  . Drug use: No  . Sexual activity: Not Currently  Other Topics Concern  . Not on file  Social History Narrative   Retired. High school degree. Married to [place]. 3 adult children. Currently lives with her spouse.   Social Determinants of Health   Financial Resource Strain: Not on file  Food Insecurity: Not on file  Transportation Needs: Not on file  Physical Activity: Not on file  Stress: Not on file  Social Connections: Not on file  Intimate Partner Violence: Not on file    Outpatient Medications Prior to Visit  Medication Sig Dispense Refill  . ALPRAZolam (XANAX) 0.25 MG tablet Take 1 tablet (0.25 mg total) by mouth at bedtime as needed for sleep. 30 tablet 1  . AMBULATORY NON FORMULARY MEDICATION Medication Name: Test strips.  Check glucose 2 times per week. #100.  Dx: Diabetes 1 vial 0  . aspirin  81 MG tablet Take by mouth.    . blood glucose meter kit and supplies KIT Dispense based on patient and insurance preference. Use up to once daily as directed. (FOR ICD-9 250.00, 250.01). 1 each PRN  . celecoxib (CELEBREX) 200 MG capsule Take 1 capsule (200 mg total) by mouth 2 (two) times daily. 60 capsule 6  . Cholecalciferol (VITAMIN D3) 25 MCG (1000 UT) CAPS Take 2 capsules by mouth daily.    . clobetasol (TEMOVATE) 0.05 % external solution Apply to scalp once or twice daily when needed for itching.  5  . losartan (COZAAR) 50 MG tablet Take 1 tablet (50 mg total) by mouth daily. 90 tablet 1  . RESTASIS 0.05 % ophthalmic emulsion 1 drop 2 (two) times daily.    . TRUE METRIX BLOOD GLUCOSE TEST test strip USE UP TO ONCE DAILY AS DIRECTED 50 each 3  . Turmeric (CURCUMIN 95 PO) Take 3 tablets by mouth daily.    Marland Kitchen zinc gluconate 50 MG tablet Take 50  mg by mouth daily.    Marland Kitchen zolpidem (AMBIEN) 5 MG tablet Take 1 tablet (5 mg total) by mouth at bedtime as needed for sleep. 30 tablet 5  . FLUoxetine (PROZAC) 10 MG capsule Take 1 capsule (10 mg total) by mouth daily for 6 days, THEN 2 capsules (20 mg total) daily for 24 days. 54 capsule 0   No facility-administered medications prior to visit.    Allergies  Allergen Reactions  . Other Itching    Steri strips at time of 12/2016 surg  . Tape Itching    Steri strips at time of 12/2016 surg  . Codeine Nausea And Vomiting and Other (See Comments)    GI Upset  . Hydrocodone Nausea And Vomiting    ROS Review of Systems    Objective:    Physical Exam Constitutional:      Appearance: She is well-developed and well-nourished.  HENT:     Head: Normocephalic and atraumatic.  Cardiovascular:     Rate and Rhythm: Normal rate and regular rhythm.     Heart sounds: Normal heart sounds.  Pulmonary:     Effort: Pulmonary effort is normal.     Breath sounds: Normal breath sounds.  Skin:    General: Skin is warm and dry.  Neurological:     Mental Status: She is alert and oriented to person, place, and time.  Psychiatric:        Mood and Affect: Mood and affect normal.        Behavior: Behavior normal.     BP (!) 136/56   Pulse 61   Ht '5\' 2"'  (1.575 m)   Wt 149 lb (67.6 kg)   SpO2 100%   BMI 27.25 kg/m  Wt Readings from Last 3 Encounters:  10/20/20 149 lb (67.6 kg)  09/29/20 148 lb (67.1 kg)  08/03/20 150 lb (68 kg)     Health Maintenance Due  Topic Date Due  . COVID-19 Vaccine (3 - Moderna risk 4-dose series) 01/20/2020  . FOOT EXAM  09/08/2020    There are no preventive care reminders to display for this patient.  Lab Results  Component Value Date   TSH 1.180 05/07/2019   Lab Results  Component Value Date   WBC 5.7 05/07/2019   HGB 13.1 05/07/2019   HCT 38.8 05/07/2019   MCV 93 05/07/2019   PLT 209 05/07/2019   Lab Results  Component Value Date   NA 141  07/10/2020  K 4.1 07/10/2020   CO2 25 07/10/2020   GLUCOSE 101 (H) 07/10/2020   BUN 16 07/10/2020   CREATININE 0.71 07/10/2020   BILITOT 1.0 07/10/2020   ALKPHOS 37 (L) 07/10/2020   AST 12 07/10/2020   ALT 8 07/10/2020   PROT 6.3 07/10/2020   ALBUMIN 4.3 07/10/2020   CALCIUM 9.2 07/10/2020   Lab Results  Component Value Date   CHOL 171 07/10/2020   Lab Results  Component Value Date   HDL 77 07/10/2020   Lab Results  Component Value Date   LDLCALC 83 07/10/2020   Lab Results  Component Value Date   TRIG 56 07/10/2020   Lab Results  Component Value Date   CHOLHDL 2.2 07/10/2020   Lab Results  Component Value Date   HGBA1C 5.7 (A) 07/09/2020      Assessment & Plan:   Problem List Items Addressed This Visit      Cardiovascular and Mediastinum   Essential hypertension, benign    But home blood pressures look fantastic.  Is up a little bit here today so we will keep an eye on it but continue with her current regimen which seems to be working well.  She still probably has about a months worth of medication but once we refill it, I can decrease the losartan down to 25 mg daily.        Other   Depression with anxiety    So far doing really well with the fluoxetine it does sound like it might be causing a little to sedation several hours after she takes her dose.  We discussed maybe transitioning it to bedtime over a couple of days and taking advantage of the sedation and see if that works a little bit better for her.  Otherwise I am very thrilled with the results we have seen greater than 50% improvement in her PHQ-9 and GAD-7 scores today.  PHQ-9 score previously was 18 is down to 4.  Previous GAD-7 score was 13 and is now down to 2.  Plan to follow-up in about 4 to 6 weeks.      Relevant Medications   FLUoxetine (PROZAC) 20 MG capsule    Other Visit Diagnoses    Stress and adjustment reaction    -  Primary      Meds ordered this encounter  Medications  .  FLUoxetine (PROZAC) 20 MG capsule    Sig: Take 1 capsule (20 mg total) by mouth daily.    Dispense:  30 capsule    Refill:  1    Follow-up: Return in about 5 weeks (around 11/24/2020) for Medication and A1C.   I spent 30 minutes on the day of the encounter to include pre-visit record review, face-to-face time with the patient and post visit ordering of test.  Beatrice Lecher, MD

## 2020-10-20 NOTE — Assessment & Plan Note (Signed)
But home blood pressures look fantastic.  Is up a little bit here today so we will keep an eye on it but continue with her current regimen which seems to be working well.  She still probably has about a months worth of medication but once we refill it, I can decrease the losartan down to 25 mg daily.

## 2020-10-20 NOTE — Assessment & Plan Note (Signed)
So far doing really well with the fluoxetine it does sound like it might be causing a little to sedation several hours after she takes her dose.  We discussed maybe transitioning it to bedtime over a couple of days and taking advantage of the sedation and see if that works a little bit better for her.  Otherwise I am very thrilled with the results we have seen greater than 50% improvement in her PHQ-9 and GAD-7 scores today.  PHQ-9 score previously was 18 is down to 4.  Previous GAD-7 score was 13 and is now down to 2.  Plan to follow-up in about 4 to 6 weeks.

## 2020-11-10 ENCOUNTER — Ambulatory Visit: Payer: Medicare Other | Admitting: Family Medicine

## 2020-12-01 ENCOUNTER — Encounter: Payer: Self-pay | Admitting: Family Medicine

## 2020-12-01 ENCOUNTER — Ambulatory Visit (INDEPENDENT_AMBULATORY_CARE_PROVIDER_SITE_OTHER): Payer: Medicare Other | Admitting: Family Medicine

## 2020-12-01 ENCOUNTER — Other Ambulatory Visit: Payer: Self-pay

## 2020-12-01 VITALS — BP 123/51 | HR 65 | Ht 62.0 in | Wt 147.0 lb

## 2020-12-01 DIAGNOSIS — I10 Essential (primary) hypertension: Secondary | ICD-10-CM

## 2020-12-01 DIAGNOSIS — F418 Other specified anxiety disorders: Secondary | ICD-10-CM

## 2020-12-01 DIAGNOSIS — E119 Type 2 diabetes mellitus without complications: Secondary | ICD-10-CM

## 2020-12-01 DIAGNOSIS — I872 Venous insufficiency (chronic) (peripheral): Secondary | ICD-10-CM

## 2020-12-01 LAB — POCT GLYCOSYLATED HEMOGLOBIN (HGB A1C): Hemoglobin A1C: 5.5 % (ref 4.0–5.6)

## 2020-12-01 MED ORDER — LOSARTAN POTASSIUM 25 MG PO TABS: 25.0000 mg | ORAL_TABLET | Freq: Every day | ORAL | 1 refills | Status: DC

## 2020-12-01 NOTE — Assessment & Plan Note (Signed)
She did have a significant improvement in her symptom scores with the fluoxetine but I am concerned about the persistent sedation that she has been experiencing. Also the intermittent tremor which could certainly be a side effect of the medication we discussed options. 1 is to continue the medication as it is since it does seem to be helping and just continuing to monitor the symptoms as long as they are not worsening. We also discussed the possibility of adding something like Wellbutrin if needed. Or switching medications completely to find something similar that might be just as effective but without the sedation and tremor. She says for now she is okay with discontinuing the 20 mg fluoxetine for at least 1 more month to see if some of the symptoms seem to ease off and go away if they do not then when we regroup in about a month we can always try going back down to 10 mg and or possibly switching medication.

## 2020-12-01 NOTE — Assessment & Plan Note (Signed)
Hemoglobin A1c looks fantastic today. Is really done a great job. Follow-up in 6 months.   Lab Results  Component Value Date   HGBA1C 5.5 12/01/2020

## 2020-12-01 NOTE — Progress Notes (Signed)
Established Patient Office Visit  Subjective:  Patient ID: Jasmine Fitzgerald, female    DOB: 1946/05/24  Age: 75 y.o. MRN: [id number]  CC:  Chief Complaint  Patient presents with  . Diabetes  . Hypertension    She is on 1/2 tab of losartan  . Stress    She reports that she has had the "shakes" for several weeks    HPI Jasmine Fitzgerald presents for   Hypertension- Pt denies chest pain, SOB, dizziness, or heart palpitations.  Taking meds as directed w/o problems. Only taking half a tab the losartan.  Denies medication side effects.    Diabetes - no hypoglycemic events. No wounds or sores that are not healing well. No increased thirst or urination. Checking glucose at home. Taking medications as prescribed without any side effects.  Follow-up mood-she actually started fluoxetine about 3 and half to 4 weeks ago.  Was experiencing a little bit of sedation for a couple hours immediately after taking the medication. She says she still noticing that she feels a little sedated she is oftentimes taking 1 nap in the afternoon and sometimes 2. But she really does feel like it helps with her irritability. She is also noticed a little bit of tremor or shaking her hand that seems to come and go since starting the medication.  He says she is a little concerned because she has been having some chest pain. She has been sleeping on her back more and has been waking up feeling a tightness in her chest where she will feel like she cannot has to sit up to get some deep breaths and and do some deep breathing she said it almost feels like anxiety to her. But she also has a history of a hiatal hernia and wonders if that could be flaring as well.  Also noticed some darkening of the skin around her ankles. She has had problems with varicose veins for several years and in fact did see a vein specialist at one point in time. She wants to know if anything can be done about the discoloration  Past Medical History:   Diagnosis Date  . Carpal tunnel syndrome   . Colon polyp   . Kidney stones   . Renal cyst    benign    Past Surgical History:  Procedure Laterality Date  . ABDOMINAL SURGERY  12/16/2016   Pancreaticoduodenal aortic aneurysm repair  . BREAST REDUCTION SURGERY  2013  . HERNIA REPAIR  1996  . KIDNEY STONE SURGERY  2015  . KNEE ARTHROSCOPY    . TONSILLECTOMY  1957  . TRIGGER FINGER RELEASE  07/2015  . UPPER GASTROINTESTINAL ENDOSCOPY      Family History  Problem Relation Age of Onset  . Heart failure Mother   . Heart failure Father   . Heart attack Father   . Diabetes Brother   . Cancer Maternal Aunt   . Cancer Maternal Uncle   . Ovarian cancer Unknown   . Lung cancer Unknown   . Throat cancer Unknown   . Stroke Maternal Grandmother     Social History   Socioeconomic History  . Marital status: Married    Spouse name: Jasmine Fitzgerald  . Number of children: 3  . Years of education: 53  . Highest education level: Not on file  Occupational History  . Occupation: Retired  Tobacco Use  . Smoking status: Never Smoker  . Smokeless tobacco: Never Used  Substance and Sexual Activity  . Alcohol use:  No  . Drug use: No  . Sexual activity: Not Currently  Other Topics Concern  . Not on file  Social History Narrative   Retired. High school degree. Married to Jasmine Fitzgerald. 3 adult children. Currently lives with her spouse.   Social Determinants of Health   Financial Resource Strain: Not on file  Food Insecurity: Not on file  Transportation Needs: Not on file  Physical Activity: Not on file  Stress: Not on file  Social Connections: Not on file  Intimate Partner Violence: Not on file    Outpatient Medications Prior to Visit  Medication Sig Dispense Refill  . ALPRAZolam (XANAX) 0.25 MG tablet Take 1 tablet (0.25 mg total) by mouth at bedtime as needed for sleep. 30 tablet 1  . AMBULATORY NON FORMULARY MEDICATION Medication Name: Test strips.  Check glucose 2 times per week. #100.  Dx:  Diabetes 1 vial 0  . aspirin 81 MG tablet Take by mouth.    . blood glucose meter kit and supplies KIT Dispense based on patient and insurance preference. Use up to once daily as directed. (FOR ICD-9 250.00, 250.01). 1 each PRN  . celecoxib (CELEBREX) 200 MG capsule Take 1 capsule (200 mg total) by mouth 2 (two) times daily. 60 capsule 6  . Cholecalciferol (VITAMIN D3) 25 MCG (1000 UT) CAPS Take 2 capsules by mouth daily.    . clobetasol (TEMOVATE) 0.05 % external solution Apply to scalp once or twice daily when needed for itching.  5  . FLUoxetine (PROZAC) 20 MG capsule Take 1 capsule (20 mg total) by mouth daily. 30 capsule 1  . RESTASIS 0.05 % ophthalmic emulsion 1 drop 2 (two) times daily.    . TRUE METRIX BLOOD GLUCOSE TEST test strip USE UP TO ONCE DAILY AS DIRECTED 50 each 3  . Turmeric (CURCUMIN 95 PO) Take 3 tablets by mouth daily.    Marland Kitchen zinc gluconate 50 MG tablet Take 50 mg by mouth daily.    Marland Kitchen zolpidem (AMBIEN) 5 MG tablet Take 1 tablet (5 mg total) by mouth at bedtime as needed for sleep. 30 tablet 5  . losartan (COZAAR) 50 MG tablet Take 1 tablet (50 mg total) by mouth daily. 90 tablet 1   No facility-administered medications prior to visit.    Allergies  Allergen Reactions  . Other Itching    Steri strips at time of 12/2016 surg  . Tape Itching    Steri strips at time of 12/2016 surg  . Codeine Nausea And Vomiting and Other (See Comments)    GI Upset  . Hydrocodone Nausea And Vomiting    ROS Review of Systems    Objective:    Physical Exam  BP (!) 123/51   Pulse 65   Ht '5\' 2"'  (1.575 m)   Wt 147 lb (66.7 kg)   SpO2 99%   BMI 26.89 kg/m  Wt Readings from Last 3 Encounters:  12/01/20 147 lb (66.7 kg)  10/20/20 149 lb (67.6 kg)  09/29/20 148 lb (67.1 kg)     Health Maintenance Due  Topic Date Due  . COVID-19 Vaccine (3 - Moderna risk 4-dose series) 01/20/2020  . FOOT EXAM  09/08/2020    There are no preventive care reminders to display for this  patient.  Lab Results  Component Value Date   TSH 1.180 05/07/2019   Lab Results  Component Value Date   WBC 5.7 05/07/2019   HGB 13.1 05/07/2019   HCT 38.8 05/07/2019   MCV 93 05/07/2019  PLT 209 05/07/2019   Lab Results  Component Value Date   NA 141 07/10/2020   K 4.1 07/10/2020   CO2 25 07/10/2020   GLUCOSE 101 (H) 07/10/2020   BUN 16 07/10/2020   CREATININE 0.71 07/10/2020   BILITOT 1.0 07/10/2020   ALKPHOS 37 (L) 07/10/2020   AST 12 07/10/2020   ALT 8 07/10/2020   PROT 6.3 07/10/2020   ALBUMIN 4.3 07/10/2020   CALCIUM 9.2 07/10/2020   Lab Results  Component Value Date   CHOL 171 07/10/2020   Lab Results  Component Value Date   HDL 77 07/10/2020   Lab Results  Component Value Date   LDLCALC 83 07/10/2020   Lab Results  Component Value Date   TRIG 56 07/10/2020   Lab Results  Component Value Date   CHOLHDL 2.2 07/10/2020   Lab Results  Component Value Date   HGBA1C 5.5 12/01/2020      Assessment & Plan:   Problem List Items Addressed This Visit      Cardiovascular and Mediastinum   Essential hypertension, benign - Primary    Well controlled. Continue current regimen. Follow up in  6 mo      Relevant Medications   losartan (COZAAR) 25 MG tablet     Endocrine   Controlled type 2 diabetes mellitus without complication, without long-term current use of insulin (HCC)    Hemoglobin A1c looks fantastic today. Is really done a great job. Follow-up in 6 months.   Lab Results  Component Value Date   HGBA1C 5.5 12/01/2020         Relevant Medications   losartan (COZAAR) 25 MG tablet   Other Relevant Orders   POCT glycosylated hemoglobin (Hb A1C) (Completed)     Other   Depression with anxiety    She did have a significant improvement in her symptom scores with the fluoxetine but I am concerned about the persistent sedation that she has been experiencing. Also the intermittent tremor which could certainly be a side effect of the  medication we discussed options. 1 is to continue the medication as it is since it does seem to be helping and just continuing to monitor the symptoms as long as they are not worsening. We also discussed the possibility of adding something like Wellbutrin if needed. Or switching medications completely to find something similar that might be just as effective but without the sedation and tremor. She says for now she is okay with discontinuing the 20 mg fluoxetine for at least 1 more month to see if some of the symptoms seem to ease off and go away if they do not then when we regroup in about a month we can always try going back down to 10 mg and or possibly switching medication.       Other Visit Diagnoses    Venous stasis dermatitis of both lower extremities         Atypical chest pain-does not sound cardiac in nature is happening more when she lays down flat at night. History of hiatal hernia. Recommend a trial of a PPI at least for 2 weeks to see if she feels like that is helpful. If the discomfort is still continuing then encouraged her to please let me know.  Discoloration of lower legs consistent with venous stasis dermatitis. Discussed how this actually happens and unfortunately the staining is usually permanent. But certainly we can keep it from getting worse did encourage her to start wearing compression stockings more than  happy to refer her back to a vein specialist if she would like they cannot necessarily undo the dermatitis but they may be able to treat some of her veins if they are bothering her.  Meds ordered this encounter  Medications  . losartan (COZAAR) 25 MG tablet    Sig: Take 1 tablet (25 mg total) by mouth daily.    Dispense:  90 tablet    Refill:  1    Follow-up: Return in about 4 weeks (around 12/29/2020) for mood medication .   I spent 30 minutes on the day of the encounter to include pre-visit record review, face-to-face time with the patient and post visit ordering of  test.   Beatrice Lecher, MD

## 2020-12-01 NOTE — Assessment & Plan Note (Signed)
Well controlled. Continue current regimen. Follow up in  6 mo  

## 2020-12-09 ENCOUNTER — Other Ambulatory Visit: Payer: Self-pay | Admitting: Family Medicine

## 2020-12-22 ENCOUNTER — Other Ambulatory Visit: Payer: Self-pay | Admitting: Family Medicine

## 2020-12-22 DIAGNOSIS — I83899 Varicose veins of unspecified lower extremities with other complications: Secondary | ICD-10-CM | POA: Diagnosis not present

## 2020-12-22 DIAGNOSIS — L818 Other specified disorders of pigmentation: Secondary | ICD-10-CM | POA: Diagnosis not present

## 2020-12-22 DIAGNOSIS — L309 Dermatitis, unspecified: Secondary | ICD-10-CM | POA: Diagnosis not present

## 2020-12-22 DIAGNOSIS — I8390 Asymptomatic varicose veins of unspecified lower extremity: Secondary | ICD-10-CM | POA: Diagnosis not present

## 2020-12-22 DIAGNOSIS — D229 Melanocytic nevi, unspecified: Secondary | ICD-10-CM | POA: Diagnosis not present

## 2020-12-22 DIAGNOSIS — L299 Pruritus, unspecified: Secondary | ICD-10-CM | POA: Diagnosis not present

## 2020-12-22 DIAGNOSIS — Z1283 Encounter for screening for malignant neoplasm of skin: Secondary | ICD-10-CM | POA: Diagnosis not present

## 2020-12-22 DIAGNOSIS — L821 Other seborrheic keratosis: Secondary | ICD-10-CM | POA: Diagnosis not present

## 2020-12-22 DIAGNOSIS — Z85828 Personal history of other malignant neoplasm of skin: Secondary | ICD-10-CM | POA: Diagnosis not present

## 2020-12-22 DIAGNOSIS — L570 Actinic keratosis: Secondary | ICD-10-CM | POA: Diagnosis not present

## 2020-12-25 ENCOUNTER — Encounter: Payer: Self-pay | Admitting: Family Medicine

## 2020-12-25 NOTE — Telephone Encounter (Signed)
Needs follow-up in early May for routine diabetic check.

## 2020-12-25 NOTE — Telephone Encounter (Signed)
Patient cancelled and rescheduled for April 1st 2022, stated that she is feeling better and wanted to push appt out. If PCP needed her to come in sooner, she said to let her know. AM

## 2020-12-28 NOTE — Telephone Encounter (Signed)
Patient scheduled for 03/01/2021. AM

## 2020-12-29 ENCOUNTER — Ambulatory Visit: Payer: Medicare Other | Admitting: Family Medicine

## 2021-01-06 DIAGNOSIS — I708 Atherosclerosis of other arteries: Secondary | ICD-10-CM | POA: Diagnosis not present

## 2021-01-06 DIAGNOSIS — Z48812 Encounter for surgical aftercare following surgery on the circulatory system: Secondary | ICD-10-CM | POA: Diagnosis not present

## 2021-01-18 ENCOUNTER — Other Ambulatory Visit: Payer: Self-pay | Admitting: Family Medicine

## 2021-01-29 ENCOUNTER — Other Ambulatory Visit: Payer: Self-pay

## 2021-01-29 ENCOUNTER — Encounter: Payer: Self-pay | Admitting: Family Medicine

## 2021-01-29 ENCOUNTER — Ambulatory Visit (INDEPENDENT_AMBULATORY_CARE_PROVIDER_SITE_OTHER): Payer: Medicare Other | Admitting: Family Medicine

## 2021-01-29 VITALS — BP 145/75 | HR 61 | Wt 145.9 lb

## 2021-01-29 DIAGNOSIS — J069 Acute upper respiratory infection, unspecified: Secondary | ICD-10-CM | POA: Diagnosis not present

## 2021-01-29 DIAGNOSIS — J040 Acute laryngitis: Secondary | ICD-10-CM | POA: Diagnosis not present

## 2021-01-29 NOTE — Patient Instructions (Signed)
Over the counter medications that may be helpful for symptoms:  . Guaifenesin 1200 mg extended release tabs twice daily, with plenty of water o For cough and congestion o Brand name: Mucinex   . Pseudoephedrine 30 mg, one or two tabs every 4 to 6 hours o For sinus congestion o Brand name: Sudafed o You must get this from the pharmacy counter.  . Oxymetazoline nasal spray each morning, one spray in each nostril, for NO MORE THAN 3 days  o For nasal and sinus congestion o Brand name: Afrin . Saline nasal spray or Saline Nasal Irrigation 3-5 times a day o For nasal and sinus congestion o Brand names: [place] or AYR . Fluticasone nasal spray, one spray in each nostril, each morning (after oxymetazoline and saline, if used) o For nasal and sinus congestion o Brand name: Flonase . Warm salt water gargles  o For sore throat o Every few hours as needed . Alternate ibuprofen 400-600 mg and acetaminophen 1000 mg every 4-6 hours o For fever, body aches, headache o Brand names: Motrin or Advil and Tylenol . Dextromethorphan 12-hour cough version 30 mg every 12 hours  o For cough o Brand name: Delsym .   If you develop severe shortness of breath, uncontrolled fevers, coughing up blood, confusion, chest pain, or signs of dehydration (such as significantly decreased urine amounts or dizziness with standing) please go to the ER.   Laryngitis Laryngitis is irritation and swelling (inflammation) of your vocal cords. This condition causes symptoms such as:  A change in your voice. It may sound low and hoarse.  Loss of voice.  Coughing.  Sore throat.  Dry throat.  Stuffy nose. Depending on the cause, this condition may go away after a short time or may last for more than 3 weeks. Treatment often involves resting your voice and using medicines to soothe your throat. Follow these instructions at home: Medicines  Take over-the-counter and prescription medicines only as told by your  doctor.  If you were prescribed an antibiotic medicine, take it as told by your doctor. Do not stop taking it even if you start to feel better. General instructions  Talk as little as possible. Also avoid whispering.  Write instead of talking. Do this until your voice is back to normal.  Drink enough fluid to keep your pee (urine) pale yellow.  Breathe in moist air. Use a humidifier if you live in a dry climate.  Do not use any products that have nicotine or tobacco in them, such as cigarettes and e-cigarettes. If you need help quitting, ask your doctor. Contact a doctor if:  You have a fever.  Your pain is worse.  Your symptoms do not get better in 2 weeks. Get help right away if:  You cough up blood.  You have trouble swallowing.  You have trouble breathing. Summary  Laryngitis is inflammation of your vocal cords.  This condition causes your voice to sound low and hoarse.  Rest your voice by talking as little as possible. Also avoid whispering. This information is not intended to replace advice given to you by your health care provider. Make sure you discuss any questions you have with your health care provider. Document Revised: 10/04/2017 Document Reviewed: 10/04/2017 Elsevier Patient Education  2021 [employer].

## 2021-01-29 NOTE — Progress Notes (Signed)
Acute Office Visit  Subjective:    Patient ID: Jasmine Fitzgerald, female    DOB: Jul 22, 1946, 75 y.o.   MRN: [id number]  Chief Complaint  Patient presents with  . Sore Throat    HPI Patient is in today for sore throat and URI symptoms.   Patient reports on Tuesday night she developed a sore throat that has progressed to some hoarseness, mild frontal sinus pressure and headache, nasal congestion with post nasal drip. She has been more tired the past few days and wanted to just lie around, but she has not had any chest pain, shortness of breath, ear pain, lymphadenopathy, nausea, vomiting, diarrhea, loss of taste/smell, fevers. She has been using mucinex, theraflu, and chloraseptic throat spray which have been helping with her symptoms.   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 Her husband also just a had a bad cold that led to a sinus infection. No other sick contacts.      Past Medical History:  Diagnosis Date  . Carpal tunnel syndrome   . Colon polyp   . Kidney stones   . Renal cyst    benign    Past Surgical History:  Procedure Laterality Date  . ABDOMINAL SURGERY  12/16/2016   Pancreaticoduodenal aortic aneurysm repair  . BREAST REDUCTION SURGERY  2013  . HERNIA REPAIR  1996  . KIDNEY STONE SURGERY  2015  . KNEE ARTHROSCOPY    . TONSILLECTOMY  1957  . TRIGGER FINGER RELEASE  07/2015  . UPPER GASTROINTESTINAL ENDOSCOPY      Family History  Problem Relation Age of Onset  . Heart failure Mother   . Heart failure Father   . Heart attack Father   . Diabetes Brother   . Cancer Maternal Aunt   . Cancer Maternal Uncle   . Ovarian cancer Unknown   . Lung cancer Unknown   . Throat cancer Unknown   . Stroke Maternal Grandmother     Social History   Socioeconomic History  . Marital status: Married    Spouse name: Patrick Jupiter  . Number of children: 3  . Years of education: 19  . Highest education  level: Not on file  Occupational History  . Occupation: Retired  Tobacco Use  . Smoking status: Never Smoker  . Smokeless tobacco: Never Used  Substance and Sexual Activity  . Alcohol use: No  . Drug use: No  . Sexual activity: Not Currently  Other Topics Concern  . Not on file  Social History Narrative   Retired. High school degree. Married to [place]. 3 adult children. Currently lives with her spouse.   Social Determinants of Health   Financial Resource Strain: Not on file  Food Insecurity: Not on file  Transportation Needs: Not on file  Physical Activity: Not on file  Stress: Not on file  Social Connections: Not on file  Intimate Partner Violence: Not on file    Outpatient Medications Prior to Visit  Medication Sig Dispense Refill  . ALPRAZolam (XANAX) 0.25 MG tablet Take 1 tablet (0.25 mg total) by mouth at bedtime as needed for sleep. 30 tablet 1  . AMBULATORY NON FORMULARY MEDICATION Medication Name: Test strips.  Check glucose 2 times per week. #100.  Dx: Diabetes 1 vial 0  . aspirin 81 MG tablet Take by mouth.    . blood glucose meter kit and supplies KIT Dispense based on patient and insurance preference. Use  up to once daily as directed. (FOR ICD-9 250.00, 250.01). 1 each PRN  . celecoxib (CELEBREX) 200 MG capsule Take 1 capsule (200 mg total) by mouth 2 (two) times daily. 60 capsule 6  . Cholecalciferol (VITAMIN D3) 25 MCG (1000 UT) CAPS Take 2 capsules by mouth daily.    . clobetasol (TEMOVATE) 0.05 % external solution Apply to scalp once or twice daily when needed for itching.  5  . FLUoxetine (PROZAC) 20 MG capsule Take 1 capsule (20 mg total) by mouth daily. 30 capsule 0  . losartan (COZAAR) 25 MG tablet Take 1 tablet (25 mg total) by mouth daily. 90 tablet 1  . RESTASIS 0.05 % ophthalmic emulsion 1 drop 2 (two) times daily.    . TRUE METRIX BLOOD GLUCOSE TEST test strip USE UP TO ONCE DAILY AS DIRECTED 50 each 3  . Turmeric (CURCUMIN 95 PO) Take 3 tablets by mouth  daily.    Marland Kitchen zinc gluconate 50 MG tablet Take 50 mg by mouth daily.    Marland Kitchen zolpidem (AMBIEN) 5 MG tablet Take 1 tablet (5 mg total) by mouth at bedtime as needed for sleep. 30 tablet 5   No facility-administered medications prior to visit.    Allergies  Allergen Reactions  . Other Itching    Steri strips at time of 12/2016 surg  . Tape Itching    Steri strips at time of 12/2016 surg  . Codeine Nausea And Vomiting and Other (See Comments)    GI Upset  . Hydrocodone Nausea And Vomiting    Review of Systems All review of systems negative except what is listed in the HPI     Objective:    Physical Exam Vitals reviewed.  Constitutional:      Appearance: She is well-developed.  HENT:     Head: Normocephalic and atraumatic.     Nose: Congestion and rhinorrhea present.     Mouth/Throat:     Mouth: Mucous membranes are moist. No oral lesions.     Pharynx: Posterior oropharyngeal erythema present. No pharyngeal swelling, oropharyngeal exudate or uvula swelling.     Tonsils: No tonsillar exudate or tonsillar abscesses.  Cardiovascular:     Rate and Rhythm: Normal rate and regular rhythm.     Heart sounds: Normal heart sounds.  Pulmonary:     Effort: Pulmonary effort is normal. No respiratory distress.     Breath sounds: Normal breath sounds. No wheezing, rhonchi or rales.  Musculoskeletal:     Cervical back: Normal range of motion.  Lymphadenopathy:     Cervical: No cervical adenopathy.  Skin:    General: Skin is warm and dry.  Neurological:     General: No focal deficit present.     Mental Status: She is alert and oriented to person, place, and time.  Psychiatric:        Mood and Affect: Mood normal.        Behavior: Behavior normal.     BP (!) 145/75   Pulse 61   Wt 145 lb 14.4 oz (66.2 kg)   SpO2 98%   BMI 26.69 kg/m  Wt Readings from Last 3 Encounters:  01/29/21 145 lb 14.4 oz (66.2 kg)  12/01/20 147 lb (66.7 kg)  10/20/20 149 lb (67.6 kg)    Health Maintenance  Due  Topic Date Due  . COVID-19 Vaccine (3 - Moderna risk 4-dose series) 01/20/2020  . FOOT EXAM  09/08/2020    There are no preventive care reminders to display for  this patient.   Lab Results  Component Value Date   TSH 1.180 05/07/2019   Lab Results  Component Value Date   WBC 5.7 05/07/2019   HGB 13.1 05/07/2019   HCT 38.8 05/07/2019   MCV 93 05/07/2019   PLT 209 05/07/2019   Lab Results  Component Value Date   NA 141 07/10/2020   K 4.1 07/10/2020   CO2 25 07/10/2020   GLUCOSE 101 (H) 07/10/2020   BUN 16 07/10/2020   CREATININE 0.71 07/10/2020   BILITOT 1.0 07/10/2020   ALKPHOS 37 (L) 07/10/2020   AST 12 07/10/2020   ALT 8 07/10/2020   PROT 6.3 07/10/2020   ALBUMIN 4.3 07/10/2020   CALCIUM 9.2 07/10/2020   Lab Results  Component Value Date   CHOL 171 07/10/2020   Lab Results  Component Value Date   HDL 77 07/10/2020   Lab Results  Component Value Date   LDLCALC 83 07/10/2020   Lab Results  Component Value Date   TRIG 56 07/10/2020   Lab Results  Component Value Date   CHOLHDL 2.2 07/10/2020   Lab Results  Component Value Date   HGBA1C 5.5 12/01/2020       Assessment & Plan:   1. Laryngitis 2. Viral upper respiratory tract infection Patient likely with viral URI - only about 4 days of relatively mild symptoms so far. Educated on bacterial vs viral infections and antibiotic stewardship. Recommending we continue with conservative measures now, OTC cold medicines and analgesics, mucinex, flonase, rest, hydration, warm liquids, salt water gargles, cough drops/chloraseptic spray; also recommending restarting a daily antihistamine like Claritin or Zyrtec during her allergy season as it sounds like there could be an allergic component to this episode. Educated on signs and symptoms requiring further evaluation. If not feeling any better by the end of next week can consider antibiotics.  Follow-up if symptoms worsen or fail to improve.   Purcell Nails  Olevia Bowens, DNP, FNP-C

## 2021-02-02 ENCOUNTER — Ambulatory Visit: Payer: Medicare Other | Admitting: Family Medicine

## 2021-02-16 DIAGNOSIS — H43813 Vitreous degeneration, bilateral: Secondary | ICD-10-CM | POA: Diagnosis not present

## 2021-02-16 DIAGNOSIS — H2513 Age-related nuclear cataract, bilateral: Secondary | ICD-10-CM | POA: Diagnosis not present

## 2021-02-16 DIAGNOSIS — H524 Presbyopia: Secondary | ICD-10-CM | POA: Diagnosis not present

## 2021-02-19 ENCOUNTER — Other Ambulatory Visit: Payer: Self-pay | Admitting: Family Medicine

## 2021-03-01 ENCOUNTER — Other Ambulatory Visit: Payer: Self-pay

## 2021-03-01 ENCOUNTER — Encounter: Payer: Self-pay | Admitting: Family Medicine

## 2021-03-01 ENCOUNTER — Ambulatory Visit (INDEPENDENT_AMBULATORY_CARE_PROVIDER_SITE_OTHER): Payer: Medicare Other | Admitting: Family Medicine

## 2021-03-01 VITALS — BP 119/53 | HR 60 | Ht 62.0 in | Wt 145.0 lb

## 2021-03-01 DIAGNOSIS — R300 Dysuria: Secondary | ICD-10-CM

## 2021-03-01 DIAGNOSIS — F418 Other specified anxiety disorders: Secondary | ICD-10-CM | POA: Diagnosis not present

## 2021-03-01 DIAGNOSIS — R319 Hematuria, unspecified: Secondary | ICD-10-CM | POA: Diagnosis not present

## 2021-03-01 DIAGNOSIS — I10 Essential (primary) hypertension: Secondary | ICD-10-CM

## 2021-03-01 LAB — POCT URINALYSIS DIP (CLINITEK)
Bilirubin, UA: NEGATIVE
Glucose, UA: NEGATIVE mg/dL
Ketones, POC UA: NEGATIVE mg/dL
Leukocytes, UA: NEGATIVE
Nitrite, UA: NEGATIVE
POC PROTEIN,UA: NEGATIVE
Spec Grav, UA: 1.015 (ref 1.010–1.025)
Urobilinogen, UA: 0.2 E.U./dL
pH, UA: 6 (ref 5.0–8.0)

## 2021-03-01 NOTE — Progress Notes (Signed)
Established Patient Office Visit  Subjective:  Patient ID: Jasmine Fitzgerald, female    DOB: July 22, 1946  Age: 75 y.o. MRN: [id number]  CC:  Chief Complaint  Patient presents with  . Follow-up    HPI [name] presents for   Follow-up depression/anxiety-she was recently started on fluoxetine in January and was noticing improvement in some of her symptoms but she also noticed some increased sedation, as well as an intermittent tremor.  She says that the shaking actually got significantly better in fact it finally just resolved.  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  Was also having some atypical chest pain in February that did not sound consistent with cardiac disease I recommended a trial of a PPI for 2 weeks to see if it was helping she does have a history of a hiatal hernia.  She actually felt like it did improve her symptoms and they have completely resolved.  Over the last week she has been having some bilateral mid back pain.  But again she has been doing a lot of digging and lifting.  She is also noticed a little bit of burning with urination she said she has never had a UTI before she has not noticed any discoloration of the urine or blood.  She denies any vaginal rash or irritation.  Past Medical History:  Diagnosis Date  . Carpal tunnel syndrome   . Colon polyp   . Kidney stones   . Renal cyst    benign    Past Surgical History:  Procedure Laterality Date  . ABDOMINAL SURGERY  12/16/2016   Pancreaticoduodenal aortic aneurysm repair  . BREAST REDUCTION SURGERY  2013  . HERNIA REPAIR  1996  . KIDNEY STONE SURGERY  2015  . KNEE ARTHROSCOPY    . TONSILLECTOMY  1957  . TRIGGER FINGER RELEASE  07/2015  . UPPER GASTROINTESTINAL ENDOSCOPY      Family History  Problem Relation  Age of Onset  . Heart failure Mother   . Heart failure Father   . Heart attack Father   . Diabetes Brother   . Cancer Maternal Aunt   . Cancer Maternal Uncle   . Ovarian cancer Unknown   . Lung cancer Unknown   . Throat cancer Unknown   . Stroke Maternal Grandmother     Social History   Socioeconomic History  . Marital status: Married    Spouse name: Patrick Jupiter  . Number of children: 3  . Years of education: 8  . Highest education level: Not on file  Occupational History  . Occupation: Retired  Tobacco Use  . Smoking status: Never Smoker  . Smokeless tobacco: Never Used  Substance and Sexual Activity  . Alcohol use: No  . Drug use: No  . Sexual activity: Not Currently  Other Topics Concern  . Not on file  Social History Narrative   Retired. High school degree. Married to [place]. 3 adult children. Currently lives with her spouse.   Social Determinants of Health   Financial Resource Strain: Not on file  Food Insecurity: Not on file  Transportation Needs: Not on file  Physical Activity: Not on file  Stress: Not on file  Social Connections: Not on file  Intimate Partner  Violence: Not on file    Outpatient Medications Prior to Visit  Medication Sig Dispense Refill  . ALPRAZolam (XANAX) 0.25 MG tablet Take 1 tablet (0.25 mg total) by mouth at bedtime as needed for sleep. 30 tablet 1  . AMBULATORY NON FORMULARY MEDICATION Medication Name: Test strips.  Check glucose 2 times per week. #100.  Dx: Diabetes 1 vial 0  . aspirin 81 MG tablet Take by mouth.    . blood glucose meter kit and supplies KIT Dispense based on patient and insurance preference. Use up to once daily as directed. (FOR ICD-9 250.00, 250.01). 1 each PRN  . celecoxib (CELEBREX) 200 MG capsule Take 1 capsule (200 mg total) by mouth 2 (two) times daily. 60 capsule 6  . Cholecalciferol (VITAMIN D3) 25 MCG (1000 UT) CAPS Take 2 capsules by mouth daily.    . clobetasol (TEMOVATE) 0.05 % external solution Apply to  scalp once or twice daily when needed for itching.  5  . FLUoxetine (PROZAC) 20 MG capsule Take 1 capsule (20 mg total) by mouth daily. appt for refills 30 capsule 0  . losartan (COZAAR) 25 MG tablet Take 1 tablet (25 mg total) by mouth daily. 90 tablet 1  . RESTASIS 0.05 % ophthalmic emulsion 1 drop 2 (two) times daily.    Marland Kitchen triamcinolone cream (KENALOG) 0.1 % Apply to irritated & itchy areas of the body once or twice daily when needed. May mix in equal parts with moisturizer. Not to face.  3  . TRUE METRIX BLOOD GLUCOSE TEST test strip USE UP TO ONCE DAILY AS DIRECTED 50 each 3  . Turmeric (CURCUMIN 95 PO) Take 3 tablets by mouth daily.    Marland Kitchen zinc gluconate 50 MG tablet Take 50 mg by mouth daily.    Marland Kitchen zolpidem (AMBIEN) 5 MG tablet Take 1 tablet (5 mg total) by mouth at bedtime as needed for sleep. 30 tablet 5   No facility-administered medications prior to visit.    Allergies  Allergen Reactions  . Other Itching    Steri strips at time of 12/2016 surg  . Tape Itching    Steri strips at time of 12/2016 surg  . Codeine Nausea And Vomiting and Other (See Comments)    GI Upset  . Hydrocodone Nausea And Vomiting    ROS Review of Systems    Objective:    Physical Exam  BP (!) 119/53   Pulse 60   Ht _0  (1.575 m)   Wt 145 lb (65.8 kg)   SpO2 98%   BMI 26.52 kg/m  Wt Readings from Last 3 Encounters:  03/01/21 145 lb (65.8 kg)  01/29/21 145 lb 14.4 oz (66.2 kg)  12/01/20 147 lb (66.7 kg)     Health Maintenance Due  Topic Date Due  . COVID-19 Vaccine (3 - Moderna risk 4-dose series) 01/20/2020  . FOOT EXAM  09/08/2020    There are no preventive care reminders to display for this patient.  Lab Results  Component Value Date   TSH 1.180 05/07/2019   Lab Results  Component Value Date   WBC 5.7 05/07/2019   HGB 13.1 05/07/2019   HCT 38.8 05/07/2019   MCV 93 05/07/2019   PLT 209 05/07/2019   Lab Results  Component Value Date   NA 141 07/10/2020   K 4.1 07/10/2020    CO2 25 07/10/2020   GLUCOSE 101 (H) 07/10/2020   BUN 16 07/10/2020   CREATININE 0.71 07/10/2020   BILITOT 1.0 07/10/2020  ALKPHOS 37 (L) 07/10/2020   AST 12 07/10/2020   ALT 8 07/10/2020   PROT 6.3 07/10/2020   ALBUMIN 4.3 07/10/2020   CALCIUM 9.2 07/10/2020   Lab Results  Component Value Date   CHOL 171 07/10/2020   Lab Results  Component Value Date   HDL 77 07/10/2020   Lab Results  Component Value Date   LDLCALC 83 07/10/2020   Lab Results  Component Value Date   TRIG 56 07/10/2020   Lab Results  Component Value Date   CHOLHDL 2.2 07/10/2020   Lab Results  Component Value Date   HGBA1C 5.5 12/01/2020      Assessment & Plan:   Problem List Items Addressed This Visit      Cardiovascular and Mediastinum   Essential hypertension, benign - Primary    Well controlled. Continue current regimen. Follow up in  6 mo        Other   Depression with anxiety    As far she has been really happy with the results on the fluoxetine and happy with her current dosing regimen.  We will continue this and plan to follow-up in about 3 or 4 months to make sure she is still doing well at that time.  She does use alprazolam sparingly and we have not sent a prescription since October.       Other Visit Diagnoses    Dysuria       Relevant Orders   POCT URINALYSIS DIP (CLINITEK) (Completed)   Urine Culture   Hematuria, unspecified type       Relevant Orders   Urine Culture     Back pain, mid back-likely secondary to a lot of bending and increased activity.  Dysuria-we will send off urinalysis for culture.  Initial UA negative.  She denies any vaginal irritation.  Call if any new or worsening symptoms while awaiting the culture results.  No orders of the defined types were placed in this encounter.   Follow-up: Return in about 4 months (around 07/02/2021) for Diabetes follow-up and Mood.    Beatrice Lecher, MD

## 2021-03-01 NOTE — Assessment & Plan Note (Signed)
As far she has been really happy with the results on the fluoxetine and happy with her current dosing regimen.  We will continue this and plan to follow-up in about 3 or 4 months to make sure she is still doing well at that time.  She does use alprazolam sparingly and we have not sent a prescription since October.

## 2021-03-01 NOTE — Assessment & Plan Note (Signed)
Well controlled. Continue current regimen. Follow up in  6 mo  

## 2021-03-03 LAB — URINE CULTURE
MICRO NUMBER:: 11841334
Result:: NO GROWTH
SPECIMEN QUALITY:: ADEQUATE

## 2021-03-16 ENCOUNTER — Other Ambulatory Visit: Payer: Self-pay | Admitting: Family Medicine

## 2021-03-26 ENCOUNTER — Telehealth: Payer: Self-pay | Admitting: Family Medicine

## 2021-03-26 NOTE — Progress Notes (Signed)
  Chronic Care Management   Outreach Note  03/26/2021 Name: BOOTS MCGLOWN MRN: [id number] DOB: 1946/04/07  Referred by: Hali Marry, MD Reason for referral : No chief complaint on file.   An unsuccessful telephone outreach was attempted today. The patient was referred to the pharmacist for assistance with care management and care coordination.   Follow Up Plan:   Lauretta Grill Upstream Scheduler

## 2021-04-15 ENCOUNTER — Telehealth: Payer: Self-pay | Admitting: Family Medicine

## 2021-04-15 NOTE — Progress Notes (Signed)
  Chronic Care Management   Outreach Note  04/15/2021 Name: RHINA KRAMME MRN: [id number] DOB: Oct 20, 1946  Referred by: Hali Marry, MD Reason for referral : No chief complaint on file.   A second unsuccessful telephone outreach was attempted today. The patient was referred to pharmacist for assistance with care management and care coordination.  Follow Up Plan:   Lauretta Grill Upstream Scheduler

## 2021-04-26 DIAGNOSIS — Z1231 Encounter for screening mammogram for malignant neoplasm of breast: Secondary | ICD-10-CM | POA: Diagnosis not present

## 2021-04-26 LAB — HM MAMMOGRAPHY

## 2021-04-27 ENCOUNTER — Telehealth: Payer: Self-pay | Admitting: Family Medicine

## 2021-04-27 NOTE — Progress Notes (Signed)
  Chronic Care Management   Outreach Note  04/27/2021 Name: Jasmine Fitzgerald MRN: [id number] DOB: 1946/06/07  Referred by: Hali Marry, MD Reason for referral : No chief complaint on file.   Third unsuccessful telephone outreach was attempted today. The patient was referred to the pharmacist for assistance with care management and care coordination.   Follow Up Plan:   Lauretta Grill Upstream Scheduler

## 2021-05-11 ENCOUNTER — Other Ambulatory Visit: Payer: Self-pay | Admitting: Family Medicine

## 2021-06-04 ENCOUNTER — Other Ambulatory Visit: Payer: Self-pay | Admitting: Family Medicine

## 2021-06-04 DIAGNOSIS — I10 Essential (primary) hypertension: Secondary | ICD-10-CM

## 2021-06-22 ENCOUNTER — Other Ambulatory Visit: Payer: Self-pay | Admitting: Family Medicine

## 2021-06-30 ENCOUNTER — Other Ambulatory Visit: Payer: Self-pay | Admitting: Family Medicine

## 2021-07-13 ENCOUNTER — Other Ambulatory Visit: Payer: Self-pay

## 2021-07-13 ENCOUNTER — Ambulatory Visit (INDEPENDENT_AMBULATORY_CARE_PROVIDER_SITE_OTHER): Payer: Medicare Other | Admitting: Family Medicine

## 2021-07-13 ENCOUNTER — Encounter: Payer: Self-pay | Admitting: Family Medicine

## 2021-07-13 VITALS — BP 129/53 | HR 67 | Ht 62.0 in | Wt 147.0 lb

## 2021-07-13 DIAGNOSIS — F418 Other specified anxiety disorders: Secondary | ICD-10-CM | POA: Diagnosis not present

## 2021-07-13 DIAGNOSIS — F5101 Primary insomnia: Secondary | ICD-10-CM

## 2021-07-13 DIAGNOSIS — I10 Essential (primary) hypertension: Secondary | ICD-10-CM | POA: Diagnosis not present

## 2021-07-13 DIAGNOSIS — E119 Type 2 diabetes mellitus without complications: Secondary | ICD-10-CM | POA: Diagnosis not present

## 2021-07-13 DIAGNOSIS — L853 Xerosis cutis: Secondary | ICD-10-CM

## 2021-07-13 DIAGNOSIS — Z23 Encounter for immunization: Secondary | ICD-10-CM | POA: Diagnosis not present

## 2021-07-13 LAB — POCT GLYCOSYLATED HEMOGLOBIN (HGB A1C): Hemoglobin A1C: 5.7 % — AB (ref 4.0–5.6)

## 2021-07-13 MED ORDER — FLUOXETINE HCL 20 MG PO CAPS: 20.0000 mg | ORAL_CAPSULE | Freq: Every day | ORAL | 5 refills | Status: DC

## 2021-07-13 MED ORDER — CELECOXIB 200 MG PO CAPS: 200.0000 mg | ORAL_CAPSULE | Freq: Two times a day (BID) | ORAL | 6 refills | Status: DC

## 2021-07-13 MED ORDER — LOSARTAN POTASSIUM 25 MG PO TABS: 25.0000 mg | ORAL_TABLET | Freq: Every day | ORAL | 1 refills | Status: DC

## 2021-07-13 NOTE — Assessment & Plan Note (Signed)
Doing well on fluoxetine.  We will continue with current dose we discussed may be a trial of decreasing the dose to 10 mg when I see her back in the spring.

## 2021-07-13 NOTE — Assessment & Plan Note (Signed)
Well controlled. Continue current regimen. Follow up in  6 mo  

## 2021-07-13 NOTE — Assessment & Plan Note (Signed)
A1c 5.7 today.  Very well controlled on dietary measures only.  She is on an ARB.  But not a statin that we did discuss the potential benefits of a statin and encouraged her to consider it.

## 2021-07-13 NOTE — Progress Notes (Signed)
Established Patient Office Visit  Subjective:  Patient ID: Jasmine Fitzgerald, female    DOB: 31-Jan-1946  Age: 75 y.o. MRN: [id number]  CC:  Chief Complaint  Patient presents with   Diabetes   Hypertension    HPI Jasmine Fitzgerald presents for 6 mo f/u   Diabetes - no hypoglycemic events. No wounds or sores that are not healing well. No increased thirst or urination. Checking glucose at home. Taking medications as prescribed without any side effects.  She is currently diet controlled.  She is on an ARB but not a statin.  She will exercising 2 days/week usually does 3 but her husband has not been feeling well has had some medical problems recently.  Hypertension- Pt denies chest pain, SOB, dizziness, or heart palpitations.  Taking meds as directed w/o problems.  Denies medication side effects.    F/U Depression -feels like the fluoxetine is working well for her.  She has noticed that she is sweating more since starting the medication but does feel like it is effective and is happy with her current regimen.  She does want to discuss tapering the medication at some point.  She does complain of more dry skin.  She feels like it is definitely gotten worse in the last year or 2.  Follow Up insomnia-doing really well on Ambien 5 mg she feels like it is effective and denies any excess sedation or feeling poorly in the mornings.  Past Medical History:  Diagnosis Date   Carpal tunnel syndrome    Colon polyp    Kidney stones    Renal cyst    benign    Past Surgical History:  Procedure Laterality Date   ABDOMINAL SURGERY  12/16/2016   Pancreaticoduodenal aortic aneurysm repair   BREAST REDUCTION SURGERY  2013   [place]   KIDNEY STONE SURGERY  2015   KNEE ARTHROSCOPY     TONSILLECTOMY  1957   TRIGGER FINGER RELEASE  07/2015   UPPER GASTROINTESTINAL ENDOSCOPY      Family History  Problem Relation Age of Onset   Heart failure Mother    Heart failure Father    Heart attack  Father    Diabetes Brother    Cancer Maternal Aunt    Cancer Maternal Uncle    Ovarian cancer Unknown    Lung cancer Unknown    Throat cancer Unknown    Stroke Maternal Grandmother     Social History   Socioeconomic History   Marital status: Married    Spouse name: Patrick Jupiter   Number of children: 3   Years of education: 12   Highest education level: Not on file  Occupational History   Occupation: Retired  Tobacco Use   Smoking status: Never   Smokeless tobacco: Never  Substance and Sexual Activity   Alcohol use: No   Drug use: No   Sexual activity: Not Currently  Other Topics Concern   Not on file  Social History Narrative   Retired. High school degree. Married to [place]. 3 adult children. Currently lives with her spouse.   Social Determinants of Health   Financial Resource Strain: Not on file  Food Insecurity: Not on file  Transportation Needs: Not on file  Physical Activity: Not on file  Stress: Not on file  Social Connections: Not on file  Intimate Partner Violence: Not on file    Outpatient Medications Prior to Visit  Medication Sig Dispense Refill   ALPRAZolam (XANAX) 0.25 MG tablet  Take 1 tablet (0.25 mg total) by mouth at bedtime as needed for sleep. 30 tablet 1   AMBULATORY NON FORMULARY MEDICATION Medication Name: Test strips.  Check glucose 2 times per week. #100.  Dx: Diabetes 1 vial 0   aspirin 81 MG tablet Take by mouth.     blood glucose meter kit and supplies KIT Dispense based on patient and insurance preference. Use up to once daily as directed. (FOR ICD-9 250.00, 250.01). 1 each PRN   Cholecalciferol (VITAMIN D3) 25 MCG (1000 UT) CAPS Take 2 capsules by mouth daily.     clobetasol (TEMOVATE) 0.05 % external solution Apply to scalp once or twice daily when needed for itching.  5   RESTASIS 0.05 % ophthalmic emulsion 1 drop 2 (two) times daily.     triamcinolone cream (KENALOG) 0.1 % Apply to irritated & itchy areas of the body once or twice daily when  needed. May mix in equal parts with moisturizer. Not to face.  3   TRUE METRIX BLOOD GLUCOSE TEST test strip USE UP TO ONCE DAILY AS DIRECTED 50 each 3   Turmeric (CURCUMIN 95 PO) Take 3 tablets by mouth daily.     zinc gluconate 50 MG tablet Take 50 mg by mouth daily.     zolpidem (AMBIEN) 5 MG tablet Take 1 tablet (5 mg total) by mouth at bedtime as needed for sleep. 30 tablet 2   celecoxib (CELEBREX) 200 MG capsule Take 1 capsule (200 mg total) by mouth 2 (two) times daily. 60 capsule 6   FLUoxetine (PROZAC) 20 MG capsule Take 1 capsule (20 mg total) by mouth daily. 30 capsule 1   losartan (COZAAR) 25 MG tablet Take 1 tablet (25 mg total) by mouth daily. 90 tablet 0   No facility-administered medications prior to visit.    Allergies  Allergen Reactions   Other Itching    Steri strips at time of 12/2016 surg   Tape Itching    Steri strips at time of 12/2016 surg   Codeine Nausea And Vomiting and Other (See Comments)    GI Upset   Hydrocodone Nausea And Vomiting    ROS Review of Systems    Objective:    Physical Exam Constitutional:      Appearance: Normal appearance. She is well-developed.  HENT:     Head: Normocephalic and atraumatic.  Cardiovascular:     Rate and Rhythm: Normal rate and regular rhythm.     Heart sounds: Normal heart sounds.  Pulmonary:     Effort: Pulmonary effort is normal.     Breath sounds: Normal breath sounds.  Skin:    General: Skin is warm and dry.  Neurological:     Mental Status: She is alert and oriented to person, place, and time.  Psychiatric:        Behavior: Behavior normal.    BP (!) 129/53   Pulse 67   Ht '5\' 2"'  (1.575 m)   Wt 147 lb (66.7 kg)   SpO2 98%   BMI 26.89 kg/m  Wt Readings from Last 3 Encounters:  07/13/21 147 lb (66.7 kg)  03/01/21 145 lb (65.8 kg)  01/29/21 145 lb 14.4 oz (66.2 kg)     Health Maintenance Due  Topic Date Due   COVID-19 Vaccine (4 - Booster for Moderna series) 12/12/2020    There are no  preventive care reminders to display for this patient.  Lab Results  Component Value Date   TSH 1.180 05/07/2019  Lab Results  Component Value Date   WBC 5.7 05/07/2019   HGB 13.1 05/07/2019   HCT 38.8 05/07/2019   MCV 93 05/07/2019   PLT 209 05/07/2019   Lab Results  Component Value Date   NA 141 07/10/2020   K 4.1 07/10/2020   CO2 25 07/10/2020   GLUCOSE 101 (H) 07/10/2020   BUN 16 07/10/2020   CREATININE 0.71 07/10/2020   BILITOT 1.0 07/10/2020   ALKPHOS 37 (L) 07/10/2020   AST 12 07/10/2020   ALT 8 07/10/2020   PROT 6.3 07/10/2020   ALBUMIN 4.3 07/10/2020   CALCIUM 9.2 07/10/2020   Lab Results  Component Value Date   CHOL 171 07/10/2020   Lab Results  Component Value Date   HDL 77 07/10/2020   Lab Results  Component Value Date   LDLCALC 83 07/10/2020   Lab Results  Component Value Date   TRIG 56 07/10/2020   Lab Results  Component Value Date   CHOLHDL 2.2 07/10/2020   Lab Results  Component Value Date   HGBA1C 5.7 (A) 07/13/2021      Assessment & Plan:   Problem List Items Addressed This Visit       Cardiovascular and Mediastinum   Essential hypertension, benign - Primary    Well controlled. Continue current regimen. Follow up in  6 mo       Relevant Medications   losartan (COZAAR) 25 MG tablet   Other Relevant Orders   CMP14+EGFR   Lipid panel   TSH   CBC     Endocrine   Controlled type 2 diabetes mellitus without complication, without long-term current use of insulin (HCC)    A1c 5.7 today.  Very well controlled on dietary measures only.  She is on an ARB.  But not a statin that we did discuss the potential benefits of a statin and encouraged her to consider it.      Relevant Medications   losartan (COZAAR) 25 MG tablet   Other Relevant Orders   POCT glycosylated hemoglobin (Hb A1C) (Completed)   CMP14+EGFR   Lipid panel   TSH   CBC     Other   Primary insomnia    Doing well on zolpidem 5 mg daily.  She has a couple more  refills and then will be due for new prescription.      Depression with anxiety    Doing well on fluoxetine.  We will continue with current dose we discussed may be a trial of decreasing the dose to 10 mg when I see her back in the spring.      Relevant Medications   FLUoxetine (PROZAC) 20 MG capsule   Other Visit Diagnoses     Need for immunization against influenza       Relevant Orders   Flu Vaccine QUAD High Dose(Fluad) (Completed)   Dry skin       Relevant Orders   TSH       Meds ordered this encounter  Medications   celecoxib (CELEBREX) 200 MG capsule    Sig: Take 1 capsule (200 mg total) by mouth 2 (two) times daily.    Dispense:  60 capsule    Refill:  6   losartan (COZAAR) 25 MG tablet    Sig: Take 1 tablet (25 mg total) by mouth daily.    Dispense:  90 tablet    Refill:  1   FLUoxetine (PROZAC) 20 MG capsule    Sig: Take 1 capsule (20 mg  total) by mouth daily.    Dispense:  30 capsule    Refill:  5    Follow-up: Return in about 6 months (around 01/10/2022) for Hypertension, Diabetes follow-up.    Beatrice Lecher, MD

## 2021-07-13 NOTE — Assessment & Plan Note (Signed)
Doing well on zolpidem 5 mg daily.  She has a couple more refills and then will be due for new prescription.

## 2021-07-20 DIAGNOSIS — L853 Xerosis cutis: Secondary | ICD-10-CM | POA: Diagnosis not present

## 2021-07-20 DIAGNOSIS — E119 Type 2 diabetes mellitus without complications: Secondary | ICD-10-CM | POA: Diagnosis not present

## 2021-07-20 DIAGNOSIS — I10 Essential (primary) hypertension: Secondary | ICD-10-CM | POA: Diagnosis not present

## 2021-07-21 LAB — CMP14+EGFR
ALT: 9 IU/L (ref 0–32)
AST: 12 IU/L (ref 0–40)
Albumin/Globulin Ratio: 2.1 (ref 1.2–2.2)
Albumin: 4.4 g/dL (ref 3.7–4.7)
Alkaline Phosphatase: 38 IU/L — ABNORMAL LOW (ref 44–121)
BUN/Creatinine Ratio: 25 (ref 12–28)
BUN: 16 mg/dL (ref 8–27)
Bilirubin Total: 0.8 mg/dL (ref 0.0–1.2)
CO2: 24 mmol/L (ref 20–29)
Calcium: 9.4 mg/dL (ref 8.7–10.3)
Chloride: 101 mmol/L (ref 96–106)
Creatinine, Ser: 0.64 mg/dL (ref 0.57–1.00)
Globulin, Total: 2.1 g/dL (ref 1.5–4.5)
Glucose: 92 mg/dL (ref 65–99)
Potassium: 5.1 mmol/L (ref 3.5–5.2)
Sodium: 139 mmol/L (ref 134–144)
Total Protein: 6.5 g/dL (ref 6.0–8.5)
eGFR: 93 mL/min/{1.73_m2} (ref >59)

## 2021-07-21 LAB — LIPID PANEL
Chol/HDL Ratio: 2.3 ratio (ref 0.0–4.4)
Cholesterol, Total: 190 mg/dL (ref 100–199)
HDL: 83 mg/dL (ref >39)
LDL Chol Calc (NIH): 96 mg/dL (ref 0–99)
Triglycerides: 61 mg/dL (ref 0–149)
VLDL Cholesterol Cal: 11 mg/dL (ref 5–40)

## 2021-07-21 LAB — CBC
Hematocrit: 38.8 % (ref 34.0–46.6)
Hemoglobin: 13.1 g/dL (ref 11.1–15.9)
MCH: 31.2 pg (ref 26.6–33.0)
MCHC: 33.8 g/dL (ref 31.5–35.7)
MCV: 92 fL (ref 79–97)
Platelets: 201 10*3/uL (ref 150–450)
RBC: 4.2 x10E6/uL (ref 3.77–5.28)
RDW: 12.8 % (ref 11.7–15.4)
WBC: 5.5 10*3/uL (ref 3.4–10.8)

## 2021-07-21 LAB — TSH: TSH: 0.982 u[IU]/mL (ref 0.450–4.500)

## 2021-07-21 NOTE — Progress Notes (Signed)
Your lab work is within acceptable range and there are no concerning findings.   ?

## 2021-09-15 ENCOUNTER — Other Ambulatory Visit: Payer: Self-pay | Admitting: Family Medicine

## 2021-09-16 ENCOUNTER — Other Ambulatory Visit (HOSPITAL_COMMUNITY)
Admission: RE | Admit: 2021-09-16 | Discharge: 2021-09-16 | Disposition: A | Discharge status: Home / Self Care | Payer: Medicare Other | Source: Ambulatory Visit | Attending: Family Medicine | Admitting: Family Medicine

## 2021-09-16 ENCOUNTER — Ambulatory Visit (INDEPENDENT_AMBULATORY_CARE_PROVIDER_SITE_OTHER): Payer: Medicare Other | Admitting: Family Medicine

## 2021-09-16 ENCOUNTER — Encounter: Payer: Self-pay | Admitting: Family Medicine

## 2021-09-16 VITALS — BP 137/61 | HR 65 | Ht 62.0 in | Wt 150.0 lb

## 2021-09-16 DIAGNOSIS — Z124 Encounter for screening for malignant neoplasm of cervix: Secondary | ICD-10-CM

## 2021-09-16 DIAGNOSIS — Z1151 Encounter for screening for human papillomavirus (HPV): Secondary | ICD-10-CM | POA: Insufficient documentation

## 2021-09-16 DIAGNOSIS — R238 Other skin changes: Secondary | ICD-10-CM | POA: Diagnosis not present

## 2021-09-16 DIAGNOSIS — N939 Abnormal uterine and vaginal bleeding, unspecified: Secondary | ICD-10-CM | POA: Diagnosis not present

## 2021-09-16 DIAGNOSIS — R319 Hematuria, unspecified: Secondary | ICD-10-CM

## 2021-09-16 LAB — POCT URINALYSIS DIP (CLINITEK)
Bilirubin, UA: NEGATIVE
Glucose, UA: NEGATIVE mg/dL
Ketones, POC UA: NEGATIVE mg/dL
Leukocytes, UA: NEGATIVE
Nitrite, UA: NEGATIVE
POC PROTEIN,UA: NEGATIVE
Spec Grav, UA: 1.01 (ref 1.010–1.025)
Urobilinogen, UA: 0.2 E.U./dL
pH, UA: 6.5 (ref 5.0–8.0)

## 2021-09-16 NOTE — Progress Notes (Signed)
Acute Office Visit  Subjective:    Patient ID: Jasmine Fitzgerald, female    DOB: 05-23-1946, 75 y.o.   MRN: [id number]  Chief Complaint  Patient presents with   Vaginal Bleeding    ? Vaginal bleeding x 1 day. She reports that she noticed it yesterday in her pj bottoms she went to the bathroom and wiped and the blood was bright red. She did wipe her rectum to see if this is where the blood may have been coming from but it was not. She also said that she had some irritation along the crease of her R inner thigh. She hasn't noticed anymore since     HPI Patient is in today for ? Vaginal bleeding x 1 day. She reports that she noticed it yesterday in her pj bottoms she went to the bathroom and wiped and the blood was bright red. She did wipe her rectum to see if this is where the blood may have been coming from but it was not. She notices some stringy clots as well. By midday it seemed to hav estopped.  No dysuria or fever or sweats.  She also said that she had some irritation along the crease of her R inner thigh. She hasn't noticed anymore since then.   Past Medical History:  Diagnosis Date   Carpal tunnel syndrome    Colon polyp    Kidney stones    Renal cyst    benign    Past Surgical History:  Procedure Laterality Date   ABDOMINAL SURGERY  12/16/2016   Pancreaticoduodenal aortic aneurysm repair   BREAST REDUCTION SURGERY  2013   [place]   KIDNEY STONE SURGERY  2015   KNEE ARTHROSCOPY     TONSILLECTOMY  1957   TRIGGER FINGER RELEASE  07/2015   UPPER GASTROINTESTINAL ENDOSCOPY      Family History  Problem Relation Age of Onset   Heart failure Mother    Heart failure Father    Heart attack Father    Diabetes Brother    Kidney failure Brother    Skin cancer Brother        severe   Stroke Maternal Grandmother    Cancer Maternal Aunt    Cancer Maternal Uncle    Ovarian cancer Other    Lung cancer Other    Throat cancer Other     Social History    Socioeconomic History   Marital status: Married    Spouse name: Patrick Jupiter   Number of children: 3   Years of education: 12   Highest education level: Not on file  Occupational History   Occupation: Retired  Tobacco Use   Smoking status: Never   Smokeless tobacco: Never  Substance and Sexual Activity   Alcohol use: No   Drug use: No   Sexual activity: Not Currently  Other Topics Concern   Not on file  Social History Narrative   Retired. High school degree. Married to [place]. 3 adult children. Currently lives with her spouse.   Social Determinants of Health   Financial Resource Strain: Not on file  Food Insecurity: Not on file  Transportation Needs: Not on file  Physical Activity: Not on file  Stress: Not on file  Social Connections: Not on file  Intimate Partner Violence: Not on file    Outpatient Medications Prior to Visit  Medication Sig Dispense Refill   ALPRAZolam (XANAX) 0.25 MG tablet Take 1 tablet (0.25 mg total) by mouth at bedtime as  needed for sleep. 30 tablet 1   AMBULATORY NON FORMULARY MEDICATION Medication Name: Test strips.  Check glucose 2 times per week. #100.  Dx: Diabetes 1 vial 0   aspirin 81 MG tablet Take by mouth.     blood glucose meter kit and supplies KIT Dispense based on patient and insurance preference. Use up to once daily as directed. (FOR ICD-9 250.00, 250.01). 1 each PRN   celecoxib (CELEBREX) 200 MG capsule Take 1 capsule (200 mg total) by mouth 2 (two) times daily. 60 capsule 6   Cholecalciferol (VITAMIN D3) 25 MCG (1000 UT) CAPS Take 2 capsules by mouth daily.     clobetasol (TEMOVATE) 0.05 % external solution Apply to scalp once or twice daily when needed for itching.  5   FLUoxetine (PROZAC) 20 MG capsule Take 1 capsule (20 mg total) by mouth daily. 30 capsule 5   losartan (COZAAR) 25 MG tablet Take 1 tablet (25 mg total) by mouth daily. 90 tablet 1   RESTASIS 0.05 % ophthalmic emulsion 1 drop 2 (two) times daily.     triamcinolone cream  (KENALOG) 0.1 % Apply to irritated & itchy areas of the body once or twice daily when needed. May mix in equal parts with moisturizer. Not to face.  3   TRUE METRIX BLOOD GLUCOSE TEST test strip USE UP TO ONCE DAILY AS DIRECTED 50 each 3   Turmeric (CURCUMIN 95 PO) Take 3 tablets by mouth daily.     zinc gluconate 50 MG tablet Take 50 mg by mouth daily.     zolpidem (AMBIEN) 5 MG tablet Take 1 tablet (5 mg total) by mouth at bedtime as needed for sleep. 30 tablet 2   No facility-administered medications prior to visit.    Allergies  Allergen Reactions   Other Itching    Steri strips at time of 12/2016 surg   Tape Itching    Steri strips at time of 12/2016 surg   Codeine Nausea And Vomiting and Other (See Comments)    GI Upset   Hydrocodone Nausea And Vomiting    Review of Systems     Objective:    Physical Exam Vitals reviewed. Exam conducted with a chaperone present.  Constitutional:      Appearance: She is well-developed.  HENT:     Head: Normocephalic and atraumatic.  Eyes:     Conjunctiva/sclera: Conjunctivae normal.  Cardiovascular:     Rate and Rhythm: Normal rate.  Pulmonary:     Effort: Pulmonary effort is normal.  Genitourinary:    Exam position: Supine.     Pubic Area: Rash present.     Labia:        Right: No rash.        Left: No rash.      Cervix: Friability present.     Uterus: Tender.      Adnexa:        Right: No fullness.         Left: No fullness.       Comments: Along the right groin crease she has some erythema and irritation.  No moisture maceration.  No papules.  He does have multiple Varicosities inside the vaginal vault.  No active bleeding.  No lesions. Skin:    General: Skin is dry.     Coloration: Skin is not pale.  Neurological:     Mental Status: She is alert and oriented to person, place, and time.  Psychiatric:  Behavior: Behavior normal.    BP 137/61   Pulse 65   Ht '5\' 2"'  (1.575 m)   Wt 150 lb (68 kg)   SpO2 100%   BMI  27.44 kg/m  Wt Readings from Last 3 Encounters:  09/16/21 150 lb (68 kg)  07/13/21 147 lb (66.7 kg)  03/01/21 145 lb (65.8 kg)    Health Maintenance Due  Topic Date Due   COVID-19 Vaccine (4 - Booster for Moderna series) 11/14/2020    There are no preventive care reminders to display for this patient.   Lab Results  Component Value Date   TSH 0.982 07/20/2021   Lab Results  Component Value Date   WBC 5.5 07/20/2021   HGB 13.1 07/20/2021   HCT 38.8 07/20/2021   MCV 92 07/20/2021   PLT 201 07/20/2021   Lab Results  Component Value Date   NA 139 07/20/2021   K 5.1 07/20/2021   CO2 24 07/20/2021   GLUCOSE 92 07/20/2021   BUN 16 07/20/2021   CREATININE 0.64 07/20/2021   BILITOT 0.8 07/20/2021   ALKPHOS 38 (L) 07/20/2021   AST 12 07/20/2021   ALT 9 07/20/2021   PROT 6.5 07/20/2021   ALBUMIN 4.4 07/20/2021   CALCIUM 9.4 07/20/2021   EGFR 93 07/20/2021   Lab Results  Component Value Date   CHOL 190 07/20/2021   Lab Results  Component Value Date   HDL 83 07/20/2021   Lab Results  Component Value Date   LDLCALC 96 07/20/2021   Lab Results  Component Value Date   TRIG 61 07/20/2021   Lab Results  Component Value Date   CHOLHDL 2.3 07/20/2021   Lab Results  Component Value Date   HGBA1C 5.7 (A) 07/13/2021       Assessment & Plan:   Problem List Items Addressed This Visit   None Visit Diagnoses     Hematuria, unspecified type    -  Primary   Relevant Orders   POCT URINALYSIS DIP (CLINITEK) (Completed)   Urinalysis, microscopic only   Vagina bleeding       Relevant Orders   US Pelvic Complete With Transvaginal   Skin irritation       Screening for cervical cancer       Relevant Orders   Cytology - PAP      Most likely vaginal bleeding-did pelvic exam today which revealed 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  But I do think we should  rule out postmenopausal uterine bleeding as well so we will schedule for ultrasound for further evaluation.  We did go ahead and do a Pap smear and test for bacteria and yeast as well we will call with those results once available.  She also noticed a little bit of what looked like clear liquid with the bleeding so we also did a urinalysis to check for hematuria it did show some trace blood so we will send for microscopic review to look for whole red blood cells and call with those results once available.  She is not been having any UTI symptoms otherwise.  She would prefer to do imaging after Thanksgiving if possible she is driving to see her brother who was just placed on hospice.  Skin irritation-right now it looks more like the underwear may have just really rubbed the area it does not look like a discrete rash per se.  But  if its not improving then please let me know.  Okay to use a barrier ointment or cream or even hydrocortisone.  No orders of the defined types were placed in this encounter.    Beatrice Lecher, MD

## 2021-09-17 LAB — URINALYSIS, MICROSCOPIC ONLY
Bacteria, UA: NONE SEEN /HPF
Hyaline Cast: NONE SEEN /LPF
RBC / HPF: NONE SEEN /HPF (ref 0–2)
Squamous Epithelial / HPF: NONE SEEN /HPF (ref <=5)
WBC, UA: NONE SEEN /HPF (ref 0–5)

## 2021-09-17 NOTE — Progress Notes (Signed)
Great news!  No whole red blood cells in the urine microscopic review.

## 2021-09-20 ENCOUNTER — Encounter: Payer: Self-pay | Admitting: Family Medicine

## 2021-09-21 ENCOUNTER — Other Ambulatory Visit: Payer: Self-pay | Admitting: Family Medicine

## 2021-09-22 LAB — CYTOLOGY - PAP
Comment: NEGATIVE
Diagnosis: NEGATIVE
High risk HPV: NEGATIVE

## 2021-09-27 NOTE — Progress Notes (Signed)
Hi Jasmine Fitzgerald, your Pap smear is normal which is very reassuring.  Negative for HPV.

## 2021-09-30 ENCOUNTER — Ambulatory Visit (INDEPENDENT_AMBULATORY_CARE_PROVIDER_SITE_OTHER): Payer: Medicare Other

## 2021-09-30 ENCOUNTER — Other Ambulatory Visit: Payer: Self-pay

## 2021-09-30 ENCOUNTER — Encounter: Payer: Self-pay | Admitting: Family Medicine

## 2021-09-30 DIAGNOSIS — N950 Postmenopausal bleeding: Secondary | ICD-10-CM | POA: Diagnosis not present

## 2021-09-30 DIAGNOSIS — Z780 Asymptomatic menopausal state: Secondary | ICD-10-CM | POA: Diagnosis not present

## 2021-09-30 DIAGNOSIS — N83201 Unspecified ovarian cyst, right side: Secondary | ICD-10-CM | POA: Diagnosis not present

## 2021-09-30 DIAGNOSIS — N939 Abnormal uterine and vaginal bleeding, unspecified: Secondary | ICD-10-CM | POA: Diagnosis not present

## 2021-09-30 DIAGNOSIS — N854 Malposition of uterus: Secondary | ICD-10-CM | POA: Diagnosis not present

## 2021-09-30 IMAGING — US US PELVIS COMPLETE WITH TRANSVAGINAL
1 series · 13 of 25 positions shown · non-contrast
Comparison: None

CLINICAL DATA: Abnormal vaginal bleeding, postmenopausal

EXAM:
TRANSABDOMINAL AND TRANSVAGINAL ULTRASOUND OF PELVIS
TECHNIQUE: Both transabdominal and transvaginal ultrasound examinations of the
pelvis were performed. Transabdominal technique was performed for
global imaging of the pelvis including uterus, ovaries, adnexal
regions, and pelvic cul-de-sac. It was necessary to proceed with
endovaginal exam following the transabdominal exam to visualize the
endometrium and ovaries.

[Series 1: us pelvic complete with transvaginal · 88 acquisitions, 13 frames shown]
[im 1/88]
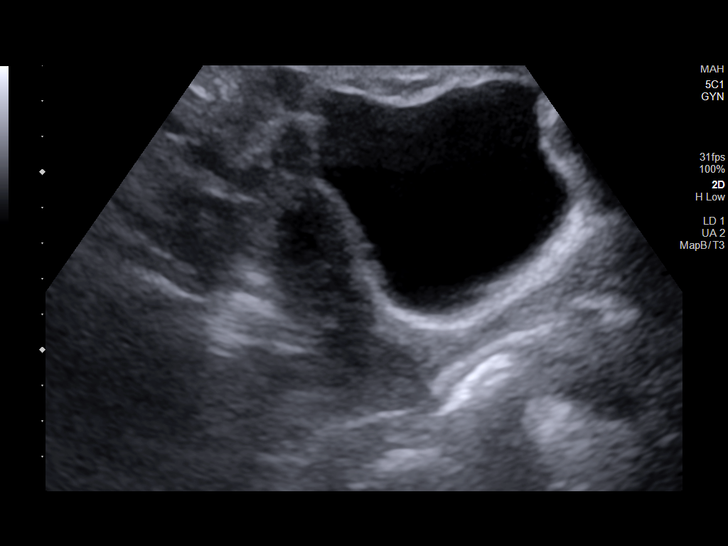
[im 8/88]
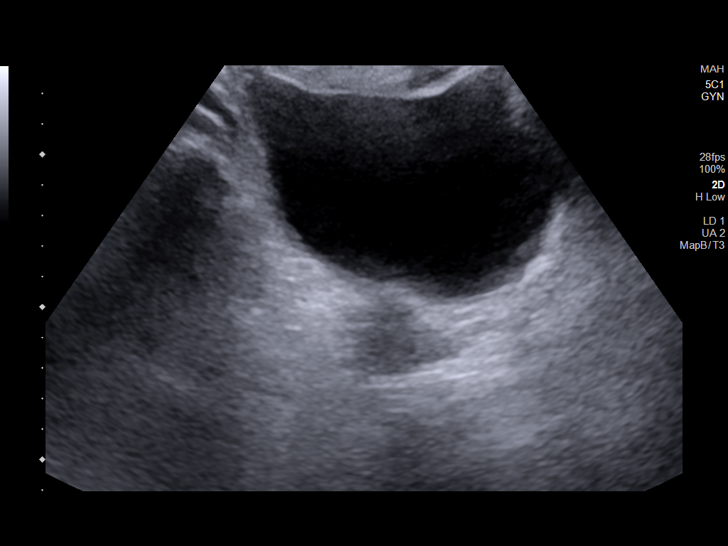
[im 15/88]
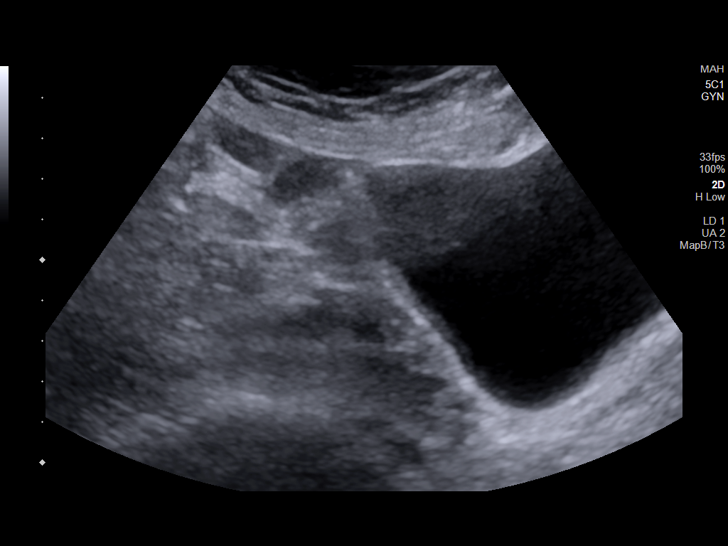
[im 22/88]
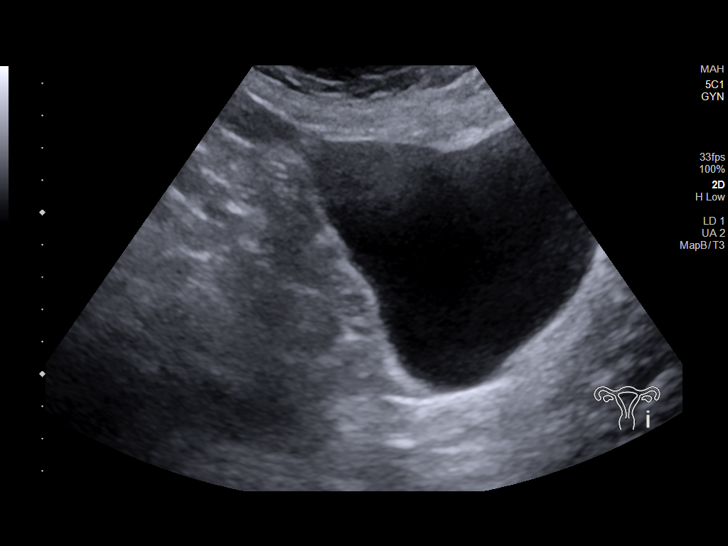
[im 30/88]
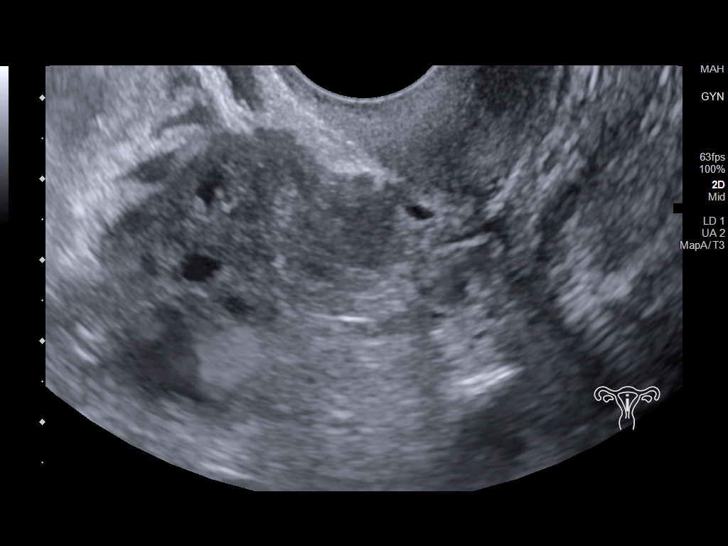
[im 37/88]
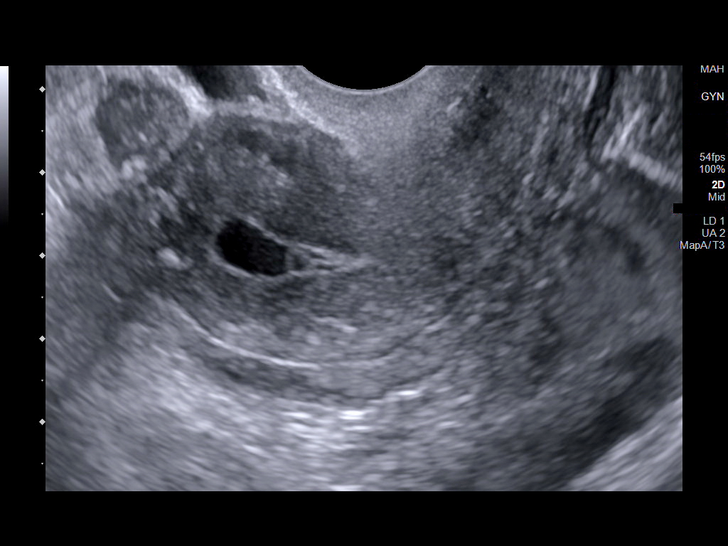
[im 44/88]
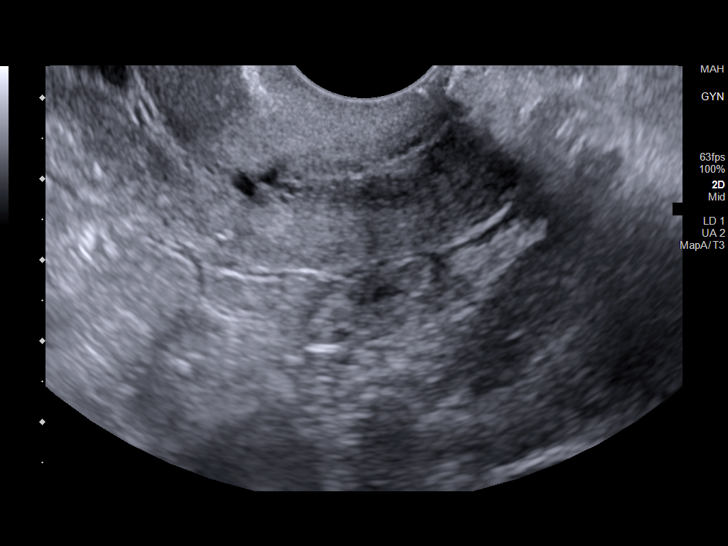
[im 51/88]
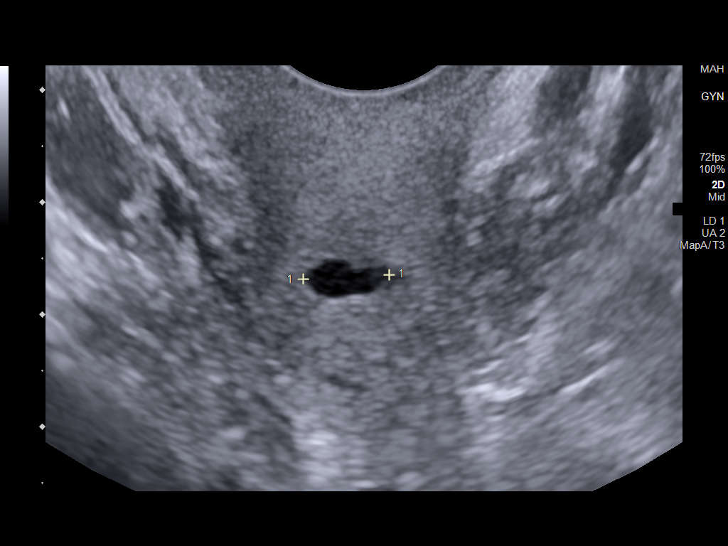
[im 59/88]
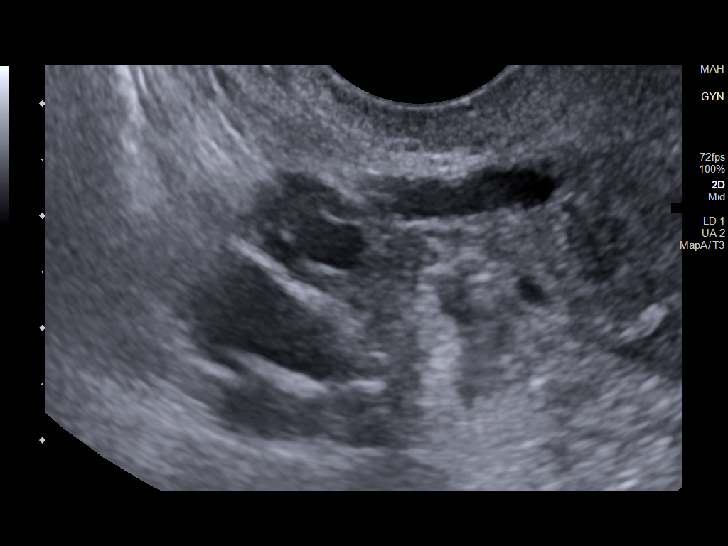
[im 66/88]
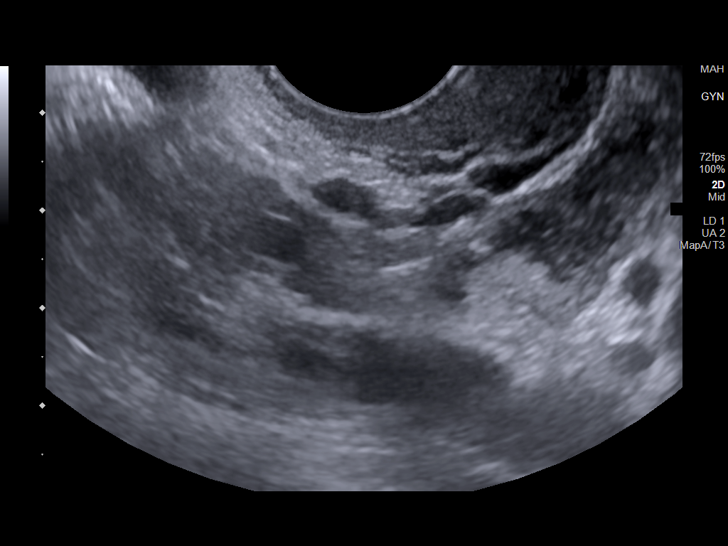
[im 73/88]
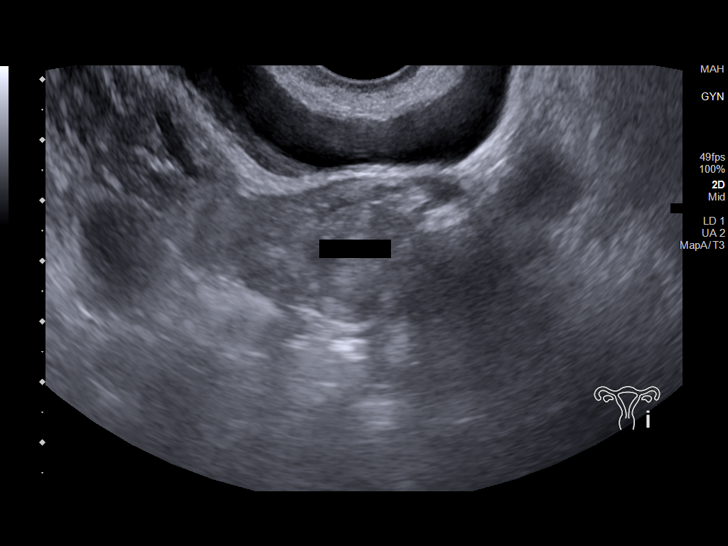
[im 80/88]
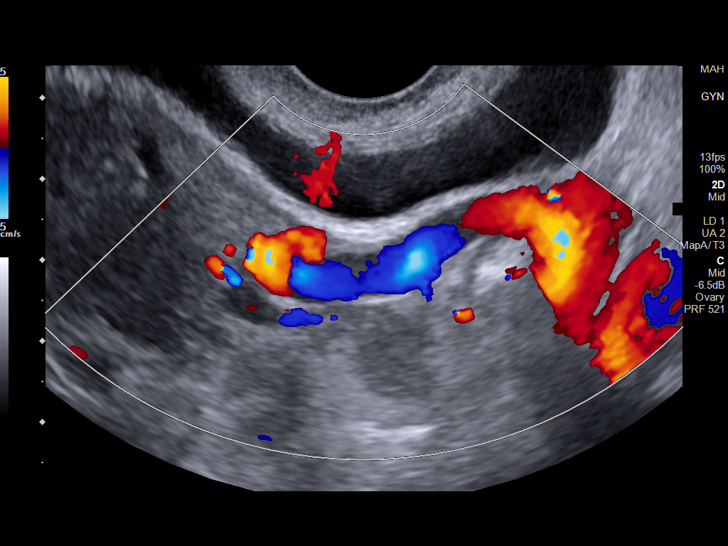
[im 88/88]
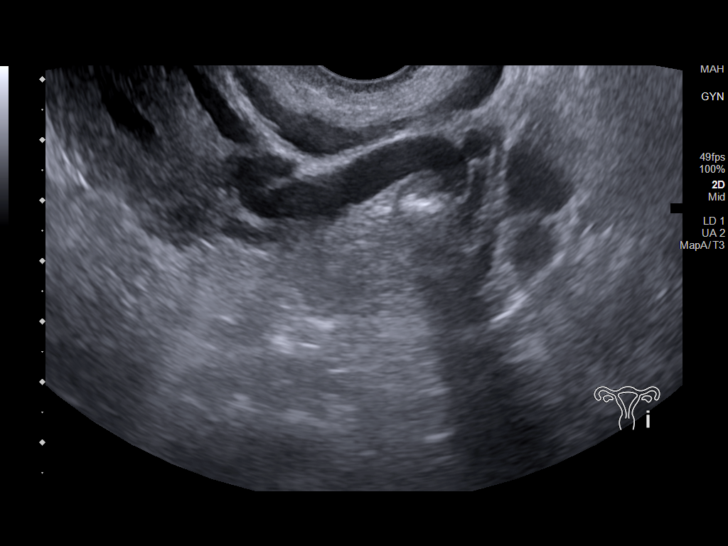

[13 of 25 positions shown; findings below may reference images not displayed]

FINDINGS: Uterus

Measurements: 6.6 x 2.6 x 4.4 cm = volume: 40 mL. Anteverted.
Heterogeneous myometrium. No discrete mass

Endometrium

Thickness: 2 mm. Endometrial fluid, nonspecific. Small endometrial
cyst or subendometrial cyst mid uterus 6 x 4 x 8 mm. Potential soft
tissue nodule question polyp or tumor at upper to mid uterus 7 x 4 x
7 mm.

Right ovary

Measurements: 1.9 x 1.1 x 1.3 cm = volume: 1.3 mL. 7 mm cyst; No
followup imaging recommended Note: This recommendation does not
apply to premenarchal patients or to those with increased risk
(genetic, family history, elevated tumor markers or other high-risk
factors) of ovarian cancer. Reference: Radiology [DATE]):[id number].

Left ovary

Not visualized, likely obscured by bowel

Other findings

No free pelvic fluid.  No adnexal masses.
IMPRESSION: Nonspecific endometrial fluid with question 7 mm endometrial polyp
versus mass; in the setting of post-menopausal bleeding, endometrial
sampling is indicated to exclude carcinoma. If results are benign,
sonohysterogram should be considered for focal lesion work-up prior
to hysteroscopy. (Ref: Radiological Reasoning: Algorithmic Workup of
Abnormal Vaginal Bleeding with Endovaginal Sonography and
Sonohysterography. AJR [GZ]; [id number])

No additional worrisome findings.

Nonvisualization of LEFT ovary.

## 2021-09-30 NOTE — Progress Notes (Signed)
Hi Jasmine Fitzgerald, ultrasound shows a possible polyp versus mass that is pretty small about 7 mm with some extra fluid around it inside the uterus.  Recommendation is for endometrial sampling or biopsy this can usually be done in the office in just under a few minutes.  I would like to refer you to a GYN to have this done.  I am happy to schedule you with our GYN down the hall or if you have someone you have seen before and would prefer to see let me know and I am happy to make a referral.

## 2021-10-01 ENCOUNTER — Other Ambulatory Visit: Payer: Self-pay | Admitting: *Deleted

## 2021-10-01 DIAGNOSIS — N950 Postmenopausal bleeding: Secondary | ICD-10-CM

## 2021-10-11 ENCOUNTER — Ambulatory Visit (INDEPENDENT_AMBULATORY_CARE_PROVIDER_SITE_OTHER): Payer: Medicare Other | Admitting: Obstetrics & Gynecology

## 2021-10-11 ENCOUNTER — Encounter: Payer: Self-pay | Admitting: Obstetrics & Gynecology

## 2021-10-11 ENCOUNTER — Other Ambulatory Visit (HOSPITAL_COMMUNITY)
Admission: RE | Admit: 2021-10-11 | Discharge: 2021-10-11 | Disposition: A | Discharge status: Home / Self Care | Payer: Medicare Other | Source: Ambulatory Visit | Attending: Obstetrics & Gynecology | Admitting: Obstetrics & Gynecology

## 2021-10-11 ENCOUNTER — Other Ambulatory Visit: Payer: Self-pay

## 2021-10-11 VITALS — BP 137/73 | HR 67 | Resp 16 | Ht 62.0 in | Wt 149.0 lb

## 2021-10-11 DIAGNOSIS — N950 Postmenopausal bleeding: Secondary | ICD-10-CM | POA: Insufficient documentation

## 2021-10-11 DIAGNOSIS — N858 Other specified noninflammatory disorders of uterus: Secondary | ICD-10-CM | POA: Diagnosis not present

## 2021-10-11 NOTE — Progress Notes (Signed)
Subjective:    Patient ID: Jasmine Fitzgerald, female    DOB: 1946-05-19, 75 y.o.   MRN: [id number]  HPI 75 yo female with PMB in Nov 2022.  Pt was on HRT many years ago (in her 84s).  Patient has not had any other vaginal bleeding other than this episode described in Dr. Fay Records note.  Patient did have some cramping after her bimanual exam and ultrasound.  Otherwise she does not have any pelvic pain.  Patient has some family history of breast cancer in a distant relative with ovarian cancer.  She is getting details of these diagnoses  There is not a strong family history of colon or endometrial cancer.    TECHNIQUE: Both transabdominal and transvaginal ultrasound examinations of the pelvis were performed. Transabdominal technique was performed for global imaging of the pelvis including uterus, ovaries, adnexal regions, and pelvic cul-de-sac. It was necessary to proceed with endovaginal exam following the transabdominal exam to visualize the endometrium and ovaries.   COMPARISON:  None   FINDINGS: Uterus   Measurements: 6.6 x 2.6 x 4.4 cm = volume: 40 mL. Anteverted. Heterogeneous myometrium. No discrete mass   Endometrium   Thickness: 2 mm. Endometrial fluid, nonspecific. Small endometrial cyst or subendometrial cyst mid uterus 6 x 4 x 8 mm. Potential soft tissue nodule question polyp or tumor at upper to mid uterus 7 x 4 x 7 mm.   Right ovary   Measurements: 1.9 x 1.1 x 1.3 cm = volume: 1.3 mL. 7 mm cyst; No followup imaging recommended Note: This recommendation does not apply to premenarchal patients or to those with increased risk (genetic, family history, elevated tumor markers or other high-risk factors) of ovarian cancer. Reference: Radiology 2019 Nov; 293(2):359-371.   Left ovary   Not visualized, likely obscured by bowel   Other findings   No free pelvic fluid.  No adnexal masses.   IMPRESSION: Nonspecific endometrial fluid with question 7 mm endometrial  polyp versus mass; in the setting of post-menopausal bleeding, endometrial sampling is indicated to exclude carcinoma. If results are benign, sonohysterogram should be considered for focal lesion work-up prior to hysteroscopy. (Ref: Radiological Reasoning: Algorithmic Workup of Abnormal Vaginal Bleeding with Endovaginal Sonography and Sonohysterography. AJR 2008; 270:[id number]) Review of Systems  Constitutional: Negative.   Respiratory: Negative.    Cardiovascular: Negative.   Gastrointestinal: Negative.   Genitourinary:  Positive for vaginal bleeding. Negative for vaginal discharge.       Mild right-sided pelvic pain after the ultrasound and exam by PCP.      Objective:   Physical Exam Vitals reviewed.  Constitutional:      General: She is not in acute distress.    Appearance: She is well-developed.  HENT:     Head: Normocephalic and atraumatic.  Eyes:     Conjunctiva/sclera: Conjunctivae normal.  Cardiovascular:     Rate and Rhythm: Normal rate.  Pulmonary:     Effort: Pulmonary effort is normal.  Genitourinary:    Comments: Tanner V Vulva:  No lesion Vagina:  Atrohpc, pale pink, no discharge or lesion Cervix:  No CMT Uterus:  Non tender, mobile Right adnexa--mildly tender on deep palpation near ASIS, no mass Left adnexa--non tender, no mass Skin:    General: Skin is warm and dry.  Neurological:     Mental Status: She is alert and oriented to person, place, and time.  Psychiatric:        Mood and Affect: Mood normal.   Vitals:   10/11/21  1044  BP: 137/73  Pulse: 67  Resp: 16  Weight: 149 lb (67.6 kg)  Height: 5\' 2"  (1.575 m)      Assessment & Plan:  75 year old female with new postmenopausal bleeding in November 2021.  Ultrasound results as above.  After discussion with the patient, we have decided to proceed with endometrial biopsy today.  ENDOMETRIAL BIOPSY     The indications for endometrial biopsy were reviewed.   Risks of the biopsy including cramping,  bleeding, infection, uterine perforation, inadequate specimen and need for additional procedures  were discussed. The patient states she understands and agrees to undergo procedure today. Consent was signed. Time out was performed. Urine HCG was negative. A sterile speculum was placed in the patient's vagina and the cervix was prepped with Betadine. A single-toothed tenaculum was placed on the anterior lip of the cervix to stabilize it. The 3 mm pipelle was introduced into the endometrial cavity without difficulty to a depth of 7 cm, and a small amount of tissue was obtained and sent to pathology  Two passes total were completed. The instruments were removed from the patient's vagina. Minimal bleeding from the cervix was noted. The patient tolerated the procedure well. Routine post-procedure instructions were given to the patient. The patient will follow up to review the results and for further management.

## 2021-10-13 LAB — SURGICAL PATHOLOGY

## 2021-10-14 ENCOUNTER — Other Ambulatory Visit: Payer: Self-pay | Admitting: *Deleted

## 2021-10-14 DIAGNOSIS — N9489 Other specified conditions associated with female genital organs and menstrual cycle: Secondary | ICD-10-CM

## 2021-10-14 NOTE — Progress Notes (Signed)
Orders placed for Sonohysterogram per VO Dr Gala Romney

## 2021-10-15 ENCOUNTER — Encounter: Payer: Self-pay | Admitting: Family Medicine

## 2021-10-15 ENCOUNTER — Ambulatory Visit (INDEPENDENT_AMBULATORY_CARE_PROVIDER_SITE_OTHER): Payer: Medicare Other | Admitting: Sports Medicine

## 2021-10-15 DIAGNOSIS — Z780 Asymptomatic menopausal state: Secondary | ICD-10-CM

## 2021-10-15 DIAGNOSIS — Z0000 Encounter for general adult medical examination without abnormal findings: Secondary | ICD-10-CM

## 2021-10-15 DIAGNOSIS — K2100 Gastro-esophageal reflux disease with esophagitis, without bleeding: Secondary | ICD-10-CM

## 2021-10-15 NOTE — Patient Instructions (Addendum)
[facility] Maintenance Summary and Written Plan of Care  Jasmine Fitzgerald ,  Thank you for allowing me to perform your Medicare Annual Wellness Visit and for your ongoing commitment to your health.   Health Maintenance & Immunization History Health Maintenance  Topic Date Due   COVID-19 Vaccine (4 - Booster for Moderna series) 10/31/2021 (Originally 11/14/2020)   OPHTHALMOLOGY EXAM  07/13/2022 (Originally 02/16/2022)   HEMOGLOBIN A1C  01/10/2022   MAMMOGRAM  04/26/2022   FOOT EXAM  07/13/2022   TETANUS/TDAP  05/06/2029   COLONOSCOPY (Pts 45-6yrs Insurance coverage will need to be confirmed)  01/05/2030   Pneumonia Vaccine 65+ Years old  Completed   INFLUENZA VACCINE  Completed   DEXA SCAN  Completed   Hepatitis C Screening  Completed   Zoster Vaccines- Shingrix  Completed   HPV VACCINES  Aged Out   Immunization History  Administered Date(s) Administered   Fluad Quad(high Dose 65+) 07/09/2020, 07/13/2021   Influenza Split 07/02/2015   Influenza, High Dose Seasonal PF 07/10/2017, 08/07/2018, 08/03/2019   Influenza, Seasonal, Injecte, Preservative Fre 08/08/2013, 07/28/2014   Influenza,inj,Quad PF,6+ Mos 08/08/2013, 07/02/2015, 07/18/2016   Influenza,inj,quad, With Preservative 07/28/2014   Moderna Sars-Covid-2 Vaccination 11/25/2019, 12/23/2019, 09/19/2020   Pneumococcal Conjugate-13 02/02/2015   Pneumococcal Polysaccharide-23 10/31/2012   Tdap 09/11/2008, 10/31/2008, 05/07/2019   Zoster Recombinat (Shingrix) 03/14/2017, 01/19/2018   Zoster, Live 05/06/2009, 10/31/2012    These are the patient goals that we discussed:  Goals Addressed               This Visit's Progress     Patient Stated (pt-stated)        10/15/2021 AWV Goal: Improved Nutrition/Diet  Patient will verbalize understanding that diet plays an important role in overall health and that a poor diet is a risk factor for many chronic medical conditions.  Over the next year, patient  will improve self management of their diet by incorporating more water and making better eating choices. Patient will utilize available community resources to help with food acquisition if needed (ex: food pantries, [address], etc) Patient will work with nutrition specialist if a referral was made          This is a list of Health Maintenance Items that are overdue or due now: Bone densitometry screening  Orders/Referrals Placed Today: Orders Placed This Encounter  Procedures   DEXAScan    Standing Status:   Future    Standing Expiration Date:   10/15/2022    Scheduling Instructions:     Please call patient to schedule.    Order Specific Question:   Reason for exam:    Answer:   Post Menopausal    Order Specific Question:   Preferred imaging location?    Answer:   [facility]    (Contact our referral department at [phone] if you have not spoken with someone about your referral appointment within the next 5 days)    Follow-up Plan       Health Maintenance, Female Adopting a healthy lifestyle and getting preventive care are important in promoting health and wellness. Ask your health care provider about: The right schedule for you to have regular tests and exams. Things you can do on your own to prevent diseases and keep yourself healthy. What should I know about diet, weight, and exercise? Eat a healthy diet  Eat a diet that includes plenty of vegetables, fruits, low-fat dairy products, and lean protein. Do not eat a lot of foods  that are high in solid fats, added sugars, or sodium. Maintain a healthy weight Body mass index (BMI) is used to identify weight problems. It estimates body fat based on height and weight. Your health care provider can help determine your BMI and help you achieve or maintain a healthy weight. Get regular exercise Get regular exercise. This is one of the most important things you can do for your health. Most adults should: Exercise  for at least 150 minutes each week. The exercise should increase your heart rate and make you sweat (moderate-intensity exercise). Do strengthening exercises at least twice a week. This is in addition to the moderate-intensity exercise. Spend less time sitting. Even light physical activity can be beneficial. Watch cholesterol and blood lipids Have your blood tested for lipids and cholesterol at 74 years of age, then have this test every 5 years. Have your cholesterol levels checked more often if: Your lipid or cholesterol levels are high. You are older than 74 years of age. You are at high risk for heart disease. What should I know about cancer screening? Depending on your health history and family history, you may need to have cancer screening at various ages. This may include screening for: Breast cancer. Cervical cancer. Colorectal cancer. Skin cancer. Lung cancer. What should I know about heart disease, diabetes, and high blood pressure? Blood pressure and heart disease High blood pressure causes heart disease and increases the risk of stroke. This is more likely to develop in people who have high blood pressure readings or are overweight. Have your blood pressure checked: Every 3-5 years if you are 71-88 years of age. Every year if you are 25 years old or older. Diabetes Have regular diabetes screenings. This checks your fasting blood sugar level. Have the screening done: Once every three years after age 28 if you are at a normal weight and have a low risk for diabetes. More often and at a younger age if you are overweight or have a high risk for diabetes. What should I know about preventing infection? Hepatitis B If you have a higher risk for hepatitis B, you should be screened for this virus. Talk with your health care provider to find out if you are at risk for hepatitis B infection. Hepatitis C Testing is recommended for: Everyone born from [birth year] through 1964. Anyone with  known risk factors for hepatitis C. Sexually transmitted infections (STIs) Get screened for STIs, including gonorrhea and chlamydia, if: You are sexually active and are younger than 74 years of age. You are older than 74 years of age and your health care provider tells you that you are at risk for this type of infection. Your sexual activity has changed since you were last screened, and you are at increased risk for chlamydia or gonorrhea. Ask your health care provider if you are at risk. Ask your health care provider about whether you are at high risk for HIV. Your health care provider may recommend a prescription medicine to help prevent HIV infection. If you choose to take medicine to prevent HIV, you should first get tested for HIV. You should then be tested every 3 months for as long as you are taking the medicine. Pregnancy If you are about to stop having your period (premenopausal) and you may become pregnant, seek counseling before you get pregnant. Take 400 to 800 micrograms (mcg) of folic acid every day if you become pregnant. Ask for birth control (contraception) if you want to prevent pregnancy. Osteoporosis and  menopause Osteoporosis is a disease in which the bones lose minerals and strength with aging. This can result in bone fractures. If you are 67 years old or older, or if you are at risk for osteoporosis and fractures, ask your health care provider if you should: Be screened for bone loss. Take a calcium or vitamin D supplement to lower your risk of fractures. Be given hormone replacement therapy (HRT) to treat symptoms of menopause. Follow these instructions at home: Alcohol use Do not drink alcohol if: Your health care provider tells you not to drink. You are pregnant, may be pregnant, or are planning to become pregnant. If you drink alcohol: Limit how much you have to: 0-1 drink a day. Know how much alcohol is in your drink. In the [place] one drink equals one 12 oz bottle  of beer (355 mL), one 5 oz glass of wine (148 mL), or one 1 oz glass of hard liquor (44 mL). Lifestyle Do not use any products that contain nicotine or tobacco. These products include cigarettes, chewing tobacco, and vaping devices, such as e-cigarettes. If you need help quitting, ask your health care provider. Do not use street drugs. Do not share needles. Ask your health care provider for help if you need support or information about quitting drugs. General instructions Schedule regular health, dental, and eye exams. Stay current with your vaccines. Tell your health care provider if: You often feel depressed. You have ever been abused or do not feel safe at home. Summary Adopting a healthy lifestyle and getting preventive care are important in promoting health and wellness. Follow your health care provider's instructions about healthy diet, exercising, and getting tested or screened for diseases. Follow your health care provider's instructions on monitoring your cholesterol and blood pressure. This information is not intended to replace advice given to you by your health care provider. Make sure you discuss any questions you have with your health care provider. Document Revised: 03/08/2021 Document Reviewed: 03/08/2021 Elsevier Patient Education  [place].

## 2021-10-15 NOTE — Progress Notes (Signed)
MEDICARE ANNUAL WELLNESS VISIT  10/15/2021  Telephone Visit Disclaimer This Medicare AWV was conducted by telephone due to national recommendations for restrictions regarding the COVID-19 Pandemic (e.g. social distancing).  I verified, using two identifiers, that I am speaking with Jasmine Fitzgerald or their authorized healthcare agent. I discussed the limitations, risks, security, and privacy concerns of performing an evaluation and management service by telephone and the potential availability of an in-person appointment in the future. The patient expressed understanding and agreed to proceed.  Location of Patient: Home Location of Provider (nurse):  In the office.  Subjective:    Jasmine Fitzgerald is a 75 y.o. female patient of Metheney, Rene Kocher, MD who had a Medicare Annual Wellness Visit today via telephone. Jasmine Fitzgerald is Retired and lives with their spouse. she has 3 children. she reports that she is socially active and does interact with friends/family regularly. she is moderately physically active and enjoys yardwork and decorating.  Patient Care Team: Hali Marry, MD as PCP - General (Family Medicine) Dinah Beers, MD as Referring Physician (Vascular Surgery) Darlis Loan, MD as Referring Physician (Gastroenterology) Artist Pais, MD (Orthopedic Surgery)  Advanced Directives 10/15/2021 03/27/2018 05/31/2016 02/15/2016  Does Patient Have a Medical Advance Directive? Yes Yes Yes Yes  Type of Advance Directive Living will;Healthcare Power of Why;Living will [place];Living will [place];Living will;Out of facility DNR (pink MOST or yellow form)  Does patient want to make changes to medical advance directive? No - Patient declined - - No - Patient declined  Copy of [place] in Chart? No - copy requested No - copy requested No - copy requested No - copy requested    Hospital  Utilization Over the Past 12 Months: # of hospitalizations or ER visits: 0 # of surgeries: 0  Review of Systems    Patient reports that her overall health is unchanged compared to last year.  History obtained from chart review and the patient  Patient Reported Readings (BP, Pulse, CBG, Weight, etc) none  Pain Assessment Pain : No/denies pain     Current Medications & Allergies (verified) Allergies as of 10/15/2021       Reactions   Other Itching   Steri strips at time of 12/2016 surg   Tape Itching   Steri strips at time of 12/2016 surg   Codeine Nausea And Vomiting, Other (See Comments)   GI Upset   Hydrocodone Nausea And Vomiting        Medication List        Accurate as of October 15, 2021  2:29 PM. If you have any questions, ask your nurse or doctor.          ALPRAZolam 0.25 MG tablet Commonly known as: XANAX Take 1 tablet (0.25 mg total) by mouth at bedtime as needed for sleep.   AMBULATORY NON FORMULARY MEDICATION Medication Name: Test strips.  Check glucose 2 times per week. #100.  Dx: Diabetes   aspirin 81 MG tablet Take by mouth.   blood glucose meter kit and supplies Kit Dispense based on patient and insurance preference. Use up to once daily as directed. (FOR ICD-9 250.00, 250.01).   celecoxib 200 MG capsule Commonly known as: CELEBREX Take 1 capsule (200 mg total) by mouth 2 (two) times daily.   clobetasol 0.05 % external solution Commonly known as: TEMOVATE Apply to scalp once or twice daily when needed for itching.  CURCUMIN 95 PO Take 3 tablets by mouth daily.   cyanocobalamin 100 MCG tablet Take 100 mcg by mouth daily.   FLUoxetine 20 MG capsule Commonly known as: PROZAC Take 1 capsule (20 mg total) by mouth daily.   losartan 25 MG tablet Commonly known as: COZAAR Take 1 tablet (25 mg total) by mouth daily.   Restasis 0.05 % ophthalmic emulsion Generic drug: cycloSPORINE 1 drop 2 (two) times daily.   triamcinolone cream  0.1 % Commonly known as: KENALOG Apply to irritated & itchy areas of the body once or twice daily when needed. May mix in equal parts with moisturizer. Not to face.   True Metrix Blood Glucose Test test strip Generic drug: glucose blood USE UP TO ONCE DAILY AS DIRECTED   Vitamin D3 25 MCG (1000 UT) Caps Take 2 capsules by mouth daily.   zinc gluconate 50 MG tablet Take 50 mg by mouth daily.   zolpidem 5 MG tablet Commonly known as: AMBIEN Take 1 tablet (5 mg total) by mouth at bedtime as needed for sleep.        History (reviewed): Past Medical History:  Diagnosis Date   Carpal tunnel syndrome    Colon polyp    Hypertension    Kidney stones    Renal cyst    benign   Past Surgical History:  Procedure Laterality Date   ABDOMINAL SURGERY  12/16/2016   Pancreaticoduodenal aortic aneurysm repair   BREAST REDUCTION SURGERY  2013   [facility]   KIDNEY STONE SURGERY  2015   KNEE ARTHROSCOPY     TONSILLECTOMY  1957   TRIGGER FINGER RELEASE  07/2015   UPPER GASTROINTESTINAL ENDOSCOPY     Family History  Problem Relation Age of Onset   Heart failure Mother    Heart failure Father    Heart attack Father    Diabetes Brother    Kidney failure Brother    Skin cancer Brother        severe   Stroke Maternal Grandmother    Cancer Maternal Aunt    Cancer Maternal Uncle    Ovarian cancer Other    Lung cancer Other    Throat cancer Other    Breast cancer Cousin    Social History   Socioeconomic History   Marital status: Married    Spouse name: Patrick Jupiter   Number of children: 3   Years of education: 12   Highest education level: 12th grade  Occupational History   Occupation: Retired  Tobacco Use   Smoking status: Never   Smokeless tobacco: Never  Vaping Use   Vaping Use: Never used  Substance and Sexual Activity   Alcohol use: Not Currently    Comment: occassionally   Drug use: No   Sexual activity: Not Currently    Birth control/protection: Surgical   Other Topics Concern   Not on file  Social History Narrative   Retired. High school degree. Married to [place]. 3 adult children. Currently lives with her spouse. She enjoys yardwork, decorate and making flower arrangement.   Social Determinants of Health   Financial Resource Strain: Low Risk    Difficulty of Paying Living Expenses: Not hard at all  Food Insecurity: No Food Insecurity   Worried About Charity fundraiser in the Last Year: Never true   [place] in the Last Year: Never true  Transportation Needs: No Transportation Needs   Lack of Transportation (Medical): No   Lack of Transportation (  Non-Medical): No  Physical Activity: Sufficiently Active   Days of Exercise per Week: 3 days   Minutes of Exercise per Session: 60 min  Stress: No Stress Concern Present   Feeling of Stress : Not at all  Social Connections: Socially Integrated   Frequency of Communication with Friends and Family: More than three times a week   Frequency of Social Gatherings with Friends and Family: Twice a week   Attends Religious Services: More than 4 times per year   Active Member of [employer] or Organizations: Yes   Attends Archivist Meetings: More than 4 times per year   Marital Status: Married    Activities of Daily Living In your present state of health, do you have any difficulty performing the following activities: 10/15/2021  Hearing? Y  Comment wears hearing aids in both ears.  Vision? N  Difficulty concentrating or making decisions? N  Walking or climbing stairs? N  Dressing or bathing? N  Doing errands, shopping? N  Preparing Food and eating ? N  Using the Toilet? N  In the past six months, have you accidently leaked urine? N  Comment urine urgency  Do you have problems with loss of bowel control? N  Managing your Medications? N  Managing your Finances? N  Housekeeping or managing your Housekeeping? N  Some recent data might be hidden    Patient Education/  Literacy How often do you need to have someone help you when you read instructions, pamphlets, or other written materials from your doctor or pharmacy?: 1 - Never What is the last grade level you completed in school?: 12th grade  Exercise Current Exercise Habits: Structured exercise class, Type of exercise: strength training/weights;Other - see comments (bike), Time (Minutes): 60, Frequency (Times/Week): 3, Weekly Exercise (Minutes/Week): 180, Intensity: Moderate, Exercise limited by: None identified  Diet Patient reports consuming  2-3  meals a day and 1 snack(s) a day Patient reports that her primary diet is: Regular Patient reports that she does have regular access to food.   Depression Screen PHQ 2/9 Scores 10/15/2021 07/13/2021 03/01/2021 09/29/2020 07/09/2020 07/09/2020 01/07/2020  PHQ - 2 Score 0 0 0 5 0 0 0  PHQ- 9 Score - _0 0 - -     Fall Risk Fall Risk  07/13/2021 03/01/2021 09/09/2019 05/31/2019 05/06/2019  Falls in the past year? 1 0 0 (No Data) 0  Comment - - - [facility] Telephone Survey: data to providers prior to load -  Number falls in past yr: 0 0 0 (No Data) -  Comment - - - Emmi Telephone Survey Actual Response =  -  Injury with Fall? 0 0 0 - -  Risk for fall due to : No Fall Risks No Fall Risks - - -  Follow up Falls prevention discussed;Falls evaluation completed Falls evaluation completed - - -     Objective:  Jasmine Fitzgerald seemed alert and oriented and she participated appropriately during our telephone visit.  Blood Pressure Weight BMI  BP Readings from Last 3 Encounters:  10/11/21 137/73  09/16/21 137/61  07/13/21 (!) 129/53   Wt Readings from Last 3 Encounters:  10/11/21 149 lb (67.6 kg)  09/16/21 150 lb (68 kg)  07/13/21 147 lb (66.7 kg)   BMI Readings from Last 1 Encounters:  10/11/21 27.25 kg/m    *Unable to obtain current vital signs, weight, and BMI due to telephone visit type  Hearing/Vision  Jasmine Fitzgerald did not seem to have difficulty with  hearing/understanding during the telephone conversation Reports that she has had a formal eye exam by an eye care professional within the past year Reports that she has had a formal hearing evaluation within the past year *Unable to fully assess hearing and vision during telephone visit type  Cognitive Function: 6CIT Screen 10/15/2021 03/27/2018 03/14/2017  What Year? 0 points 0 points 0 points  What month? 0 points 0 points 0 points  What time? 0 points 0 points 0 points  Count back from 20 0 points 0 points 0 points  Months in reverse 0 points 0 points 0 points  Repeat phrase 0 points 0 points 0 points  Total Score 0 0 0   (Normal:0-7, Significant for Dysfunction: >8)  Normal Cognitive Function Screening: Yes   Immunization & Health Maintenance Record Immunization History  Administered Date(s) Administered   Fluad Quad(high Dose 65+) 07/09/2020, 07/13/2021   Influenza Split 07/02/2015   Influenza, High Dose Seasonal PF 07/10/2017, 08/07/2018, 08/03/2019   Influenza, Seasonal, Injecte, Preservative Fre 08/08/2013, 07/28/2014   Influenza,inj,Quad PF,6+ Mos 08/08/2013, 07/02/2015, 07/18/2016   Influenza,inj,quad, With Preservative 07/28/2014   Moderna Sars-Covid-2 Vaccination 11/25/2019, 12/23/2019, 09/19/2020   Pneumococcal Conjugate-13 02/02/2015   Pneumococcal Polysaccharide-23 10/31/2012   Tdap 09/11/2008, 10/31/2008, 05/07/2019   Zoster Recombinat (Shingrix) 03/14/2017, 01/19/2018   Zoster, Live 05/06/2009, 10/31/2012    Health Maintenance  Topic Date Due   COVID-19 Vaccine (4 - Booster for Moderna series) 10/31/2021 (Originally 11/14/2020)   OPHTHALMOLOGY EXAM  07/13/2022 (Originally 02/16/2022)   HEMOGLOBIN A1C  01/10/2022   MAMMOGRAM  04/26/2022   FOOT EXAM  07/13/2022   TETANUS/TDAP  05/06/2029   COLONOSCOPY (Pts 45-67yr Insurance coverage will need to be confirmed)  01/05/2030   Pneumonia Vaccine 75 Years old  Completed   INFLUENZA VACCINE  Completed   DEXA SCAN   Completed   Hepatitis C Screening  Completed   Zoster Vaccines- Shingrix  Completed   HPV VACCINES  Aged Out       Assessment  This is a routine wellness examination for Jasmine Fitzgerald.  Health Maintenance: Due or Overdue There are no preventive care reminders to display for this patient.   Jasmine Fitzgerald does not need a referral for Community Assistance: Care Management:   no Social Work:    no Prescription Assistance:  no Nutrition/Diabetes Education:  no   Plan:  Personalized Goals  Goals Addressed               This Visit's Progress     Patient Stated (pt-stated)        10/15/2021 AWV Goal: Improved Nutrition/Diet  Patient will verbalize understanding that diet plays an important role in overall health and that a poor diet is a risk factor for many chronic medical conditions.  Over the next year, patient will improve self management of their diet by incorporating more water and making better eating choices. Patient will utilize available community resources to help with food acquisition if needed (ex: food pantries, [address], etc) Patient will work with nutrition specialist if a referral was made        Personalized Health Maintenance & Screening Recommendations  Bone densitometry screening  Lung Cancer Screening Recommended: no (Low Dose CT Chest recommended if Age 74-80years, 30 pack-year currently smoking OR have quit w/in past 15 years) Hepatitis C Screening recommended: no HIV Screening recommended: no  Advanced Directives: Written information was not prepared per patient's request.  Referrals & Orders Orders Placed This Encounter  Procedures   DEXAScan     Follow-up Plan Follow-up with Hali Marry, MD as planned Referral for bone density has been sent and they will call you to schedule.  Medicare wellness visit in one year.  AVS printed and mailed to the patient.    I have personally reviewed and noted the following in the patients  chart:   Medical and social history Use of alcohol, tobacco or illicit drugs  Current medications and supplements Functional ability and status Nutritional status Physical activity Advanced directives List of other physicians Hospitalizations, surgeries, and ER visits in previous 12 months Vitals Screenings to include cognitive, depression, and falls Referrals and appointments  In addition, I have reviewed and discussed with Jasmine R Salado certain preventive protocols, quality metrics, and best practice recommendations. A written personalized care plan for preventive services as well as general preventive health recommendations is available and can be mailed to the patient at her request.      Tinnie Gens, RN  10/15/2021

## 2021-10-18 ENCOUNTER — Other Ambulatory Visit: Payer: Self-pay

## 2021-10-18 MED ORDER — LANSOPRAZOLE 30 MG PO TBDD: 30.0000 mg | DELAYED_RELEASE_TABLET | Freq: Every day | ORAL | 3 refills | Status: DC

## 2021-10-18 NOTE — Progress Notes (Signed)
Spoke with Cherlynn Kaiser at [facility] and cancelled [employer].  Charyl Bigger, CMA

## 2021-10-18 NOTE — Telephone Encounter (Signed)
[place] for order. vPls notify Jesly when done.    Sorry, I placed order under Jasmine Fitzgerald's chart for the prevacid. Can you call to cancel that.  Thak you

## 2021-10-27 ENCOUNTER — Ambulatory Visit (INDEPENDENT_AMBULATORY_CARE_PROVIDER_SITE_OTHER): Payer: Medicare Other

## 2021-10-27 ENCOUNTER — Other Ambulatory Visit: Payer: Self-pay

## 2021-10-27 DIAGNOSIS — Z0000 Encounter for general adult medical examination without abnormal findings: Secondary | ICD-10-CM

## 2021-10-27 DIAGNOSIS — Z780 Asymptomatic menopausal state: Secondary | ICD-10-CM

## 2021-10-27 DIAGNOSIS — M85852 Other specified disorders of bone density and structure, left thigh: Secondary | ICD-10-CM | POA: Diagnosis not present

## 2021-10-29 NOTE — Progress Notes (Signed)
Hi Melisse, your bone density shows a T score of -1.7 which is consistent with Osteopenia.    The current recommendation for osteopenia (mildly thin bones) treatment includes:   #1 calcium-total of 1200 mg of calcium daily.  If you eat a very calcium rich diet you may be able to obtain that without a supplement.  If not, then I recommend calcium 500 mg twice a day.  There are several products over-the-counter such as Caltrate D and Viactiv chews which are great options that contain calcium and vitamin D. #2 vitamin D-recommend 800 international units daily. #3 exercise-recommend 30 minutes of weightbearing exercise 3 days a week.  Resistance training ,such as doing bands and light weights, can be particularly helpful.

## 2021-11-09 DIAGNOSIS — J302 Other seasonal allergic rhinitis: Secondary | ICD-10-CM | POA: Diagnosis not present

## 2021-11-09 DIAGNOSIS — L299 Pruritus, unspecified: Secondary | ICD-10-CM | POA: Diagnosis not present

## 2021-11-09 DIAGNOSIS — L239 Allergic contact dermatitis, unspecified cause: Secondary | ICD-10-CM | POA: Diagnosis not present

## 2021-11-09 DIAGNOSIS — L209 Atopic dermatitis, unspecified: Secondary | ICD-10-CM | POA: Diagnosis not present

## 2021-11-11 ENCOUNTER — Other Ambulatory Visit: Payer: Self-pay | Admitting: Family Medicine

## 2021-11-11 ENCOUNTER — Ambulatory Visit
Admission: RE | Admit: 2021-11-11 | Discharge: 2021-11-11 | Disposition: A | Discharge status: Home / Self Care | Payer: Medicare Other | Source: Ambulatory Visit | Attending: Obstetrics & Gynecology | Admitting: Obstetrics & Gynecology

## 2021-11-11 DIAGNOSIS — N9489 Other specified conditions associated with female genital organs and menstrual cycle: Secondary | ICD-10-CM

## 2021-11-11 DIAGNOSIS — N950 Postmenopausal bleeding: Secondary | ICD-10-CM | POA: Diagnosis not present

## 2021-11-17 ENCOUNTER — Other Ambulatory Visit: Payer: Self-pay | Admitting: Obstetrics & Gynecology

## 2021-11-19 ENCOUNTER — Other Ambulatory Visit: Payer: Self-pay | Admitting: Family Medicine

## 2021-12-01 ENCOUNTER — Encounter: Payer: Self-pay | Admitting: Family Medicine

## 2021-12-02 NOTE — Telephone Encounter (Signed)
OK, let he know we will see what we can do. Will have a Education officer, museum from our system reach out as well. I am concerned they are billing hours of service and not showing up.

## 2021-12-24 DIAGNOSIS — R202 Paresthesia of skin: Secondary | ICD-10-CM | POA: Diagnosis not present

## 2021-12-24 DIAGNOSIS — L209 Atopic dermatitis, unspecified: Secondary | ICD-10-CM | POA: Diagnosis not present

## 2021-12-24 DIAGNOSIS — L570 Actinic keratosis: Secondary | ICD-10-CM | POA: Diagnosis not present

## 2021-12-24 DIAGNOSIS — Z85828 Personal history of other malignant neoplasm of skin: Secondary | ICD-10-CM | POA: Diagnosis not present

## 2021-12-24 DIAGNOSIS — D1801 Hemangioma of skin and subcutaneous tissue: Secondary | ICD-10-CM | POA: Diagnosis not present

## 2021-12-24 DIAGNOSIS — L814 Other melanin hyperpigmentation: Secondary | ICD-10-CM | POA: Diagnosis not present

## 2021-12-24 DIAGNOSIS — L821 Other seborrheic keratosis: Secondary | ICD-10-CM | POA: Diagnosis not present

## 2022-01-10 ENCOUNTER — Other Ambulatory Visit: Payer: Self-pay

## 2022-01-10 ENCOUNTER — Encounter: Payer: Self-pay | Admitting: Family Medicine

## 2022-01-10 ENCOUNTER — Ambulatory Visit (INDEPENDENT_AMBULATORY_CARE_PROVIDER_SITE_OTHER): Payer: Medicare Other | Admitting: Family Medicine

## 2022-01-10 VITALS — BP 127/62 | HR 70 | Resp 16 | Ht 62.0 in | Wt 155.0 lb

## 2022-01-10 DIAGNOSIS — I10 Essential (primary) hypertension: Secondary | ICD-10-CM

## 2022-01-10 DIAGNOSIS — E119 Type 2 diabetes mellitus without complications: Secondary | ICD-10-CM

## 2022-01-10 DIAGNOSIS — G479 Sleep disorder, unspecified: Secondary | ICD-10-CM

## 2022-01-10 DIAGNOSIS — F5101 Primary insomnia: Secondary | ICD-10-CM

## 2022-01-10 LAB — POCT GLYCOSYLATED HEMOGLOBIN (HGB A1C): Hemoglobin A1C: 5.8 % — AB (ref 4.0–5.6)

## 2022-01-10 MED ORDER — ZOLPIDEM TARTRATE 5 MG PO TABS: 5.0000 mg | ORAL_TABLET | Freq: Every evening | ORAL | 1 refills | Status: DC | PRN

## 2022-01-10 MED ORDER — TRUE METRIX BLOOD GLUCOSE TEST VI STRP: ORAL_STRIP | 3 refills | Status: AC

## 2022-01-10 MED ORDER — LOSARTAN POTASSIUM 25 MG PO TABS: 25.0000 mg | ORAL_TABLET | Freq: Every day | ORAL | 3 refills | Status: DC

## 2022-01-10 MED ORDER — FLUOXETINE HCL 10 MG PO CAPS: 10.0000 mg | ORAL_CAPSULE | Freq: Every day | ORAL | 1 refills | Status: DC

## 2022-01-10 NOTE — Progress Notes (Signed)
Established Patient Office Visit  Subjective:  Patient ID: Jasmine Fitzgerald, female    DOB: 05/24/1946  Age: 76 y.o. MRN: [id number]  CC:  Chief Complaint  Patient presents with   Hypertension    Follow up    Diabetes    Follow up    Discuss Medications    Patient would like to discuss if she should stay on Prozac or if it is time to wean off of it.     HPI Jasmine Fitzgerald presents for   Hypertension- Pt denies chest pain, SOB, dizziness, or heart palpitations.  Taking meds as directed w/o problems.  Denies medication side effects.    Diabetes - no hypoglycemic events. No wounds or sores that are not healing well. No increased thirst or urination. Checking glucose at home. Taking medications as prescribed without any side effects.  Mood-she feels that she is at a place where she might be able to start tapering back off of the fluoxetine she has been on it for a little more than a year.  It was started in the fall 2021.  Follow-up insomnia-she does well with the Ambien in general she takes it every night but occasionally will still wake up within 2 to 3 hours after taking it.  Questing refill today.    Past Medical History:  Diagnosis Date   Carpal tunnel syndrome    Colon polyp    Hypertension    Kidney stones    Renal cyst    benign    Past Surgical History:  Procedure Laterality Date   ABDOMINAL SURGERY  12/16/2016   Pancreaticoduodenal aortic aneurysm repair   BREAST REDUCTION SURGERY  2013   HERNIA REPAIR  1996   KIDNEY STONE SURGERY  2015   KNEE ARTHROSCOPY     TONSILLECTOMY  1957   TRIGGER FINGER RELEASE  07/2015   UPPER GASTROINTESTINAL ENDOSCOPY      Family History  Problem Relation Age of Onset   Heart failure Mother    Heart failure Father    Heart attack Father    Diabetes Brother    Kidney failure Brother    Skin cancer Brother        severe   Stroke Maternal Grandmother    Cancer Maternal Aunt    Cancer Maternal Uncle    Ovarian cancer Other     Lung cancer Other    Throat cancer Other    Breast cancer Cousin     Social History   Socioeconomic History   Marital status: Married    Spouse name: Deniece Portela   Number of children: 3   Years of education: 12   Highest education level: 12th grade  Occupational History   Occupation: Retired  Tobacco Use   Smoking status: Never   Smokeless tobacco: Never  Vaping Use   Vaping Use: Never used  Substance and Sexual Activity   Alcohol use: Not Currently    Comment: occassionally   Drug use: No   Sexual activity: Not Currently    Birth control/protection: Surgical  Other Topics Concern   Not on file  Social History Narrative   Retired. High school degree. Married to [place]. 3 adult children. Currently lives with her spouse. She enjoys yardwork, decorate and making flower arrangement.   Social Determinants of Health   Financial Resource Strain: Low Risk    Difficulty of Paying Living Expenses: Not hard at all  Food Insecurity: No Food Insecurity   Worried About Running Out of  Food in the Last Year: Never true   Ran Out of Food in the Last Year: Never true  Transportation Needs: No Transportation Needs   Lack of Transportation (Medical): No   Lack of Transportation (Non-Medical): No  Physical Activity: Sufficiently Active   Days of Exercise per Week: 3 days   Minutes of Exercise per Session: 60 min  Stress: No Stress Concern Present   Feeling of Stress : Not at all  Social Connections: Socially Integrated   Frequency of Communication with Friends and Family: More than three times a week   Frequency of Social Gatherings with Friends and Family: Twice a week   Attends Religious Services: More than 4 times per year   Active Member of [employer] or Organizations: Yes   Attends Engineer, structural: More than 4 times per year   Marital Status: Married  Catering manager Violence: Not At Risk   Fear of Current or Ex-Partner: No   Emotionally Abused: No   Physically Abused: No    Sexually Abused: No    Outpatient Medications Prior to Visit  Medication Sig Dispense Refill   ALPRAZolam (XANAX) 0.25 MG tablet Take 1 tablet (0.25 mg total) by mouth daily as needed for anxiety. Do not mix with Ambien 30 tablet 1   AMBULATORY NON FORMULARY MEDICATION Medication Name: Test strips.  Check glucose 2 times per week. #100.  Dx: Diabetes 1 vial 0   aspirin 81 MG tablet Take by mouth.     blood glucose meter kit and supplies KIT Dispense based on patient and insurance preference. Use up to once daily as directed. (FOR ICD-9 250.00, 250.01). 1 each PRN   celecoxib (CELEBREX) 200 MG capsule Take 1 capsule (200 mg total) by mouth 2 (two) times daily. 60 capsule 6   Cholecalciferol (VITAMIN D3) 25 MCG (1000 UT) CAPS Take 2 capsules by mouth daily.     clobetasol (TEMOVATE) 0.05 % external solution Apply to scalp once or twice daily when needed for itching.  5   cyanocobalamin 100 MCG tablet Take 100 mcg by mouth daily.     RESTASIS 0.05 % ophthalmic emulsion 1 drop 2 (two) times daily.     triamcinolone cream (KENALOG) 0.1 % Apply to irritated & itchy areas of the body once or twice daily when needed. May mix in equal parts with moisturizer. Not to face.  3   Turmeric (CURCUMIN 95 PO) Take 3 tablets by mouth daily.     zinc gluconate 50 MG tablet Take 50 mg by mouth daily.     FLUoxetine (PROZAC) 20 MG capsule Take 1 capsule (20 mg total) by mouth daily. 30 capsule 5   losartan (COZAAR) 25 MG tablet Take 1 tablet (25 mg total) by mouth daily. 90 tablet 1   TRUE METRIX BLOOD GLUCOSE TEST test strip USE UP TO ONCE DAILY AS DIRECTED 50 each 3   zolpidem (AMBIEN) 5 MG tablet Take 1 tablet (5 mg total) by mouth at bedtime as needed for sleep. 30 tablet 2   No facility-administered medications prior to visit.    Allergies  Allergen Reactions   Other Itching    Steri strips at time of 12/2016 surg   Tape Itching    Steri strips at time of 12/2016 surg   Codeine Nausea And Vomiting  and Other (See Comments)    GI Upset   Hydrocodone Nausea And Vomiting    ROS Review of Systems    Objective:    Physical Exam  Constitutional:      Appearance: Normal appearance. She is well-developed.  HENT:     Head: Normocephalic and atraumatic.  Cardiovascular:     Rate and Rhythm: Normal rate and regular rhythm.     Heart sounds: Normal heart sounds.  Pulmonary:     Effort: Pulmonary effort is normal.     Breath sounds: Normal breath sounds.  Skin:    General: Skin is warm and dry.  Neurological:     Mental Status: She is alert and oriented to person, place, and time.  Psychiatric:        Behavior: Behavior normal.   BP 127/62   Pulse 70   Resp 16   Ht 5\' 2"  (1.575 m)   Wt 155 lb (70.3 kg)   SpO2 96%   BMI 28.35 kg/m  Wt Readings from Last 3 Encounters:  01/10/22 155 lb (70.3 kg)  10/11/21 149 lb (67.6 kg)  09/16/21 150 lb (68 kg)     There are no preventive care reminders to display for this patient.   There are no preventive care reminders to display for this patient.  Lab Results  Component Value Date   TSH 0.982 07/20/2021   Lab Results  Component Value Date   WBC 5.5 07/20/2021   HGB 13.1 07/20/2021   HCT 38.8 07/20/2021   MCV 92 07/20/2021   PLT 201 07/20/2021   Lab Results  Component Value Date   NA 139 07/20/2021   K 5.1 07/20/2021   CO2 24 07/20/2021   GLUCOSE 92 07/20/2021   BUN 16 07/20/2021   CREATININE 0.64 07/20/2021   BILITOT 0.8 07/20/2021   ALKPHOS 38 (L) 07/20/2021   AST 12 07/20/2021   ALT 9 07/20/2021   PROT 6.5 07/20/2021   ALBUMIN 4.4 07/20/2021   CALCIUM 9.4 07/20/2021   EGFR 93 07/20/2021   Lab Results  Component Value Date   CHOL 190 07/20/2021   Lab Results  Component Value Date   HDL 83 07/20/2021   Lab Results  Component Value Date   LDLCALC 96 07/20/2021   Lab Results  Component Value Date   TRIG 61 07/20/2021   Lab Results  Component Value Date   CHOLHDL 2.3 07/20/2021   Lab Results   Component Value Date   HGBA1C 5.8 (A) 01/10/2022      Assessment & Plan:   Problem List Items Addressed This Visit       Cardiovascular and Mediastinum   Essential hypertension, benign    Well controlled. Continue current regimen. Follow up in  6 mo       Relevant Medications   losartan (COZAAR) 25 MG tablet     Endocrine   Controlled type 2 diabetes mellitus without complication, without long-term current use of insulin (HCC) - Primary   Relevant Medications   losartan (COZAAR) 25 MG tablet   glucose blood (TRUE METRIX BLOOD GLUCOSE TEST) test strip   Other Relevant Orders   POCT glycosylated hemoglobin (Hb A1C) (Completed)     Other   Primary insomnia    Filled Ambien today.  Some nights its not as effective but we will continue with current regimen for now.      Disturbance in sleep behavior   Relevant Medications   zolpidem (AMBIEN) 5 MG tablet    Meds ordered this encounter  Medications   losartan (COZAAR) 25 MG tablet    Sig: Take 1 tablet (25 mg total) by mouth daily.    Dispense:  90 tablet    Refill:  3   glucose blood (TRUE METRIX BLOOD GLUCOSE TEST) test strip    Sig: USE UP TO ONCE DAILY AS DIRECTED    Dispense:  50 each    Refill:  3   zolpidem (AMBIEN) 5 MG tablet    Sig: Take 1 tablet (5 mg total) by mouth at bedtime as needed for sleep.    Dispense:  90 tablet    Refill:  1   FLUoxetine (PROZAC) 10 MG capsule    Sig: Take 1 capsule (10 mg total) by mouth daily.    Dispense:  30 capsule    Refill:  1    Follow-up: Return in about 6 months (around 07/13/2022) for Hypertension adn Prediabetes .    Nani Gasser, MD

## 2022-01-10 NOTE — Assessment & Plan Note (Signed)
Filled Ambien today.  Some nights its not as effective but we will continue with current regimen for now. ?

## 2022-01-10 NOTE — Assessment & Plan Note (Signed)
Well controlled. Continue current regimen. Follow up in  6 mo  

## 2022-01-28 ENCOUNTER — Other Ambulatory Visit: Payer: Self-pay | Admitting: Family Medicine

## 2022-02-16 DIAGNOSIS — Z48812 Encounter for surgical aftercare following surgery on the circulatory system: Secondary | ICD-10-CM | POA: Diagnosis not present

## 2022-02-16 DIAGNOSIS — K551 Chronic vascular disorders of intestine: Secondary | ICD-10-CM | POA: Diagnosis not present

## 2022-02-16 DIAGNOSIS — I708 Atherosclerosis of other arteries: Secondary | ICD-10-CM | POA: Diagnosis not present

## 2022-02-22 DIAGNOSIS — H2513 Age-related nuclear cataract, bilateral: Secondary | ICD-10-CM | POA: Diagnosis not present

## 2022-02-22 LAB — HM DIABETES EYE EXAM

## 2022-03-13 ENCOUNTER — Other Ambulatory Visit: Payer: Self-pay | Admitting: Family Medicine

## 2022-04-22 ENCOUNTER — Encounter: Payer: Self-pay | Admitting: Family Medicine

## 2022-05-10 DIAGNOSIS — Z1231 Encounter for screening mammogram for malignant neoplasm of breast: Secondary | ICD-10-CM | POA: Diagnosis not present

## 2022-05-10 LAB — HM MAMMOGRAPHY

## 2022-05-23 DIAGNOSIS — H35372 Puckering of macula, left eye: Secondary | ICD-10-CM | POA: Diagnosis not present

## 2022-05-23 DIAGNOSIS — H527 Unspecified disorder of refraction: Secondary | ICD-10-CM | POA: Diagnosis not present

## 2022-05-23 DIAGNOSIS — H5051 Esophoria: Secondary | ICD-10-CM | POA: Diagnosis not present

## 2022-05-23 DIAGNOSIS — H43813 Vitreous degeneration, bilateral: Secondary | ICD-10-CM | POA: Diagnosis not present

## 2022-05-23 DIAGNOSIS — H40003 Preglaucoma, unspecified, bilateral: Secondary | ICD-10-CM | POA: Diagnosis not present

## 2022-05-23 DIAGNOSIS — H04123 Dry eye syndrome of bilateral lacrimal glands: Secondary | ICD-10-CM | POA: Diagnosis not present

## 2022-05-23 DIAGNOSIS — H25813 Combined forms of age-related cataract, bilateral: Secondary | ICD-10-CM | POA: Diagnosis not present

## 2022-05-23 LAB — HM DIABETES EYE EXAM

## 2022-05-31 HISTORY — PX: CATARACT EXTRACTION W/ INTRAOCULAR LENS IMPLANT: SHX1309

## 2022-06-06 DIAGNOSIS — H25813 Combined forms of age-related cataract, bilateral: Secondary | ICD-10-CM | POA: Diagnosis not present

## 2022-06-09 DIAGNOSIS — E1136 Type 2 diabetes mellitus with diabetic cataract: Secondary | ICD-10-CM | POA: Diagnosis not present

## 2022-06-09 DIAGNOSIS — H40003 Preglaucoma, unspecified, bilateral: Secondary | ICD-10-CM | POA: Diagnosis not present

## 2022-06-09 DIAGNOSIS — K219 Gastro-esophageal reflux disease without esophagitis: Secondary | ICD-10-CM | POA: Diagnosis not present

## 2022-06-09 DIAGNOSIS — M1990 Unspecified osteoarthritis, unspecified site: Secondary | ICD-10-CM | POA: Diagnosis not present

## 2022-06-09 DIAGNOSIS — F419 Anxiety disorder, unspecified: Secondary | ICD-10-CM | POA: Diagnosis not present

## 2022-06-09 DIAGNOSIS — Z961 Presence of intraocular lens: Secondary | ICD-10-CM | POA: Diagnosis not present

## 2022-06-09 DIAGNOSIS — H25813 Combined forms of age-related cataract, bilateral: Secondary | ICD-10-CM | POA: Diagnosis not present

## 2022-06-09 DIAGNOSIS — H25811 Combined forms of age-related cataract, right eye: Secondary | ICD-10-CM | POA: Diagnosis not present

## 2022-06-09 DIAGNOSIS — H04123 Dry eye syndrome of bilateral lacrimal glands: Secondary | ICD-10-CM | POA: Diagnosis not present

## 2022-06-09 DIAGNOSIS — Z79899 Other long term (current) drug therapy: Secondary | ICD-10-CM | POA: Diagnosis not present

## 2022-06-09 DIAGNOSIS — Z885 Allergy status to narcotic agent status: Secondary | ICD-10-CM | POA: Diagnosis not present

## 2022-06-09 DIAGNOSIS — Z7982 Long term (current) use of aspirin: Secondary | ICD-10-CM | POA: Diagnosis not present

## 2022-06-09 DIAGNOSIS — I10 Essential (primary) hypertension: Secondary | ICD-10-CM | POA: Diagnosis not present

## 2022-06-16 DIAGNOSIS — Z7982 Long term (current) use of aspirin: Secondary | ICD-10-CM | POA: Diagnosis not present

## 2022-06-16 DIAGNOSIS — H25812 Combined forms of age-related cataract, left eye: Secondary | ICD-10-CM | POA: Diagnosis not present

## 2022-06-16 DIAGNOSIS — E1136 Type 2 diabetes mellitus with diabetic cataract: Secondary | ICD-10-CM | POA: Diagnosis not present

## 2022-06-16 DIAGNOSIS — H2512 Age-related nuclear cataract, left eye: Secondary | ICD-10-CM | POA: Diagnosis not present

## 2022-06-16 DIAGNOSIS — K219 Gastro-esophageal reflux disease without esophagitis: Secondary | ICD-10-CM | POA: Diagnosis not present

## 2022-06-16 DIAGNOSIS — M1990 Unspecified osteoarthritis, unspecified site: Secondary | ICD-10-CM | POA: Diagnosis not present

## 2022-06-16 DIAGNOSIS — Z79899 Other long term (current) drug therapy: Secondary | ICD-10-CM | POA: Diagnosis not present

## 2022-06-16 DIAGNOSIS — Z961 Presence of intraocular lens: Secondary | ICD-10-CM | POA: Diagnosis not present

## 2022-06-16 DIAGNOSIS — F32A Depression, unspecified: Secondary | ICD-10-CM | POA: Diagnosis not present

## 2022-06-16 DIAGNOSIS — Z885 Allergy status to narcotic agent status: Secondary | ICD-10-CM | POA: Diagnosis not present

## 2022-06-16 DIAGNOSIS — F419 Anxiety disorder, unspecified: Secondary | ICD-10-CM | POA: Diagnosis not present

## 2022-06-16 DIAGNOSIS — H40009 Preglaucoma, unspecified, unspecified eye: Secondary | ICD-10-CM | POA: Diagnosis not present

## 2022-06-16 DIAGNOSIS — H04123 Dry eye syndrome of bilateral lacrimal glands: Secondary | ICD-10-CM | POA: Diagnosis not present

## 2022-06-16 DIAGNOSIS — H25813 Combined forms of age-related cataract, bilateral: Secondary | ICD-10-CM | POA: Diagnosis not present

## 2022-06-16 DIAGNOSIS — I10 Essential (primary) hypertension: Secondary | ICD-10-CM | POA: Diagnosis not present

## 2022-07-01 ENCOUNTER — Encounter: Payer: Self-pay | Admitting: Family Medicine

## 2022-07-01 MED ORDER — FLUOXETINE HCL 20 MG PO CAPS: 20.0000 mg | ORAL_CAPSULE | Freq: Every day | ORAL | 3 refills | Status: DC

## 2022-07-16 ENCOUNTER — Encounter: Payer: Self-pay | Admitting: Family Medicine

## 2022-07-18 ENCOUNTER — Ambulatory Visit (INDEPENDENT_AMBULATORY_CARE_PROVIDER_SITE_OTHER): Payer: Medicare Other | Admitting: Family Medicine

## 2022-07-18 ENCOUNTER — Encounter: Payer: Self-pay | Admitting: Family Medicine

## 2022-07-18 VITALS — BP 137/54 | HR 65

## 2022-07-18 DIAGNOSIS — F418 Other specified anxiety disorders: Secondary | ICD-10-CM | POA: Diagnosis not present

## 2022-07-18 DIAGNOSIS — I728 Aneurysm of other specified arteries: Secondary | ICD-10-CM

## 2022-07-18 DIAGNOSIS — F5101 Primary insomnia: Secondary | ICD-10-CM | POA: Diagnosis not present

## 2022-07-18 DIAGNOSIS — E119 Type 2 diabetes mellitus without complications: Secondary | ICD-10-CM | POA: Diagnosis not present

## 2022-07-18 DIAGNOSIS — I10 Essential (primary) hypertension: Secondary | ICD-10-CM | POA: Diagnosis not present

## 2022-07-18 DIAGNOSIS — G479 Sleep disorder, unspecified: Secondary | ICD-10-CM | POA: Diagnosis not present

## 2022-07-18 DIAGNOSIS — Z23 Encounter for immunization: Secondary | ICD-10-CM | POA: Diagnosis not present

## 2022-07-18 LAB — POCT GLYCOSYLATED HEMOGLOBIN (HGB A1C): Hemoglobin A1C: 5.9 % — AB (ref 4.0–5.6)

## 2022-07-18 MED ORDER — ZOLPIDEM TARTRATE 5 MG PO TABS: 5.0000 mg | ORAL_TABLET | Freq: Every evening | ORAL | 0 refills | Status: DC | PRN

## 2022-07-18 MED ORDER — ALPRAZOLAM 0.25 MG PO TABS: 0.2500 mg | ORAL_TABLET | Freq: Every day | ORAL | 0 refills | Status: DC | PRN

## 2022-07-18 NOTE — Assessment & Plan Note (Signed)
C looks great today at 5.9.  Continue current regimen she is doing great.  Plans on getting back on track with exercise in October.

## 2022-07-18 NOTE — Progress Notes (Signed)
Established Patient Office Visit  Subjective   Patient ID: Jasmine Fitzgerald, female    DOB: 21-Dec-1945  Age: 76 y.o. MRN: [id number]  Chief Complaint  Patient presents with  . Diabetes  . Hypertension    HPI  Hypertension- Pt denies chest pain, SOB, dizziness, or heart palpitations.  Taking meds as directed w/o problems.  Denies medication side effects.    Diabetes - no hypoglycemic events. No wounds or sores that are not healing well. No increased thirst or urination. Checking glucose at home. Taking medications as prescribed without any side effects.  Had bilateral cataract surgery in August.  Surgery went well but she has developed a little scar bit of scar tissue but they cannot treat that until she is 6 months out.  Currently exercising because of recent surgery but plans on joining boomers again and October.  Chronic insomnia-she uses the Ambien as needed she does not use it daily and in fact still has a fair amount of tabs left.  So uses her alprazolam sparingly.    ROS    Objective:     BP (!) 137/54   Pulse 65   SpO2 98%    Physical Exam Vitals and nursing note reviewed.  Constitutional:      Appearance: She is well-developed.  HENT:     Head: Normocephalic and atraumatic.  Cardiovascular:     Rate and Rhythm: Normal rate and regular rhythm.     Heart sounds: Normal heart sounds.  Pulmonary:     Effort: Pulmonary effort is normal.     Breath sounds: Normal breath sounds.  Skin:    General: Skin is warm and dry.  Neurological:     Mental Status: She is alert and oriented to person, place, and time.  Psychiatric:        Behavior: Behavior normal.     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       Essential hypertension, benign - Primary    Well controlled. Continue current regimen. Follow up in  6 mo       Relevant Orders   Comprehensive Metabolic Panel (CMET)   Lipid panel   CMP14+EGFR   CBC     Endocrine   Controlled type 2 diabetes mellitus without complication, without long-term current use of insulin (HCC)    C looks great today at 5.9.  Continue current regimen she is doing great.  Plans on getting back on track with exercise in October.      Relevant Orders   POCT glycosylated hemoglobin (Hb A1C) (Completed)   Comprehensive Metabolic Panel (CMET)   Lipid panel   CMP14+EGFR   CBC     Other   Primary insomnia    The Ambien as needed.  Zolpidem refilled today.      Relevant Medications   zolpidem (AMBIEN)  5 MG tablet   Disturbance in sleep behavior   Relevant Medications   zolpidem (AMBIEN) 5 MG tablet   Depression with anxiety    Uses the alprazolam sparingly we will go ahead and send a refill today.      Relevant Medications   ALPRAZolam (XANAX) 0.25 MG tablet   Other Visit Diagnoses     Need for influenza vaccination       Relevant Orders   Flu Vaccine QUAD High Dose(Fluad) (Completed)      Encouraged her to schedule her repeat mammogram.  Return in about 6 months (around 01/16/2023) for Diabetes follow-up, Hypertension.    Beatrice Lecher, MD

## 2022-07-18 NOTE — Assessment & Plan Note (Signed)
Uses the alprazolam sparingly we will go ahead and send a refill today.

## 2022-07-18 NOTE — Assessment & Plan Note (Signed)
Well controlled. Continue current regimen. Follow up in  6 mo  

## 2022-07-18 NOTE — Assessment & Plan Note (Signed)
Still gets  Bypass evaluated IN April with vascular surgery yearly.

## 2022-07-18 NOTE — Assessment & Plan Note (Signed)
The Ambien as needed.  Zolpidem refilled today.

## 2022-07-26 DIAGNOSIS — I10 Essential (primary) hypertension: Secondary | ICD-10-CM | POA: Diagnosis not present

## 2022-07-26 DIAGNOSIS — E119 Type 2 diabetes mellitus without complications: Secondary | ICD-10-CM | POA: Diagnosis not present

## 2022-07-27 LAB — LIPID PANEL
Chol/HDL Ratio: 2.4 ratio (ref 0.0–4.4)
Cholesterol, Total: 196 mg/dL (ref 100–199)
HDL: 81 mg/dL (ref >39)
LDL Chol Calc (NIH): 105 mg/dL — ABNORMAL HIGH (ref 0–99)
Triglycerides: 55 mg/dL (ref 0–149)
VLDL Cholesterol Cal: 10 mg/dL (ref 5–40)

## 2022-07-27 LAB — CMP14+EGFR
ALT: 9 IU/L (ref 0–32)
AST: 14 IU/L (ref 0–40)
Albumin/Globulin Ratio: 1.9 (ref 1.2–2.2)
Albumin: 4.3 g/dL (ref 3.8–4.8)
Alkaline Phosphatase: 34 IU/L — ABNORMAL LOW (ref 44–121)
BUN/Creatinine Ratio: 25 (ref 12–28)
BUN: 18 mg/dL (ref 8–27)
Bilirubin Total: 0.8 mg/dL (ref 0.0–1.2)
CO2: 25 mmol/L (ref 20–29)
Calcium: 9.1 mg/dL (ref 8.7–10.3)
Chloride: 105 mmol/L (ref 96–106)
Creatinine, Ser: 0.71 mg/dL (ref 0.57–1.00)
Globulin, Total: 2.3 g/dL (ref 1.5–4.5)
Glucose: 93 mg/dL (ref 70–99)
Potassium: 4 mmol/L (ref 3.5–5.2)
Sodium: 141 mmol/L (ref 134–144)
Total Protein: 6.6 g/dL (ref 6.0–8.5)
eGFR: 89 mL/min/{1.73_m2} (ref >59)

## 2022-07-27 LAB — CBC
Hematocrit: 38.2 % (ref 34.0–46.6)
Hemoglobin: 13.1 g/dL (ref 11.1–15.9)
MCH: 31.7 pg (ref 26.6–33.0)
MCHC: 34.3 g/dL (ref 31.5–35.7)
MCV: 93 fL (ref 79–97)
Platelets: 190 10*3/uL (ref 150–450)
RBC: 4.13 x10E6/uL (ref 3.77–5.28)
RDW: 12.3 % (ref 11.7–15.4)
WBC: 5.5 10*3/uL (ref 3.4–10.8)

## 2022-07-27 NOTE — Progress Notes (Signed)
Jasmine Fitzgerald, LDL cholesterol went up just a little bit its looks better over the last couple of years it was 105 this time.  Just encourage you to continue to work on healthy food choices and regular exercise.  Your blood count and metabolic panel look great.  Please let us know where you get your yearly eye exam so that we can get your chart updated.

## 2022-09-09 ENCOUNTER — Other Ambulatory Visit: Payer: Self-pay | Admitting: Family Medicine

## 2022-10-05 ENCOUNTER — Encounter: Payer: Self-pay | Admitting: Family Medicine

## 2022-10-17 ENCOUNTER — Other Ambulatory Visit: Payer: Self-pay | Admitting: Family Medicine

## 2022-10-17 DIAGNOSIS — G479 Sleep disorder, unspecified: Secondary | ICD-10-CM

## 2022-10-17 DIAGNOSIS — F5101 Primary insomnia: Secondary | ICD-10-CM

## 2022-10-18 ENCOUNTER — Ambulatory Visit (INDEPENDENT_AMBULATORY_CARE_PROVIDER_SITE_OTHER): Payer: Medicare Other | Admitting: Family Medicine

## 2022-10-18 DIAGNOSIS — Z0000 Encounter for general adult medical examination without abnormal findings: Secondary | ICD-10-CM

## 2022-10-18 NOTE — Patient Instructions (Addendum)
Strong Maintenance Summary and Written Plan of Care  Jasmine Fitzgerald ,  Thank you for allowing me to perform your Medicare Annual Wellness Visit and for your ongoing commitment to your health.   Health Maintenance & Immunization History Health Maintenance  Topic Date Due   Diabetic kidney evaluation - Urine ACR  10/19/2022 (Originally 03/06/2018)   FOOT EXAM  10/19/2022 (Originally 07/13/2022)   COVID-19 Vaccine (4 - 2023-24 season) 11/03/2022 (Originally 07/01/2022)   HEMOGLOBIN A1C  01/16/2023   MAMMOGRAM  05/11/2023   OPHTHALMOLOGY EXAM  05/24/2023   Diabetic kidney evaluation - eGFR measurement  07/27/2023   Medicare Annual Wellness (AWV)  10/19/2023   DTaP/Tdap/Td (4 - Td or Tdap) 05/06/2029   COLONOSCOPY (Pts 45-53yr Insurance coverage will need to be confirmed)  01/05/2030   Pneumonia Vaccine 75 Years old  Completed   INFLUENZA VACCINE  Completed   DEXA SCAN  Completed   Hepatitis C Screening  Completed   Zoster Vaccines- Shingrix  Completed   HPV VACCINES  Aged Out   Immunization History  Administered Date(s) Administered   Fluad Quad(high Dose 65+) 07/09/2020, 07/13/2021, 07/18/2022   Influenza Split 07/02/2015   Influenza, High Dose Seasonal PF 07/10/2017, 08/07/2018, 08/03/2019   Influenza, Seasonal, Injecte, Preservative Fre 08/08/2013, 07/28/2014   Influenza,inj,Quad PF,6+ Mos 08/08/2013, 07/02/2015, 07/18/2016   Influenza,inj,quad, With Preservative 07/28/2014   Moderna Sars-Covid-2 Vaccination 11/25/2019, 12/23/2019, 09/19/2020   Pneumococcal Conjugate-13 02/02/2015   Pneumococcal Polysaccharide-23 10/31/2012   Tdap 09/11/2008, 10/31/2008, 05/07/2019   Zoster Recombinat (Shingrix) 03/14/2017, 01/19/2018   Zoster, Live 05/06/2009, 10/31/2012    These are the patient goals that we discussed:  Goals Addressed               This Visit's Progress     Patient Stated (pt-stated)        Patient stated that she would like loose 7  lbs.         This is a list of Health Maintenance Items that are overdue or due now: Foot exam Urine ACR    Orders/Referrals Placed Today: No orders of the defined types were placed in this encounter.  (Contact our referral department at [phone] you have not spoken with someone about your referral appointment within the next 5 days)    Follow-up Plan Follow-up with MHali Marry MD as planned Medicare wellness visit in one year. Patient will access AVS on my chart.     Health Maintenance, Female Adopting a healthy lifestyle and getting preventive care are important in promoting health and wellness. Ask your health care provider about: The right schedule for you to have regular tests and exams. Things you can do on your own to prevent diseases and keep yourself healthy. What should I know about diet, weight, and exercise? Eat a healthy diet  Eat a diet that includes plenty of vegetables, fruits, low-fat dairy products, and lean protein. Do not eat a lot of foods that are high in solid fats, added sugars, or sodium. Maintain a healthy weight Body mass index (BMI) is used to identify weight problems. It estimates body fat based on height and weight. Your health care provider can help determine your BMI and help you achieve or maintain a healthy weight. Get regular exercise Get regular exercise. This is one of the most important things you can do for your health. Most adults should: Exercise for at least 150 minutes each week. The exercise should increase your heart rate and make  you sweat (moderate-intensity exercise). Do strengthening exercises at least twice a week. This is in addition to the moderate-intensity exercise. Spend less time sitting. Even light physical activity can be beneficial. Watch cholesterol and blood lipids Have your blood tested for lipids and cholesterol at 75 years of age, then have this test every 5 years. Have your cholesterol levels  checked more often if: Your lipid or cholesterol levels are high. You are older than 75 years of age. You are at high risk for heart disease. What should I know about cancer screening? Depending on your health history and family history, you may need to have cancer screening at various ages. This may include screening for: Breast cancer. Cervical cancer. Colorectal cancer. Skin cancer. Lung cancer. What should I know about heart disease, diabetes, and high blood pressure? Blood pressure and heart disease High blood pressure causes heart disease and increases the risk of stroke. This is more likely to develop in people who have high blood pressure readings or are overweight. Have your blood pressure checked: Every 3-5 years if you are 34-47 years of age. Every year if you are 56 years old or older. Diabetes Have regular diabetes screenings. This checks your fasting blood sugar level. Have the screening done: Once every three years after age 48 if you are at a normal weight and have a low risk for diabetes. More often and at a younger age if you are overweight or have a high risk for diabetes. What should I know about preventing infection? Hepatitis B If you have a higher risk for hepatitis B, you should be screened for this virus. Talk with your health care provider to find out if you are at risk for hepatitis B infection. Hepatitis C Testing is recommended for: Everyone born from [birth year] through 1964. Anyone with known risk factors for hepatitis C. Sexually transmitted infections (STIs) Get screened for STIs, including gonorrhea and chlamydia, if: You are sexually active and are younger than 75 years of age. You are older than 75 years of age and your health care provider tells you that you are at risk for this type of infection. Your sexual activity has changed since you were last screened, and you are at increased risk for chlamydia or gonorrhea. Ask your health care provider if you  are at risk. Ask your health care provider about whether you are at high risk for HIV. Your health care provider may recommend a prescription medicine to help prevent HIV infection. If you choose to take medicine to prevent HIV, you should first get tested for HIV. You should then be tested every 3 months for as long as you are taking the medicine. Pregnancy If you are about to stop having your period (premenopausal) and you may become pregnant, seek counseling before you get pregnant. Take 400 to 800 micrograms (mcg) of folic acid every day if you become pregnant. Ask for birth control (contraception) if you want to prevent pregnancy. Osteoporosis and menopause Osteoporosis is a disease in which the bones lose minerals and strength with aging. This can result in bone fractures. If you are 49 years old or older, or if you are at risk for osteoporosis and fractures, ask your health care provider if you should: Be screened for bone loss. Take a calcium or vitamin D supplement to lower your risk of fractures. Be given hormone replacement therapy (HRT) to treat symptoms of menopause. Follow these instructions at home: Alcohol use Do not drink alcohol if: Your health care  provider tells you not to drink. You are pregnant, may be pregnant, or are planning to become pregnant. If you drink alcohol: Limit how much you have to: 0-1 drink a day. Know how much alcohol is in your drink. In the [place] one drink equals one 12 oz bottle of beer (355 mL), one 5 oz glass of wine (148 mL), or one 1 oz glass of hard liquor (44 mL). Lifestyle Do not use any products that contain nicotine or tobacco. These products include cigarettes, chewing tobacco, and vaping devices, such as e-cigarettes. If you need help quitting, ask your health care provider. Do not use street drugs. Do not share needles. Ask your health care provider for help if you need support or information about quitting drugs. General  instructions Schedule regular health, dental, and eye exams. Stay current with your vaccines. Tell your health care provider if: You often feel depressed. You have ever been abused or do not feel safe at home. Summary Adopting a healthy lifestyle and getting preventive care are important in promoting health and wellness. Follow your health care provider's instructions about healthy diet, exercising, and getting tested or screened for diseases. Follow your health care provider's instructions on monitoring your cholesterol and blood pressure. This information is not intended to replace advice given to you by your health care provider. Make sure you discuss any questions you have with your health care provider. Document Revised: 03/08/2021 Document Reviewed: 03/08/2021 Elsevier Patient Education  [place].

## 2022-10-18 NOTE — Progress Notes (Signed)
MEDICARE ANNUAL WELLNESS VISIT  10/18/2022  Telephone Visit Disclaimer This Medicare AWV was conducted by telephone due to national recommendations for restrictions regarding the COVID-19 Pandemic (e.g. social distancing).  I verified, using two identifiers, that I am speaking with Jasmine Fitzgerald or their authorized healthcare agent. I discussed the limitations, risks, security, and privacy concerns of performing an evaluation and management service by telephone and the potential availability of an in-person appointment in the future. The patient expressed understanding and agreed to proceed.  Location of Patient: Home Location of Provider (nurse):  In the office.  Subjective:    Jasmine Fitzgerald is a 76 y.o. female patient of Metheney, Rene Kocher, MD who had a Medicare Annual Wellness Visit today via telephone. Jasmine Fitzgerald is Retired and lives with their spouse. she has 3 children. she reports that she is socially active and does interact with friends/family regularly. she is moderately physically active and enjoys yardwork, decorate and making flower arrangements.  Patient Care Team: Hali Marry, MD as PCP - General (Family Medicine) Dinah Beers, MD as Referring Physician (Vascular Surgery) Darlis Loan, MD as Referring Physician (Gastroenterology) Artist Pais, MD (Orthopedic Surgery)     10/18/2022   10:27 AM 10/15/2021    2:08 PM 03/27/2018   10:38 AM 05/31/2016    8:49 AM 02/15/2016   11:55 AM  Advanced Directives  Does Patient Have a Medical Advance Directive? _0   Type of Advance Directive Living will Living will;Healthcare Power of [place];Living will [place];Living will [place];Living will;Out of facility DNR (pink MOST or yellow form)  Does patient want to make changes to medical advance directive? No - Patient declined No - Patient declined   No - Patient declined  Copy of  [place] in Chart?  No - copy requested No - copy requested No - copy requested No - copy requested    Hospital Utilization Over the Past 12 Months: # of hospitalizations or ER visits: 0 # of surgeries: 1 (cataract surgery)  Review of Systems    Patient reports that her overall health is unchanged compared to last year.  History obtained from chart review and the patient  Patient Reported Readings (BP, Pulse, CBG, Weight, etc) none  Pain Assessment Pain : No/denies pain     Current Medications & Allergies (verified) Allergies as of 10/18/2022       Reactions   Other Itching   Steri strips at time of 12/2016 surg   Tape Itching   Steri strips at time of 12/2016 surg   Codeine Nausea And Vomiting, Other (See Comments)   GI Upset   Hydrocodone Nausea And Vomiting        Medication List        Accurate as of October 18, 2022 10:42 AM. If you have any questions, ask your nurse or doctor.          ALPRAZolam 0.25 MG tablet Commonly known as: XANAX Take 1 tablet (0.25 mg total) by mouth daily as needed for anxiety. Do not mix with Ambien   AMBULATORY NON FORMULARY MEDICATION Medication Name: Test strips.  Check glucose 2 times per week. #100.  Dx: Diabetes   aspirin 81 MG tablet Take by mouth.   blood glucose meter kit and supplies Kit Dispense based on patient and insurance preference. Use up to once daily as directed. (FOR ICD-9 250.00, 250.01).   celecoxib  200 MG capsule Commonly known as: CELEBREX Take 1 capsule (200 mg total) by mouth 2 (two) times daily.   cetirizine 10 MG tablet Commonly known as: ZYRTEC Take 10-20 mg by mouth daily. As needed.   clobetasol 0.05 % external solution Commonly known as: TEMOVATE Apply to scalp once or twice daily when needed for itching.   CURCUMIN 95 PO Take 3 tablets by mouth daily.   cyanocobalamin 100 MCG tablet Take 100 mcg by mouth daily.   FLUoxetine 20 MG capsule Commonly known as:  PROZAC Take 1 capsule (20 mg total) by mouth daily.   losartan 25 MG tablet Commonly known as: COZAAR Take 1 tablet (25 mg total) by mouth daily.   Restasis 0.05 % ophthalmic emulsion Generic drug: cycloSPORINE 1 drop 2 (two) times daily.   triamcinolone cream 0.1 % Commonly known as: KENALOG Apply to irritated & itchy areas of the body once or twice daily when needed. May mix in equal parts with moisturizer. Not to face.   True Metrix Blood Glucose Test test strip Generic drug: glucose blood USE UP TO ONCE DAILY AS DIRECTED   Vitamin D3 25 MCG (1000 UT) Caps Take 2 capsules by mouth daily.   zinc gluconate 50 MG tablet Take 50 mg by mouth daily.   zolpidem 5 MG tablet Commonly known as: AMBIEN Take 1 tablet (5 mg total) by mouth at bedtime as needed for sleep.        History (reviewed): Past Medical History:  Diagnosis Date   Carpal tunnel syndrome    Colon polyp    Hypertension    Kidney stones    Renal cyst    benign   Past Surgical History:  Procedure Laterality Date   ABDOMINAL SURGERY  12/16/2016   Pancreaticoduodenal aortic aneurysm repair   BREAST REDUCTION SURGERY  2013   CATARACT EXTRACTION W/ INTRAOCULAR LENS IMPLANT Bilateral 05/2022   HERNIA REPAIR  1996   KIDNEY STONE SURGERY  2015   KNEE ARTHROSCOPY     TONSILLECTOMY  1957   TRIGGER FINGER RELEASE  07/2015   UPPER GASTROINTESTINAL ENDOSCOPY     Family History  Problem Relation Age of Onset   Heart failure Mother    Heart failure Father    Heart attack Father    Diabetes Brother    Kidney failure Brother    Skin cancer Brother        severe   Stroke Maternal Grandmother    Cancer Maternal Aunt    Cancer Maternal Uncle    Ovarian cancer Other    Lung cancer Other    Throat cancer Other    Breast cancer Cousin    Social History   Socioeconomic History   Marital status: Married    Spouse name: Patrick Jupiter   Number of children: 3   Years of education: 12   Highest education level:  12th grade  Occupational History   Occupation: Retired  Tobacco Use   Smoking status: Never   Smokeless tobacco: Never  Vaping Use   Vaping Use: Never used  Substance and Sexual Activity   Alcohol use: Not Currently    Comment: occassionally   Drug use: No   Sexual activity: Not Currently    Birth control/protection: Surgical  Other Topics Concern   Not on file  Social History Narrative   Retired. Married to [place]. 3 adult children. Currently lives with her spouse. She enjoys yardwork, decorate and making flower arrangement.   Social Determinants of Health  Financial Resource Strain: Low Risk  (10/18/2022)   Overall Financial Resource Strain (CARDIA)    Difficulty of Paying Living Expenses: Not hard at all  Food Insecurity: No Food Insecurity (10/18/2022)   Hunger Vital Sign    Worried About Running Out of Food in the Last Year: Never true    Ran Out of Food in the Last Year: Never true  Transportation Needs: No Transportation Needs (10/18/2022)   PRAPARE - Hydrologist (Medical): No    Lack of Transportation (Non-Medical): No  Physical Activity: Sufficiently Active (10/18/2022)   Exercise Vital Sign    Days of Exercise per Week: 3 days    Minutes of Exercise per Session: 60 min  Stress: No Stress Concern Present (10/18/2022)   [place]    Feeling of Stress : Not at all  Social Connections: Moderately Integrated (10/18/2022)   Social Connection and Isolation Panel [NHANES]    Frequency of Communication with Friends and Family: More than three times a week    Frequency of Social Gatherings with Friends and Family: Twice a week    Attends Religious Services: More than 4 times per year    Active Member of [employer] or Organizations: No    Attends Archivist Meetings: Never    Marital Status: Married    Activities of Daily Living    10/18/2022   10:29 AM  In your  present state of health, do you have any difficulty performing the following activities:  Hearing? 1  Comment bilateral hearing aids.  Vision? 0  Difficulty concentrating or making decisions? 0  Walking or climbing stairs? 0  Dressing or bathing? 0  Doing errands, shopping? 0  Preparing Food and eating ? N  Using the Toilet? N  In the past six months, have you accidently leaked urine? N  Comment once in a while  Do you have problems with loss of bowel control? N  Managing your Medications? N  Managing your Finances? N  Housekeeping or managing your Housekeeping? N    Patient Education/ Literacy How often do you need to have someone help you when you read instructions, pamphlets, or other written materials from your doctor or pharmacy?: 1 - Never What is the last grade level you completed in school?: 12th grade  Exercise Current Exercise Habits: Structured exercise class, Type of exercise: strength training/weights;stretching, Time (Minutes): 60, Frequency (Times/Week): 3, Weekly Exercise (Minutes/Week): 180, Intensity: Moderate, Exercise limited by: None identified  Diet Patient reports consuming  2-3  meals a day and 1 snack(s) a day Patient reports that her primary diet is: Regular Patient reports that she does have regular access to food.   Depression Screen    10/18/2022   10:27 AM 10/15/2021    2:09 PM 07/13/2021   10:35 AM 03/01/2021   10:58 AM 09/29/2020   12:42 PM 07/09/2020   11:18 AM 07/09/2020   10:43 AM  PHQ 2/9 Scores  PHQ - 2 Score 0 0 0 0 5 0 0  PHQ- 9 Score   _0 0      Fall Risk    10/18/2022   10:27 AM 07/18/2022    1:14 PM 01/10/2022   11:12 AM 07/13/2021   10:40 AM 03/01/2021   10:52 AM  Fall Risk   Falls in the past year? 0 0 0 1 0  Number falls in past yr: 0 0 0 0 0  Injury  with Fall? 0 0 0 0 0  Risk for fall due to : _0   Follow up Falls evaluation completed Falls evaluation  completed Falls prevention discussed;Falls evaluation completed Falls prevention discussed;Falls evaluation completed Falls evaluation completed     Objective:  Jasmine Fitzgerald seemed alert and oriented and she participated appropriately during our telephone visit.  Blood Pressure Weight BMI  BP Readings from Last 3 Encounters:  07/18/22 (!) 137/54  01/10/22 127/62  10/11/21 137/73   Wt Readings from Last 3 Encounters:  01/10/22 155 lb (70.3 kg)  10/11/21 149 lb (67.6 kg)  09/16/21 150 lb (68 kg)   BMI Readings from Last 1 Encounters:  01/10/22 28.35 kg/m    *Unable to obtain current vital signs, weight, and BMI due to telephone visit type  Hearing/Vision  Jasmine Fitzgerald did not seem to have difficulty with hearing/understanding during the telephone conversation Reports that she has had a formal eye exam by an eye care professional within the past year Reports that she has had a formal hearing evaluation within the past year *Unable to fully assess hearing and vision during telephone visit type  Cognitive Function:    10/18/2022   10:34 AM 10/15/2021    2:18 PM 03/27/2018   10:53 AM 03/14/2017   10:39 AM  6CIT Screen  What Year? 0 points 0 points 0 points 0 points  What month? 0 points 0 points 0 points 0 points  What time? 0 points 0 points 0 points 0 points  Count back from 20 0 points 0 points 0 points 0 points  Months in reverse 0 points 0 points 0 points 0 points  Repeat phrase 0 points 0 points 0 points 0 points  Total Score 0 points 0 points 0 points 0 points   (Normal:0-7, Significant for Dysfunction: >8)  Normal Cognitive Function Screening: Yes   Immunization & Health Maintenance Record Immunization History  Administered Date(s) Administered   Fluad Quad(high Dose 65+) 07/09/2020, 07/13/2021, 07/18/2022   Influenza Split 07/02/2015   Influenza, High Dose Seasonal PF 07/10/2017, 08/07/2018, 08/03/2019   Influenza, Seasonal, Injecte, Preservative Fre 08/08/2013,  07/28/2014   Influenza,inj,Quad PF,6+ Mos 08/08/2013, 07/02/2015, 07/18/2016   Influenza,inj,quad, With Preservative 07/28/2014   Moderna Sars-Covid-2 Vaccination 11/25/2019, 12/23/2019, 09/19/2020   Pneumococcal Conjugate-13 02/02/2015   Pneumococcal Polysaccharide-23 10/31/2012   Tdap 09/11/2008, 10/31/2008, 05/07/2019   Zoster Recombinat (Shingrix) 03/14/2017, 01/19/2018   Zoster, Live 05/06/2009, 10/31/2012    Health Maintenance  Topic Date Due   Diabetic kidney evaluation - Urine ACR  10/19/2022 (Originally 03/06/2018)   FOOT EXAM  10/19/2022 (Originally 07/13/2022)   COVID-19 Vaccine (4 - 2023-24 season) 11/03/2022 (Originally 07/01/2022)   HEMOGLOBIN A1C  01/16/2023   MAMMOGRAM  05/11/2023   OPHTHALMOLOGY EXAM  05/24/2023   Diabetic kidney evaluation - eGFR measurement  07/27/2023   Medicare Annual Wellness (AWV)  10/19/2023   DTaP/Tdap/Td (4 - Td or Tdap) 05/06/2029   COLONOSCOPY (Pts 45-38yr Insurance coverage will need to be confirmed)  01/05/2030   Pneumonia Vaccine 75 Years old  Completed   INFLUENZA VACCINE  Completed   DEXA SCAN  Completed   Hepatitis C Screening  Completed   Zoster Vaccines- Shingrix  Completed   HPV VACCINES  Aged Out       Assessment  This is a routine wellness examination for Jasmine Fitzgerald.  Health Maintenance: Due or Overdue There are no preventive care reminders to  display for this patient.   Jasmine Fitzgerald does not need a referral for Community Assistance: Care Management:   no Social Work:    no Prescription Assistance:  no Nutrition/Diabetes Education:  no   Plan:  Personalized Goals  Goals Addressed               This Visit's Progress     Patient Stated (pt-stated)        Patient stated that she would like loose 7 lbs.       Personalized Health Maintenance & Screening Recommendations  Foot exam Urine ACR  Lung Cancer Screening Recommended: no (Low Dose CT Chest recommended if Age 51-80 years, 30 pack-year currently  smoking OR have quit w/in past 15 years) Hepatitis C Screening recommended: no HIV Screening recommended: no  Advanced Directives: Written information was not prepared per patient's request.  Referrals & Orders No orders of the defined types were placed in this encounter.   Follow-up Plan Follow-up with Hali Marry, MD as planned Medicare wellness visit in one year. Patient will access AVS on my chart.   I have personally reviewed and noted the following in the patient's chart:   Medical and social history Use of alcohol, tobacco or illicit drugs  Current medications and supplements Functional ability and status Nutritional status Physical activity Advanced directives List of other physicians Hospitalizations, surgeries, and ER visits in previous 12 months Vitals Screenings to include cognitive, depression, and falls Referrals and appointments  In addition, I have reviewed and discussed with Jasmine Fitzgerald certain preventive protocols, quality metrics, and best practice recommendations. A written personalized care plan for preventive services as well as general preventive health recommendations is available and can be mailed to the patient at her request.      Tinnie Gens, RN BSN  10/18/2022

## 2022-11-02 ENCOUNTER — Encounter: Payer: Self-pay | Admitting: Family Medicine

## 2022-11-02 ENCOUNTER — Ambulatory Visit (INDEPENDENT_AMBULATORY_CARE_PROVIDER_SITE_OTHER): Payer: Medicare Other | Admitting: Family Medicine

## 2022-11-02 VITALS — BP 118/67 | HR 99 | Temp 99.0°F | Ht 62.0 in | Wt 159.3 lb

## 2022-11-02 DIAGNOSIS — R051 Acute cough: Secondary | ICD-10-CM | POA: Diagnosis not present

## 2022-11-02 DIAGNOSIS — U071 COVID-19: Secondary | ICD-10-CM

## 2022-11-02 DIAGNOSIS — M791 Myalgia, unspecified site: Secondary | ICD-10-CM

## 2022-11-02 DIAGNOSIS — R300 Dysuria: Secondary | ICD-10-CM

## 2022-11-02 LAB — POCT INFLUENZA A/B
Influenza A, POC: NEGATIVE
Influenza B, POC: NEGATIVE

## 2022-11-02 LAB — POCT URINALYSIS DIP (CLINITEK)
Bilirubin, UA: NEGATIVE
Glucose, UA: NEGATIVE mg/dL
Leukocytes, UA: NEGATIVE
Nitrite, UA: NEGATIVE
Spec Grav, UA: 1.025 (ref 1.010–1.025)
Urobilinogen, UA: 1 E.U./dL
pH, UA: 5.5 (ref 5.0–8.0)

## 2022-11-02 LAB — POC COVID19 BINAXNOW: SARS Coronavirus 2 Ag: POSITIVE — AB

## 2022-11-02 MED ORDER — CEPHALEXIN 500 MG PO CAPS: 500.0000 mg | ORAL_CAPSULE | Freq: Two times a day (BID) | ORAL | 0 refills | Status: DC

## 2022-11-02 MED ORDER — BENZONATATE 200 MG PO CAPS: 200.0000 mg | ORAL_CAPSULE | Freq: Two times a day (BID) | ORAL | 0 refills | Status: DC | PRN

## 2022-11-02 MED ORDER — ALBUTEROL SULFATE HFA 108 (90 BASE) MCG/ACT IN AERS: 2.0000 | INHALATION_SPRAY | Freq: Four times a day (QID) | RESPIRATORY_TRACT | 0 refills | Status: DC | PRN

## 2022-11-02 NOTE — Progress Notes (Signed)
Acute Office Visit  Subjective:     Patient ID: Jasmine Fitzgerald, female    DOB: [birth year], 77 y.o.   MRN: [id number]  Chief Complaint  Patient presents with   Acute Visit    Patient in office c/o sinus pressure, bodyaches, cough x Saturday 10/29/22 Chronic Dizziness x this episode worse x  couple of months.  Urinary frequency and abdominal pressure, and ocasional urgency without a lot of production- Pressure " like whole bottom is going to fall out "- even with sitting  x before christmas around 2nd week of December- states feels like menstrual cramping denies vaginal bleeding     HPI Patient is in today for acute visit. Pt is new to me.  She reports on Saturday, she started having sore throat and cough that has progressively worsened until today. She also reports chills during this time. She used OTC cough medicine during this time. Denies SOB or chest pain. No wheezing. She also has had aches all over since this morning. She has had sick contacts with her daughter that was sick. She also had to go to funeral service after Christmas and the church was packed. She reports chronic dizziness that got worse during this URI symptoms.   She also has some dysuria with pelvic pressure. She explains it as burning pressure, doesn't matter if she is urinating or not. She says she was at [facility] the 2nd week in December and started having pressure with urinary frequency and urgency. She took Azo started on New Year's night.   Patient Active Problem List   Diagnosis Date Noted   Kidney stones 08/03/2020   Pelvic pressure in female 08/03/2020   Other microscopic hematuria 08/03/2020   Pseudogout right prepatellar bursa 04/25/2018   Primary osteoarthritis of right knee 03/12/2018   Alopecia areata 02/08/2018   Greater trochanteric bursitis of right hip 02/08/2018   Hemangioma of intra-abdominal structure 02/08/2018   Hyperlipidemia with target low density lipoprotein (LDL) cholesterol less than  130 mg/dL 02/08/2018   Osteopenia 02/08/2018   Vitamin D deficiency 02/08/2018   Wears hearing aid 03/14/2017   Primary insomnia 03/06/2017   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   Essential hypertension, benign 11/02/2016   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   DDD (degenerative disc disease), lumbar 08/30/2016   Numbness in left leg 08/30/2016   Sacroiliac joint pain 08/30/2016   Controlled type 2 diabetes mellitus without complication, without long-term current use of insulin ([place]) 08/29/2016   Chronic bilateral low back pain with left-sided sciatica 08/15/2016   Fibromyalgia 07/18/2016   Lumbar radiculopathy 05/24/2016   Atrophic vaginitis 02/15/2016   GERD (gastroesophageal reflux disease) 02/15/2016   Restless leg 02/15/2016   Renal cyst 02/15/2016   Disturbance in sleep behavior 02/02/2016   Depression with anxiety 07/15/2015   Gastroesophageal reflux disease without esophagitis 02/23/2015   Personal history of other malignant neoplasm of skin 01/15/2014   Family history of premature coronary heart disease 10/15/2013    Review of Systems  Constitutional:  Positive for chills and malaise/fatigue. Negative for fever.  HENT:  Positive for congestion, sinus pain and sore throat. Negative for ear pain.   Respiratory:  Positive for cough. Negative for sputum production, shortness of breath and wheezing.   Cardiovascular:  Negative for chest pain and palpitations.  Gastrointestinal:  Positive for abdominal pain.  Genitourinary:  Positive for dysuria, frequency and urgency. Negative for flank pain and hematuria.  All other systems reviewed and are negative.  Objective:    BP 118/67   Pulse 99   Temp 99 F (37.2 C)   Ht '5\' 2"'$  (1.575 m)   Wt 159 lb 5 oz (72.3 kg)   SpO2 96%   BMI 29.14 kg/m    Physical Exam Vitals and nursing note reviewed.  Constitutional:      General: She is not in acute distress.    Appearance: Normal appearance. She is  normal weight. She is not toxic-appearing.  HENT:     Head: Normocephalic and atraumatic.     Right Ear: Tympanic membrane, ear canal and external ear normal.     Left Ear: Tympanic membrane, ear canal and external ear normal.     Nose: Nose normal.     Mouth/Throat:     Mouth: Mucous membranes are moist.     Pharynx: Oropharynx is clear.  Eyes:     Conjunctiva/sclera: Conjunctivae normal.     Pupils: Pupils are equal, round, and reactive to light.  Cardiovascular:     Rate and Rhythm: Normal rate and regular rhythm.     Pulses: Normal pulses.     Heart sounds: Normal heart sounds.  Pulmonary:     Effort: Pulmonary effort is normal. No respiratory distress.     Breath sounds: Normal breath sounds. No wheezing, rhonchi or rales.  Abdominal:     General: Abdomen is flat. Bowel sounds are normal.  Neurological:     General: No focal deficit present.     Mental Status: She is alert and oriented to person, place, and time. Mental status is at baseline.  Psychiatric:        Mood and Affect: Mood normal.        Behavior: Behavior normal.        Thought Content: Thought content normal.        Judgment: Judgment normal.    Results for orders placed or performed in visit on 11/02/22  POC COVID-19  Result Value Ref Range   SARS Coronavirus 2 Ag Positive (A) Negative  POCT Influenza A/B  Result Value Ref Range   Influenza A, POC Negative Negative   Influenza B, POC Negative Negative  POCT URINALYSIS DIP (CLINITEK)  Result Value Ref Range   Color, UA yellow yellow   Clarity, UA cloudy (A) clear   Glucose, UA negative negative mg/dL   Bilirubin, UA negative negative   Ketones, POC UA trace (5) (A) negative mg/dL   Spec Grav, UA 1.025 1.010 - 1.025   Blood, UA large (A) negative   pH, UA 5.5 5.0 - 8.0   POC PROTEIN,UA trace negative, trace   Urobilinogen, UA 1.0 0.2 or 1.0 E.U./dL   Nitrite, UA Negative Negative   Leukocytes, UA Negative Negative        Assessment & Plan:    Problem List Items Addressed This Visit   None Visit Diagnoses     Acute cough    -  Primary   Relevant Orders   POC COVID-19 (Completed)   Myalgia       Relevant Orders   POCT Influenza A/B (Completed)   Dysuria       Relevant Medications   cephALEXin (KEFLEX) 500 MG capsule   Other Relevant Orders   POCT URINALYSIS DIP (CLINITEK) (Completed)   Urine Culture   COVID-19       Relevant Medications   benzonatate (TESSALON) 200 MG capsule   cephALEXin (KEFLEX) 500 MG capsule   albuterol (VENTOLIN HFA) 108 (90 Base)  MCG/ACT inhaler       Meds ordered this encounter  Medications   benzonatate (TESSALON) 200 MG capsule    Sig: Take 1 capsule (200 mg total) by mouth 2 (two) times daily as needed for cough.    Dispense:  20 capsule    Refill:  0   cephALEXin (KEFLEX) 500 MG capsule    Sig: Take 1 capsule (500 mg total) by mouth 2 (two) times daily.    Dispense:  14 capsule    Refill:  0   albuterol (VENTOLIN HFA) 108 (90 Base) MCG/ACT inhaler    Sig: Inhale 2 puffs into the lungs every 6 (six) hours as needed for wheezing or shortness of breath.    Dispense:  8 g    Refill:  0   Flu negative, Covid +. Pt's symptoms are mild and vital signs stable. Nontoxic and non-ill appearing. Paxlovid not indicated at this time.Sent in Tessalon perles to use prn for cough along with albuterol inhaler prn for shortness of breath or wheezing that may develop. None present at this time. Advised to quarantine and wear masks for at least 5 days and inform close contacts of diagnosis. Keflex for UTI symptoms. Sent urine for urine culture.  If symptoms persists despite treatment, may warrant further evaluation with pelvic exam vs TVUS.  No follow-ups on file.  Leeanne Rio, MD

## 2022-11-05 LAB — URINE CULTURE

## 2022-11-14 ENCOUNTER — Telehealth: Payer: Self-pay | Admitting: Family Medicine

## 2022-11-14 NOTE — Telephone Encounter (Signed)
Note is blank. Dpoes she need something?

## 2022-11-15 ENCOUNTER — Ambulatory Visit (INDEPENDENT_AMBULATORY_CARE_PROVIDER_SITE_OTHER): Payer: Medicare Other | Admitting: Family Medicine

## 2022-11-15 ENCOUNTER — Encounter: Payer: Self-pay | Admitting: Family Medicine

## 2022-11-15 VITALS — BP 140/62 | HR 67 | Ht 62.0 in | Wt 158.0 lb

## 2022-11-15 DIAGNOSIS — U071 COVID-19: Secondary | ICD-10-CM | POA: Diagnosis not present

## 2022-11-15 DIAGNOSIS — R42 Dizziness and giddiness: Secondary | ICD-10-CM | POA: Diagnosis not present

## 2022-11-15 MED ORDER — PREDNISONE 20 MG PO TABS: 40.0000 mg | ORAL_TABLET | Freq: Every day | ORAL | 0 refills | Status: DC

## 2022-11-15 NOTE — Progress Notes (Signed)
Acute Office Visit  Subjective:     Patient ID: Jasmine Fitzgerald, female    DOB: 12-05-1945, 77 y.o.   MRN: [id number]  Chief Complaint  Patient presents with   Cough    HPI Patient is in today for not feeling well after testing positive for COVID 2 weeks ago.Still having some head congestion and cough. Loss of appetite.   Left ear pressure.  She says she does feel a lot better than when she was first diagnosed about 2 weeks ago.  But still having some nasal manage, cough and just pretty severe fatigue.  She also has been experiencing dizziness on and off for couple months.  She noticed it once when she was bending her head down to write a check another time she bent over to turn something on and when she came back up she felt dizzy and lightheaded.  She says each time it happens that just last for few seconds.  ROS      Objective:    BP (!) 140/62   Pulse 67   Ht '5\' 2"'$  (1.575 m)   Wt 158 lb (71.7 kg)   SpO2 97%   BMI 28.90 kg/m    Physical Exam Constitutional:      Appearance: She is well-developed.  HENT:     Head: Normocephalic and atraumatic.     Right Ear: External ear normal.     Left Ear: External ear normal.     Nose: Nose normal.  Eyes:     Conjunctiva/sclera: Conjunctivae normal.     Pupils: Pupils are equal, round, and reactive to light.  Neck:     Thyroid: No thyromegaly.  Cardiovascular:     Rate and Rhythm: Normal rate and regular rhythm.     Heart sounds: Normal heart sounds.  Pulmonary:     Effort: Pulmonary effort is normal.     Breath sounds: Normal breath sounds. No wheezing.  Musculoskeletal:     Cervical back: Neck supple.  Lymphadenopathy:     Cervical: No cervical adenopathy.  Skin:    General: Skin is warm and dry.  Neurological:     Mental Status: She is alert and oriented to person, place, and time.     No results found for any visits on 11/15/22.      Assessment & Plan:   Problem List Items Addressed This Visit    None Visit Diagnoses     COVID-19    -  Primary   Vertigo          Still with persistent cough after having COVID-19 2 weeks ago.  Will treat with prednisone if she is not feeling better after the weekend then please let me know.  If the fatigue persists but the cough goes away then please let me know as well.  We can always workup further if needed.  Elevated blood pressure-normally blood pressure looks better and is really well-controlled but it is a little elevated today.  Vertigo - most consistent with BBPV.  Did not perform Dix-Hallpike maneuver today.  But did give her handout to do some exercises on her own.  If she is not improving okay to refer to vestibular rehab.  Meds ordered this encounter  Medications   predniSONE (DELTASONE) 20 MG tablet    Sig: Take 2 tablets (40 mg total) by mouth daily with breakfast.    Dispense:  10 tablet    Refill:  0    No follow-ups on file.  Beatrice Lecher, MD

## 2022-12-28 DIAGNOSIS — Z1283 Encounter for screening for malignant neoplasm of skin: Secondary | ICD-10-CM | POA: Diagnosis not present

## 2022-12-28 DIAGNOSIS — Z85828 Personal history of other malignant neoplasm of skin: Secondary | ICD-10-CM | POA: Diagnosis not present

## 2022-12-28 DIAGNOSIS — L820 Inflamed seborrheic keratosis: Secondary | ICD-10-CM | POA: Diagnosis not present

## 2022-12-28 DIAGNOSIS — L821 Other seborrheic keratosis: Secondary | ICD-10-CM | POA: Diagnosis not present

## 2023-01-14 ENCOUNTER — Other Ambulatory Visit: Payer: Self-pay | Admitting: Family Medicine

## 2023-01-14 DIAGNOSIS — F5101 Primary insomnia: Secondary | ICD-10-CM

## 2023-01-14 DIAGNOSIS — G479 Sleep disorder, unspecified: Secondary | ICD-10-CM

## 2023-01-16 ENCOUNTER — Ambulatory Visit: Payer: Medicare Other | Admitting: Family Medicine

## 2023-02-13 ENCOUNTER — Ambulatory Visit (INDEPENDENT_AMBULATORY_CARE_PROVIDER_SITE_OTHER): Payer: Medicare Other | Admitting: Family Medicine

## 2023-02-13 ENCOUNTER — Encounter: Payer: Self-pay | Admitting: Family Medicine

## 2023-02-13 VITALS — BP 129/56 | HR 81 | Ht 62.0 in | Wt 160.0 lb

## 2023-02-13 DIAGNOSIS — J0100 Acute maxillary sinusitis, unspecified: Secondary | ICD-10-CM

## 2023-02-13 DIAGNOSIS — F5101 Primary insomnia: Secondary | ICD-10-CM

## 2023-02-13 DIAGNOSIS — R809 Proteinuria, unspecified: Secondary | ICD-10-CM | POA: Diagnosis not present

## 2023-02-13 DIAGNOSIS — E119 Type 2 diabetes mellitus without complications: Secondary | ICD-10-CM | POA: Diagnosis not present

## 2023-02-13 DIAGNOSIS — E559 Vitamin D deficiency, unspecified: Secondary | ICD-10-CM

## 2023-02-13 DIAGNOSIS — R5383 Other fatigue: Secondary | ICD-10-CM

## 2023-02-13 DIAGNOSIS — I10 Essential (primary) hypertension: Secondary | ICD-10-CM | POA: Diagnosis not present

## 2023-02-13 LAB — POCT UA - MICROALBUMIN
Creatinine, POC: 50 mg/dL
Microalbumin Ur, POC: 30 mg/L

## 2023-02-13 LAB — CBC WITH DIFFERENTIAL/PLATELET
Basophils Relative: 0.7 %
Hemoglobin: 13 g/dL (ref 11.7–15.5)
MCH: 31.2 pg (ref 27.0–33.0)
Monocytes Relative: 9.7 %
Neutro Abs: 4298 cells/uL (ref 1500–7800)
Neutrophils Relative %: 59.7 %
Platelets: 214 10*3/uL (ref 140–400)
RDW: 12.6 % (ref 11.0–15.0)

## 2023-02-13 LAB — POCT GLYCOSYLATED HEMOGLOBIN (HGB A1C): Hemoglobin A1C: 6.1 % — AB (ref 4.0–5.6)

## 2023-02-13 MED ORDER — FLUOXETINE HCL 20 MG PO CAPS: 20.0000 mg | ORAL_CAPSULE | Freq: Every day | ORAL | 3 refills | Status: DC

## 2023-02-13 MED ORDER — AZITHROMYCIN 250 MG PO TABS: ORAL_TABLET | ORAL | 0 refills | Status: AC

## 2023-02-13 MED ORDER — LOSARTAN POTASSIUM 25 MG PO TABS
25.0000 mg | ORAL_TABLET | Freq: Every day | ORAL | 3 refills | Status: DC
Start: 2023-02-13 — End: 2024-02-01

## 2023-02-13 MED ORDER — FLUOXETINE HCL 10 MG PO CAPS: 10.0000 mg | ORAL_CAPSULE | Freq: Every day | ORAL | 1 refills | Status: DC

## 2023-02-13 MED ORDER — ZALEPLON 5 MG PO CAPS
5.0000 mg | ORAL_CAPSULE | Freq: Every evening | ORAL | 0 refills | Status: DC | PRN
Start: 2023-02-13 — End: 2023-03-13

## 2023-02-13 NOTE — Progress Notes (Signed)
Established Patient Office Visit  Subjective   Patient ID: Jasmine Fitzgerald, female    DOB: 15-Apr-1946  Age: 77 y.o. MRN: [id number]  Chief Complaint  Patient presents with   Diabetes   Hypertension    HPI  Diabetes - no hypoglycemic events. No wounds or sores that are not healing well. No increased thirst or urination. Checking glucose at home. Taking medications as prescribed without any side effects.  Hypertension- Pt denies chest pain, SOB, dizziness, or heart palpitations.  Taking meds as directed w/o problems.  Denies medication side effects.   Still having some occasional vertigo especially when she lays back in bed.  But is just occasionally.  She just feels completely exhausted and fatigued she says it has been going on since October she says sometimes she will take multiple naps during the day.  She is not sure if it could be a medication side effect.  No abnormal weight loss.  He does not snore.  She also reports that for a week she has had what started with a scratchy throat and then turned into significant nasal congestion.  She was taking Mucinex but then switched to Zyrtec today and feels like that has helped a little bit.  She has had some pain in her maxillary sinuses particularly on the right and has been getting some green nasal discharge.  She has been running her humidifier.     ROS    Objective:     BP (!) 129/56   Pulse 81   Ht 5\' 2"  (1.575 m)   Wt 160 lb (72.6 kg)   SpO2 96%   BMI 29.26 kg/m    Physical Exam Constitutional:      Appearance: She is well-developed.  HENT:     Head: Normocephalic and atraumatic.     Comments: Some facial swelling over her right cheek.     Right Ear: External ear normal.     Left Ear: External ear normal.     Nose: Nose normal.  Eyes:     Conjunctiva/sclera: Conjunctivae normal.     Pupils: Pupils are equal, round, and reactive to light.  Neck:     Thyroid: No thyromegaly.  Cardiovascular:     Rate and  Rhythm: Normal rate and regular rhythm.     Heart sounds: Normal heart sounds.  Pulmonary:     Effort: Pulmonary effort is normal.     Breath sounds: Normal breath sounds. No wheezing.  Musculoskeletal:     Cervical back: Neck supple.  Lymphadenopathy:     Cervical: No cervical adenopathy.  Skin:    General: Skin is warm and dry.  Neurological:     Mental Status: She is alert and oriented to person, place, and time.      Results for orders placed or performed in visit on 02/13/23  POCT glycosylated hemoglobin (Hb A1C)  Result Value Ref Range   Hemoglobin A1C 6.1 (A) 4.0 - 5.6 %   HbA1c POC (<> result, manual entry)     HbA1c, POC (prediabetic range)     HbA1c, POC (controlled diabetic range)    POCT UA - Microalbumin  Result Value Ref Range   Microalbumin Ur, POC 30 mg/L   Creatinine, POC 50 mg/dL   Albumin/Creatinine Ratio, Urine, POC 30-300       The 10-year ASCVD risk score (Arnett DK, et al., 2019) is: 37.3%    Assessment & Plan:   Problem List Items Addressed This Visit  Cardiovascular and Mediastinum   Essential hypertension, benign    Well controlled. Continue current regimen. Follow up in  28mo       Relevant Medications   losartan (COZAAR) 25 MG tablet   Other Relevant Orders   CBC with Differential/Platelet   COMPLETE METABOLIC PANEL WITH GFR   TSH   VITAMIN D 25 Hydroxy (Vit-D Deficiency, Fractures)     Endocrine   Controlled type 2 diabetes mellitus without complication, without long-term current use of insulin - Primary    1C is up just slightly at 6.1 today but fairly stable.  Continue work on [employer] and regular exercise.  She is showing a little bit of protein in the urine today so we will recheck it at her follow-up in 1 month.  If persistent then we will address.  She is already on losartan 25 mg daily.      Relevant Medications   losartan (COZAAR) 25 MG tablet   Other Relevant Orders   POCT glycosylated hemoglobin (Hb  A1C) (Completed)   POCT UA - Microalbumin (Completed)   CBC with Differential/Platelet   COMPLETE METABOLIC PANEL WITH GFR   TSH   VITAMIN D 25 Hydroxy (Vit-D Deficiency, Fractures)     Other   Vitamin D deficiency   Relevant Orders   CBC with Differential/Platelet   COMPLETE METABOLIC PANEL WITH GFR   TSH   VITAMIN D 25 Hydroxy (Vit-D Deficiency, Fractures)   Primary insomnia   Relevant Medications   zaleplon (SONATA) 5 MG capsule   Other Visit Diagnoses     Other fatigue       Relevant Orders   CBC with Differential/Platelet   COMPLETE METABOLIC PANEL WITH GFR   TSH   VITAMIN D 25 Hydroxy (Vit-D Deficiency, Fractures)   Acute non-recurrent maxillary sinusitis       Relevant Medications   azithromycin (ZITHROMAX) 250 MG tablet   Microalbuminuria          Acute sinusitis-we will treat with azithromycin.  Call if not better in 1 week continue with humidifier and nasal saline.  K to continue the Zyrtec as well.  Fatigue-unclear etiology but she is sleeping long hours during the day.  She does not snore so sleep apnea is less potential cause.  Certainly could be medication side effect so we discussed switching to [facility] which is shorter acting than her Ambien and also cutting back on the Prozac to 10 mg instead of 20 mg to see if that helps.  She will let me know in about 2 weeks how she is feeling and I definitely want to regroup in about a month.  If she is doing better off on a decreased dose of fluoxetine we might be able to switch to a different SSRI.   Microalbuminuria-will plan to recheck at follow-up in 1 month.  She is not spilling protein before and her A1c is actually well-controlled.    Return in about 4 weeks (around 03/13/2023) for fatigue.   I spent 40 minutes on the day of the encounter to include pre-visit record review, face-to-face time with the patient and post visit ordering of test.   Nani Gasser, MD

## 2023-02-13 NOTE — Patient Instructions (Signed)
Let me know in a couple of weeks how you are feeling and if you are feeling like the fatigue is better.

## 2023-02-13 NOTE — Assessment & Plan Note (Signed)
1C is up just slightly at 6.1 today but fairly stable.  Continue work on [employer] and regular exercise.  She is showing a little bit of protein in the urine today so we will recheck it at her follow-up in 1 month.  If persistent then we will address.  She is already on losartan 25 mg daily.

## 2023-02-13 NOTE — Assessment & Plan Note (Signed)
Well controlled. Continue current regimen. Follow up in  6 mo  

## 2023-02-14 LAB — COMPLETE METABOLIC PANEL WITH GFR
AG Ratio: 1.7 (calc) (ref 1.0–2.5)
ALT: 8 U/L (ref 6–29)
AST: 12 U/L (ref 10–35)
Albumin: 4.4 g/dL (ref 3.6–5.1)
Alkaline phosphatase (APISO): 38 U/L (ref 37–153)
BUN: 20 mg/dL (ref 7–25)
CO2: 29 mmol/L (ref 20–32)
Calcium: 9.6 mg/dL (ref 8.6–10.4)
Chloride: 104 mmol/L (ref 98–110)
Creat: 0.65 mg/dL (ref 0.60–1.00)
Globulin: 2.6 g/dL (calc) (ref 1.9–3.7)
Glucose, Bld: 90 mg/dL (ref 65–99)
Potassium: 4.3 mmol/L (ref 3.5–5.3)
Sodium: 144 mmol/L (ref 135–146)
Total Bilirubin: 0.4 mg/dL (ref 0.2–1.2)
Total Protein: 7 g/dL (ref 6.1–8.1)
eGFR: 92 mL/min/{1.73_m2} (ref >=60)

## 2023-02-14 LAB — CBC WITH DIFFERENTIAL/PLATELET
Absolute Monocytes: 698 cells/uL (ref 200–950)
Basophils Absolute: 50 cells/uL (ref 0–200)
Eosinophils Absolute: 310 cells/uL (ref 15–500)
Eosinophils Relative: 4.3 %
HCT: 39.1 % (ref 35.0–45.0)
Lymphs Abs: 1843 cells/uL (ref 850–3900)
MCHC: 33.2 g/dL (ref 32.0–36.0)
MCV: 93.8 fL (ref 80.0–100.0)
MPV: 9.1 fL (ref 7.5–12.5)
RBC: 4.17 10*6/uL (ref 3.80–5.10)
Total Lymphocyte: 25.6 %
WBC: 7.2 10*3/uL (ref 3.8–10.8)

## 2023-02-14 LAB — TSH: TSH: 1.08 mIU/L (ref 0.40–4.50)

## 2023-02-14 LAB — VITAMIN D 25 HYDROXY (VIT D DEFICIENCY, FRACTURES): Vit D, 25-Hydroxy: 33 ng/mL (ref 30–100)

## 2023-02-14 NOTE — Progress Notes (Signed)
Jasmine Fitzgerald, your blood count, metabolic panel and thyroid look great.  Vitamin D is technically in the normal range and is slowly trending upward but is still on the low end so please continue taking extra vitamin D.

## 2023-02-23 DIAGNOSIS — Z48812 Encounter for surgical aftercare following surgery on the circulatory system: Secondary | ICD-10-CM | POA: Diagnosis not present

## 2023-02-23 DIAGNOSIS — I708 Atherosclerosis of other arteries: Secondary | ICD-10-CM | POA: Diagnosis not present

## 2023-03-13 ENCOUNTER — Encounter: Payer: Self-pay | Admitting: Family Medicine

## 2023-03-13 ENCOUNTER — Ambulatory Visit (INDEPENDENT_AMBULATORY_CARE_PROVIDER_SITE_OTHER): Payer: Medicare Other | Admitting: Family Medicine

## 2023-03-13 VITALS — BP 132/68 | HR 64 | Ht 62.0 in | Wt 160.0 lb

## 2023-03-13 DIAGNOSIS — I700 Atherosclerosis of aorta: Secondary | ICD-10-CM | POA: Insufficient documentation

## 2023-03-13 DIAGNOSIS — I10 Essential (primary) hypertension: Secondary | ICD-10-CM

## 2023-03-13 DIAGNOSIS — F5101 Primary insomnia: Secondary | ICD-10-CM

## 2023-03-13 DIAGNOSIS — F418 Other specified anxiety disorders: Secondary | ICD-10-CM | POA: Diagnosis not present

## 2023-03-13 DIAGNOSIS — R809 Proteinuria, unspecified: Secondary | ICD-10-CM | POA: Diagnosis not present

## 2023-03-13 LAB — POCT UA - MICROALBUMIN
Albumin/Creatinine Ratio, Urine, POC: <30
Creatinine, POC: 10 mg/dL
Microalbumin Ur, POC: 10 mg/L

## 2023-03-13 MED ORDER — ESCITALOPRAM OXALATE 10 MG PO TABS
ORAL_TABLET | ORAL | 1 refills | Status: DC
Start: 2023-03-13 — End: 2023-05-05

## 2023-03-13 NOTE — Progress Notes (Signed)
Pt stated that she is not as tired as she was when she was here 4 weeks ago.   She is doing well on Fluoxetine 10 mg daily. However, she said that the Sonata 5 mg she didn't do so well with. She tried taking for 3-4 days and had to stop taking it because it cause her to wake up during the night. She restarted the Ambien 5 mg.

## 2023-03-13 NOTE — Progress Notes (Signed)
Hi Jasmine Fitzgerald, protein levels in the urine are back to normal.

## 2023-03-13 NOTE — Assessment & Plan Note (Signed)
Taper off fluoxetine and start Lexapro.  New prescription sent to pharmacy.  Follow-up in 8 weeks.

## 2023-03-13 NOTE — Assessment & Plan Note (Signed)
Recent imaging to follow-up on the celiac artery shows aortic atherosclerosis.  I asked her if she had ever been recommended to start a statin by the vascular surgeon and she said no.  Based on these current findings I think she would benefit greatly.  We also discussed evaluating the coronary arteries with possibly a calcium CT score

## 2023-03-13 NOTE — Assessment & Plan Note (Signed)
Sonata was not helpful.  So switched back to Ambien 5 mg.  She is timing it a little bit differently and that seems to be working well discussed continuing to work on consistent bedtime.

## 2023-03-13 NOTE — Assessment & Plan Note (Signed)
BP at goal 

## 2023-03-13 NOTE — Progress Notes (Signed)
Established Patient Office Visit  Subjective   Patient ID: Jasmine Fitzgerald, female    DOB: 09/22/1946  Age: 77 y.o. MRN: [id number]  Chief Complaint  Patient presents with   Fatigue    HPI   Pt stated that she is not as tired as she was when she was here 4 weeks ago.    She is doing well on Fluoxetine 10 mg daily. However, she said that the Sonata 5 mg she didn't do so well with. She tried taking for 3-4 days and had to stop taking it because it cause her to wake up during the night. She restarted the Ambien 5 mg.    Emotionally she still feels like she is struggling with some irritability.  Show easily snapped at her husband he has had a little bit of to cognitive decline and has been speaking more slowly just really grates on her nerves at times.  She does try to read before bedtime and tries to relax.  But does not necessarily have a consistent bedtime.  Had a urine that showed some mild microalbuminuria about a month ago so wanted to recheck that again today.  Her last A1c was well-controlled.    ROS    Objective:     BP 132/68   Pulse 64   Ht 5\' 2"  (1.575 m)   Wt 160 lb (72.6 kg)   SpO2 97%   BMI 29.26 kg/m    Physical Exam Vitals and nursing note reviewed.  Constitutional:      Appearance: She is well-developed.  HENT:     Head: Normocephalic and atraumatic.  Cardiovascular:     Rate and Rhythm: Normal rate and regular rhythm.     Heart sounds: Normal heart sounds.  Pulmonary:     Effort: Pulmonary effort is normal.     Breath sounds: Normal breath sounds.  Skin:    General: Skin is warm and dry.  Neurological:     Mental Status: She is alert and oriented to person, place, and time.  Psychiatric:        Behavior: Behavior normal.      Results for orders placed or performed in visit on 03/13/23  POCT UA - Microalbumin  Result Value Ref Range   Microalbumin Ur, POC 10 mg/L   Creatinine, POC 10 mg/dL   Albumin/Creatinine Ratio, Urine, POC <30        The 10-year ASCVD risk score (Arnett DK, et al., 2019) is: 38.7%    Assessment & Plan:   Problem List Items Addressed This Visit       Cardiovascular and Mediastinum   Essential hypertension, benign - Primary    BP at goal.       Aortic atherosclerosis (HCC)    Recent imaging to follow-up on the celiac artery shows aortic atherosclerosis.  I asked her if she had ever been recommended to start a statin by the vascular surgeon and she said no.  Based on these current findings I think she would benefit greatly.  We also discussed evaluating the coronary arteries with possibly a calcium CT score      Relevant Orders   CT CARDIAC SCORING (SELF PAY ONLY)     Other   Primary insomnia    Sonata was not helpful.  So switched back to Ambien 5 mg.  She is timing it a little bit differently and that seems to be working well discussed continuing to work on consistent bedtime.  Depression with anxiety    Taper off fluoxetine and start Lexapro.  New prescription sent to pharmacy.  Follow-up in 8 weeks.      Relevant Medications   escitalopram (LEXAPRO) 10 MG tablet   Other Visit Diagnoses     Urine test positive for microalbuminuria       Relevant Orders   POCT UA - Microalbumin (Completed)      Repeat urine sample was negative for protein.  Return in about 3 months (around 06/13/2023) for dm.    Nani Gasser, MD

## 2023-03-31 ENCOUNTER — Other Ambulatory Visit: Payer: Self-pay | Admitting: Family Medicine

## 2023-04-15 ENCOUNTER — Other Ambulatory Visit: Payer: Self-pay | Admitting: Family Medicine

## 2023-04-24 ENCOUNTER — Other Ambulatory Visit: Payer: Self-pay | Admitting: Family Medicine

## 2023-04-24 DIAGNOSIS — G479 Sleep disorder, unspecified: Secondary | ICD-10-CM

## 2023-04-24 DIAGNOSIS — F5101 Primary insomnia: Secondary | ICD-10-CM

## 2023-05-05 ENCOUNTER — Other Ambulatory Visit: Payer: Self-pay | Admitting: Family Medicine

## 2023-05-05 DIAGNOSIS — F418 Other specified anxiety disorders: Secondary | ICD-10-CM

## 2023-05-17 DIAGNOSIS — Z1231 Encounter for screening mammogram for malignant neoplasm of breast: Secondary | ICD-10-CM | POA: Diagnosis not present

## 2023-05-17 DIAGNOSIS — R92323 Mammographic fibroglandular density, bilateral breasts: Secondary | ICD-10-CM | POA: Diagnosis not present

## 2023-05-17 LAB — HM MAMMOGRAPHY

## 2023-05-19 ENCOUNTER — Encounter: Payer: Self-pay | Admitting: Family Medicine

## 2023-05-21 NOTE — Telephone Encounter (Signed)
Pls call pharm to OK early refill

## 2023-05-22 NOTE — Telephone Encounter (Signed)
I called and gave the ok to fill early.

## 2023-06-01 ENCOUNTER — Encounter: Payer: Self-pay | Admitting: Family Medicine

## 2023-06-14 ENCOUNTER — Ambulatory Visit: Payer: Medicare Other | Admitting: Family Medicine

## 2023-06-14 ENCOUNTER — Encounter: Payer: Self-pay | Admitting: Family Medicine

## 2023-06-14 ENCOUNTER — Ambulatory Visit (INDEPENDENT_AMBULATORY_CARE_PROVIDER_SITE_OTHER): Payer: Self-pay

## 2023-06-14 VITALS — BP 128/50 | HR 64 | Ht 64.0 in | Wt 160.0 lb

## 2023-06-14 DIAGNOSIS — E119 Type 2 diabetes mellitus without complications: Secondary | ICD-10-CM | POA: Diagnosis not present

## 2023-06-14 DIAGNOSIS — I700 Atherosclerosis of aorta: Secondary | ICD-10-CM

## 2023-06-14 DIAGNOSIS — F418 Other specified anxiety disorders: Secondary | ICD-10-CM | POA: Diagnosis not present

## 2023-06-14 DIAGNOSIS — F5101 Primary insomnia: Secondary | ICD-10-CM | POA: Diagnosis not present

## 2023-06-14 DIAGNOSIS — I728 Aneurysm of other specified arteries: Secondary | ICD-10-CM

## 2023-06-14 DIAGNOSIS — I10 Essential (primary) hypertension: Secondary | ICD-10-CM | POA: Diagnosis not present

## 2023-06-14 LAB — POCT GLYCOSYLATED HEMOGLOBIN (HGB A1C): Hemoglobin A1C: 6 % — AB (ref 4.0–5.6)

## 2023-06-14 MED ORDER — ESCITALOPRAM OXALATE 10 MG PO TABS
10.0000 mg | ORAL_TABLET | Freq: Every day | ORAL | 1 refills | Status: DC
Start: 2023-06-14 — End: 2023-08-29

## 2023-06-14 NOTE — Progress Notes (Signed)
Established Patient Office Visit  Subjective   Patient ID: Jasmine Fitzgerald, female    DOB: 05-Feb-1946  Age: 77 y.o. MRN: [id number]  Chief Complaint  Patient presents with   Diabetes    HPI   Diabetes - no hypoglycemic events. No wounds or sores that are not healing well. No increased thirst or urination. Checking glucose at home. Taking medications as prescribed without any side effects.  We had dicussed previously getting a Cardiac Ct and wanted to discuss further today.  Her history of celiac artery stent and aneurysm.       ROS    Objective:     BP (!) 128/50   Pulse 64   Ht 5\' 4"  (1.626 m)   Wt 160 lb (72.6 kg)   SpO2 99%   BMI 27.46 kg/m    Physical Exam Vitals and nursing note reviewed.  Constitutional:      Appearance: She is well-developed.  HENT:     Head: Normocephalic and atraumatic.  Cardiovascular:     Rate and Rhythm: Normal rate and regular rhythm.     Heart sounds: Normal heart sounds.  Pulmonary:     Effort: Pulmonary effort is normal.     Breath sounds: Normal breath sounds.  Skin:    General: Skin is warm and dry.  Neurological:     Mental Status: She is alert and oriented to person, place, and time.  Psychiatric:        Behavior: Behavior normal.      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  She says that she was never encouraged to start a statin around that time.  We did  discuss evaluating the coronary arteries with a calcium CT score think this would give [place] more evidence if we need to consider starting a statin.  She does take an aspirin daily.      Essential hypertension, benign    Blood pressure looks great today.  Continue current regimen.        Endocrine   Controlled type 2 diabetes mellitus without complication, without long-term current use of insulin (HCC) - Primary    A1 C looks great today.  Continue current regimen continue to stay active.  Follow-up in 4 months.  Lab Results  Component Value Date   HGBA1C 6.0 (A) 06/14/2023         Relevant Orders   POCT glycosylated hemoglobin (Hb A1C) (Completed)     Other   Primary insomnia    Tolerating the Ambien well without any side effects or problems.  Occasionally she will still have a night where she does not sleep well even with taking the medication but most nights it seems to be effective.      Depression with anxiety    Be with her current regimen of Lexapro.  She feels like it is effective and helpful.      Relevant Medications   escitalopram (LEXAPRO) 10 MG tablet    Return in about 4 months (around 10/14/2023) for Diabetes follow-up.    Nani Gasser, MD

## 2023-06-14 NOTE — Assessment & Plan Note (Signed)
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  She says that she was never encouraged to start a statin around that time.  We did discuss evaluating the coronary arteries with a calcium CT score think this would give [place] more evidence if we need to consider starting a statin.  She does take an aspirin daily.

## 2023-06-14 NOTE — Assessment & Plan Note (Signed)
A1 C looks great today.  Continue current regimen continue to stay active.  Follow-up in 4 months.  Lab Results  Component Value Date   HGBA1C 6.0 (A) 06/14/2023

## 2023-06-14 NOTE — Assessment & Plan Note (Signed)
Be with her current regimen of Lexapro.  She feels like it is effective and helpful.

## 2023-06-14 NOTE — Assessment & Plan Note (Signed)
Tolerating the Ambien well without any side effects or problems.  Occasionally she will still have a night where she does not sleep well even with taking the medication but most nights it seems to be effective.

## 2023-06-14 NOTE — Assessment & Plan Note (Signed)
Blood pressure looks great today.  Continue current regimen.

## 2023-06-27 NOTE — Progress Notes (Signed)
Hi Jasmine Fitzgerald, great news is that your calcium score was 0.  You do have some plaque or atherosclerosis but it is not significant enough to cause a major problem at this point in time which is fantastic "they also did an over read and did note that you do have a small hiatal hernia.  That is where the esophagus meets the stomach and part of that is sliding up a little bit into the chest cavity it can cause you to have more reflux.  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  But no other abnormal findings.

## 2023-07-04 ENCOUNTER — Other Ambulatory Visit: Payer: Self-pay | Admitting: Family Medicine

## 2023-07-19 ENCOUNTER — Other Ambulatory Visit: Payer: Self-pay | Admitting: Family Medicine

## 2023-07-19 DIAGNOSIS — F5101 Primary insomnia: Secondary | ICD-10-CM

## 2023-07-19 DIAGNOSIS — G479 Sleep disorder, unspecified: Secondary | ICD-10-CM

## 2023-08-22 DIAGNOSIS — H35362 Drusen (degenerative) of macula, left eye: Secondary | ICD-10-CM | POA: Diagnosis not present

## 2023-08-22 DIAGNOSIS — H35373 Puckering of macula, bilateral: Secondary | ICD-10-CM | POA: Diagnosis not present

## 2023-08-22 DIAGNOSIS — Z961 Presence of intraocular lens: Secondary | ICD-10-CM | POA: Diagnosis not present

## 2023-08-22 DIAGNOSIS — H16103 Unspecified superficial keratitis, bilateral: Secondary | ICD-10-CM | POA: Diagnosis not present

## 2023-08-25 ENCOUNTER — Other Ambulatory Visit: Payer: Self-pay | Admitting: Family Medicine

## 2023-08-25 DIAGNOSIS — F418 Other specified anxiety disorders: Secondary | ICD-10-CM

## 2023-09-22 ENCOUNTER — Other Ambulatory Visit: Payer: Self-pay | Admitting: Family Medicine

## 2023-10-16 ENCOUNTER — Encounter: Payer: Self-pay | Admitting: Family Medicine

## 2023-10-16 ENCOUNTER — Ambulatory Visit (INDEPENDENT_AMBULATORY_CARE_PROVIDER_SITE_OTHER): Payer: Medicare Other | Admitting: Family Medicine

## 2023-10-16 VITALS — BP 128/51 | HR 87 | Ht 62.0 in | Wt 167.8 lb

## 2023-10-16 DIAGNOSIS — E119 Type 2 diabetes mellitus without complications: Secondary | ICD-10-CM | POA: Diagnosis not present

## 2023-10-16 DIAGNOSIS — E66811 Obesity, class 1: Secondary | ICD-10-CM

## 2023-10-16 DIAGNOSIS — I10 Essential (primary) hypertension: Secondary | ICD-10-CM

## 2023-10-16 DIAGNOSIS — Z6830 Body mass index (BMI) 30.0-30.9, adult: Secondary | ICD-10-CM | POA: Insufficient documentation

## 2023-10-16 DIAGNOSIS — R1319 Other dysphagia: Secondary | ICD-10-CM

## 2023-10-16 DIAGNOSIS — G479 Sleep disorder, unspecified: Secondary | ICD-10-CM

## 2023-10-16 DIAGNOSIS — F3341 Major depressive disorder, recurrent, in partial remission: Secondary | ICD-10-CM | POA: Diagnosis not present

## 2023-10-16 DIAGNOSIS — F5101 Primary insomnia: Secondary | ICD-10-CM

## 2023-10-16 DIAGNOSIS — F418 Other specified anxiety disorders: Secondary | ICD-10-CM

## 2023-10-16 LAB — POCT GLYCOSYLATED HEMOGLOBIN (HGB A1C): HbA1c, POC (controlled diabetic range): 6.1 % (ref 0.0–7.0)

## 2023-10-16 MED ORDER — ESCITALOPRAM OXALATE 10 MG PO TABS
10.0000 mg | ORAL_TABLET | Freq: Every day | ORAL | 1 refills | Status: DC
Start: 2023-10-16 — End: 2024-04-02

## 2023-10-16 NOTE — Patient Instructions (Signed)
Feel free to use one of the apps like "my fitness pal" or "lose it" to help you set goals and track calories.

## 2023-10-16 NOTE — Progress Notes (Signed)
Established Patient Office Visit  Subjective  Patient ID: Jasmine Fitzgerald, female    DOB: 11-08-1945  Age: 77 y.o. MRN: [id number]  Chief Complaint  Patient presents with   Diabetes    Diabetes follow up visit.     HPI   Diabetes - no hypoglycemic events. No wounds or sores that are not healing well. No increased thirst or urination. Checking glucose at home. Taking medications as prescribed without any side effects.  Follow-up depression/anxiety-currently on Lexapro 10 mg and as needed alprazolam.  Zolpidem 5 mg nightly for sleep. Needs refills. Wondering if Lexapro is causing some weight gain.  No major bump in appetite.  Feels like meds is working well for mood.   BMI 30 and interested in weight loss.   She also wanted to mention that she has had just a mild intermittent cough mostly in that upper chest lower throat area since September she said initially she had a cold and got better but now she is just noticed to have some occasional cough she really feels like it is mostly just drainage but did want to mention it.  More recently she has had some pain with swallowing in her mid chest.  She has had an esophageal stricture before.  Her previous gastroenterologist Marney Setting has retired.    ROS    Objective:     BP (!) 128/51 (BP Location: Left Arm, Patient Position: Sitting, Cuff Size: Normal)   Pulse 87   Ht 5\' 2"  (1.575 m)   Wt 167 lb 12 oz (76.1 kg)   SpO2 98%   BMI 30.68 kg/m    Physical Exam Vitals and nursing note reviewed.  Constitutional:      Appearance: Normal appearance.  HENT:     Head: Normocephalic and atraumatic.  Eyes:     Conjunctiva/sclera: Conjunctivae normal.  Cardiovascular:     Rate and Rhythm: Normal rate and regular rhythm.  Pulmonary:     Effort: Pulmonary effort is normal.     Breath sounds: Normal breath sounds.  Skin:    General: Skin is warm and dry.  Neurological:     Mental Status: She is alert.  Psychiatric:        Mood  and Affect: Mood normal.      Results for orders placed or performed in visit on 10/16/23  POCT HgB A1C  Result Value Ref Range   Hemoglobin A1C     HbA1c POC (<> result, manual entry)     HbA1c, POC (prediabetic range)     HbA1c, POC (controlled diabetic range) 6.1 0.0 - 7.0 %      The 10-year ASCVD risk score (Arnett DK, et al., 2019) is: 40.8%    Assessment & Plan:   Problem List Items Addressed This Visit       Cardiovascular and Mediastinum   Essential hypertension, benign   Pressure at goal continue current regimen.  Due for updated labs.  Follow-up in 6 months.      Relevant Orders   CMP14+EGFR   Lipid panel   CBC     Endocrine   Controlled type 2 diabetes mellitus without complication, without long-term current use of insulin (HCC) - Primary   1C continues to look fantastic with just dietary management.  6.1 today plan to recheck again in 6 months.      Relevant Orders   POCT HgB A1C (Completed)   CMP14+EGFR   Lipid panel   CBC     Other  Primary insomnia   Relevant Medications   zolpidem (AMBIEN) 5 MG tablet   MDD (major depressive disorder), recurrent, in partial remission (HCC)   Continue current regimen with Lexapr. otherwise happy with current dose of regimen just some concern for weight gain.  We discussed putting some focus on weight loss strategies first for 60 to 90 days but we can always circle back and make an adjustment on the Lexapro if needed.  Continue to use alprazolam sparingly.      Relevant Medications   escitalopram (LEXAPRO) 10 MG tablet   Disturbance in sleep behavior   Will refill Ambien.       Relevant Medications   zolpidem (AMBIEN) 5 MG tablet   Class 1 obesity with serious comorbidity and body mass index (BMI) of 30.0 to 30.9 in adult   Discussed options including starting with a app such as my fitness pal or lose it to help track calories and set goals.  Working on cutting back on carbs and increasing vegetable intake  and getting moving more often.  We could always look at adjusting the Lexapro if needed.      Other Visit Diagnoses       Depression with anxiety       Relevant Medications   escitalopram (LEXAPRO) 10 MG tablet     Esophageal dysphagia           Dysphagia-recommend she get back in with GI she says she will call herself and also happy to place referral if needed.  She may even want to do a trial of a PPI while she is waiting to get in and could also be esophagitis but I am concerned that her esophageal stricture may be back.  Return in about 6 months (around 04/15/2024) for Mood.    Nani Gasser, MD

## 2023-10-16 NOTE — Assessment & Plan Note (Signed)
Pressure at goal continue current regimen.  Due for updated labs.  Follow-up in 6 months.

## 2023-10-16 NOTE — Assessment & Plan Note (Signed)
Will refill Ambien 

## 2023-10-16 NOTE — Assessment & Plan Note (Signed)
Continue current regimen with Lexapr. otherwise happy with current dose of regimen just some concern for weight gain.  We discussed putting some focus on weight loss strategies first for 60 to 90 days but we can always circle back and make an adjustment on the Lexapro if needed.  Continue to use alprazolam sparingly.

## 2023-10-16 NOTE — Assessment & Plan Note (Signed)
1C continues to look fantastic with just dietary management.  6.1 today plan to recheck again in 6 months.

## 2023-10-16 NOTE — Assessment & Plan Note (Signed)
Discussed options including starting with a app such as my fitness pal or lose it to help track calories and set goals.  Working on cutting back on carbs and increasing vegetable intake and getting moving more often.  We could always look at adjusting the Lexapro if needed.

## 2023-10-17 DIAGNOSIS — E119 Type 2 diabetes mellitus without complications: Secondary | ICD-10-CM | POA: Diagnosis not present

## 2023-10-17 DIAGNOSIS — I10 Essential (primary) hypertension: Secondary | ICD-10-CM | POA: Diagnosis not present

## 2023-10-17 MED ORDER — ZOLPIDEM TARTRATE 5 MG PO TABS
5.0000 mg | ORAL_TABLET | Freq: Every evening | ORAL | 0 refills | Status: DC | PRN
Start: 2023-10-17 — End: 2024-01-23

## 2023-10-18 LAB — CMP14+EGFR
ALT: 11 [IU]/L (ref 0–32)
AST: 13 [IU]/L (ref 0–40)
Albumin: 4.2 g/dL (ref 3.8–4.8)
Alkaline Phosphatase: 38 [IU]/L — ABNORMAL LOW (ref 44–121)
BUN/Creatinine Ratio: 25 (ref 12–28)
BUN: 18 mg/dL (ref 8–27)
Bilirubin Total: 1 mg/dL (ref 0.0–1.2)
CO2: 24 mmol/L (ref 20–29)
Calcium: 9.5 mg/dL (ref 8.7–10.3)
Chloride: 102 mmol/L (ref 96–106)
Creatinine, Ser: 0.71 mg/dL (ref 0.57–1.00)
Globulin, Total: 2.5 g/dL (ref 1.5–4.5)
Glucose: 97 mg/dL (ref 70–99)
Potassium: 5 mmol/L (ref 3.5–5.2)
Sodium: 140 mmol/L (ref 134–144)
Total Protein: 6.7 g/dL (ref 6.0–8.5)
eGFR: 88 mL/min/{1.73_m2} (ref >59)

## 2023-10-18 LAB — CBC
Hematocrit: 40.6 % (ref 34.0–46.6)
Hemoglobin: 13.2 g/dL (ref 11.1–15.9)
MCH: 31.1 pg (ref 26.6–33.0)
MCHC: 32.5 g/dL (ref 31.5–35.7)
MCV: 96 fL (ref 79–97)
Platelets: 191 10*3/uL (ref 150–450)
RBC: 4.25 x10E6/uL (ref 3.77–5.28)
RDW: 12.3 % (ref 11.7–15.4)
WBC: 5.3 10*3/uL (ref 3.4–10.8)

## 2023-10-18 LAB — LIPID PANEL
Chol/HDL Ratio: 2.5 {ratio} (ref 0.0–4.4)
Cholesterol, Total: 198 mg/dL (ref 100–199)
HDL: 80 mg/dL (ref >39)
LDL Chol Calc (NIH): 107 mg/dL — ABNORMAL HIGH (ref 0–99)
Triglycerides: 61 mg/dL (ref 0–149)
VLDL Cholesterol Cal: 11 mg/dL (ref 5–40)

## 2023-10-18 NOTE — Progress Notes (Signed)
Hi Jasmine Fitzgerald, alkaline phosphatase still just a little borderline but that is really where you have been running for about the last 4+ years.  Additional liver enzymes are normal.  LDL is just borderline elevated continue to work on healthy food choices and regular exercise blood count is normal.

## 2023-11-09 DIAGNOSIS — H5213 Myopia, bilateral: Secondary | ICD-10-CM | POA: Diagnosis not present

## 2023-11-09 DIAGNOSIS — H16103 Unspecified superficial keratitis, bilateral: Secondary | ICD-10-CM | POA: Diagnosis not present

## 2023-11-09 DIAGNOSIS — H35373 Puckering of macula, bilateral: Secondary | ICD-10-CM | POA: Diagnosis not present

## 2023-11-09 DIAGNOSIS — H35362 Drusen (degenerative) of macula, left eye: Secondary | ICD-10-CM | POA: Diagnosis not present

## 2023-11-09 DIAGNOSIS — H5051 Esophoria: Secondary | ICD-10-CM | POA: Diagnosis not present

## 2023-11-09 DIAGNOSIS — K219 Gastro-esophageal reflux disease without esophagitis: Secondary | ICD-10-CM | POA: Diagnosis not present

## 2023-11-09 DIAGNOSIS — Z961 Presence of intraocular lens: Secondary | ICD-10-CM | POA: Diagnosis not present

## 2023-11-09 DIAGNOSIS — R1310 Dysphagia, unspecified: Secondary | ICD-10-CM | POA: Diagnosis not present

## 2023-11-16 ENCOUNTER — Encounter: Payer: Self-pay | Admitting: Family Medicine

## 2023-11-16 ENCOUNTER — Ambulatory Visit: Payer: Medicare Other | Admitting: Family Medicine

## 2023-11-16 VITALS — Ht 62.0 in | Wt 165.0 lb

## 2023-11-16 DIAGNOSIS — Z0000 Encounter for general adult medical examination without abnormal findings: Secondary | ICD-10-CM | POA: Diagnosis not present

## 2023-11-16 NOTE — Progress Notes (Signed)
Subjective:   Jasmine Fitzgerald is a 78 y.o. female who presents for Medicare Annual (Subsequent) preventive examination.  Visit Complete: Virtual I connected with  Tomeca R Allcock on 11/16/23 by a audio enabled telemedicine application and verified that I am speaking with the correct person using two identifiers.  Patient Location: Home  Provider Location: Office/Clinic  I discussed the limitations of evaluation and management by telemedicine. The patient expressed understanding and agreed to proceed.  Vital Signs: Because this visit was a virtual/telehealth visit, some criteria may be missing or patient reported. Any vitals not documented were not able to be obtained and vitals that have been documented are patient reported.  Patient Medicare AWV questionnaire was completed by the patient on n/a ; I have confirmed that all information answered by patient is correct and no changes since this date.  Cardiac Risk Factors include: advanced age (>38men, >73 women);obesity (BMI >30kg/m2);hypertension     Objective:    Today's Vitals   11/16/23 1033  Weight: 165 lb (74.8 kg)  Height: 5\' 2"  (1.575 m)   Body mass index is 30.18 kg/m.     11/16/2023   10:44 AM 10/18/2022   10:27 AM 10/15/2021    2:08 PM 03/27/2018   10:38 AM 05/31/2016    8:49 AM 02/15/2016   11:55 AM  Advanced Directives  Does Patient Have a Medical Advance Directive? Yes Yes Yes Yes Yes Yes  Type of Estate agent of [place];Living will Living will Living will;Healthcare Power of [employer] Power of [place];Living will Healthcare Power of [place];Living will Healthcare Power of [place];Living will;Out of facility DNR (pink MOST or yellow form)  Does patient want to make changes to medical advance directive? No - Patient declined No - Patient declined No - Patient declined   No - Patient declined  Copy of Healthcare Power of Attorney in Chart?   No - copy requested No - copy requested No -  copy requested No - copy requested    Current Medications (verified) Outpatient Encounter Medications as of 11/16/2023  Medication Sig   ALPRAZolam (XANAX) 0.25 MG tablet Take 1 tablet (0.25 mg total) by mouth daily as needed for anxiety. Do not mix with Ambien   AMBULATORY NON FORMULARY MEDICATION Medication Name: Test strips.  Check glucose 2 times per week. #100.  Dx: Diabetes   aspirin 81 MG tablet Take by mouth.   blood glucose meter kit and supplies KIT Dispense based on patient and insurance preference. Use up to once daily as directed. (FOR ICD-9 250.00, 250.01).   celecoxib (CELEBREX) 200 MG capsule Take 1 capsule (200 mg total) by mouth 2 (two) times daily.   cetirizine (ZYRTEC) 10 MG tablet Take 10-20 mg by mouth daily. As needed.   Cholecalciferol (VITAMIN D3) 25 MCG (1000 UT) CAPS Take 2 capsules by mouth daily.   cyanocobalamin 100 MCG tablet Take 100 mcg by mouth daily.   escitalopram (LEXAPRO) 10 MG tablet Take 1 tablet (10 mg total) by mouth daily.   glucose blood (TRUE METRIX BLOOD GLUCOSE TEST) test strip USE UP TO ONCE DAILY AS DIRECTED   losartan (COZAAR) 25 MG tablet Take 1 tablet (25 mg total) by mouth daily.   RESTASIS 0.05 % ophthalmic emulsion 1 drop 2 (two) times daily.   Turmeric (CURCUMIN 95 PO) Take 3 tablets by mouth daily.   zolpidem (AMBIEN) 5 MG tablet Take 1 tablet (5 mg total) by mouth at bedtime as needed for sleep.   zinc  gluconate 50 MG tablet Take 50 mg by mouth daily. (Patient not taking: Reported on 11/16/2023)   No facility-administered encounter medications on file as of 11/16/2023.    Allergies (verified) Other, Tape, Codeine, and Hydrocodone   History: Past Medical History:  Diagnosis Date   Carpal tunnel syndrome    Colon polyp    Hypertension    Kidney stones    Renal cyst    benign   Past Surgical History:  Procedure Laterality Date   ABDOMINAL SURGERY  12/16/2016   Pancreaticoduodenal aortic aneurysm repair   BREAST REDUCTION  SURGERY  2013   CATARACT EXTRACTION W/ INTRAOCULAR LENS IMPLANT Bilateral 05/2022   HERNIA REPAIR  1996   KIDNEY STONE SURGERY  2015   KNEE ARTHROSCOPY     TONSILLECTOMY  1957   TRIGGER FINGER RELEASE  07/2015   UPPER GASTROINTESTINAL ENDOSCOPY     Family History  Problem Relation Age of Onset   Heart failure Mother    Heart failure Father    Heart attack Father    Diabetes Brother    Kidney failure Brother    Skin cancer Brother        severe   Cancer Maternal Aunt    Cancer Maternal Uncle    Stroke Maternal Grandmother    Breast cancer Cousin    Ovarian cancer Other    Lung cancer Other    Throat cancer Other    Social History   Socioeconomic History   Marital status: Married    Spouse name: Deniece Portela   Number of children: 3   Years of education: 12   Highest education level: 12th grade  Occupational History   Occupation: Retired  Tobacco Use   Smoking status: Never   Smokeless tobacco: Never  Vaping Use   Vaping status: Never Used  Substance and Sexual Activity   Alcohol use: Not Currently    Comment: occassionally   Drug use: No   Sexual activity: Not Currently    Birth control/protection: Surgical  Other Topics Concern   Not on file  Social History Narrative   Retired. Married to Gail. 3 adult children. Currently lives with her spouse. She enjoys yardwork, decorate and making flower arrangement.   Social Drivers of Corporate investment banker Strain: Low Risk  (11/16/2023)   Overall Financial Resource Strain (CARDIA)    Difficulty of Paying Living Expenses: Not hard at all  Food Insecurity: No Food Insecurity (11/16/2023)   Hunger Vital Sign    Worried About Running Out of Food in the Last Year: Never true    Ran Out of Food in the Last Year: Never true  Transportation Needs: No Transportation Needs (11/16/2023)   PRAPARE - Administrator, Civil Service (Medical): No    Lack of Transportation (Non-Medical): No  Physical Activity: Sufficiently  Active (11/16/2023)   Exercise Vital Sign    Days of Exercise per Week: 3 days    Minutes of Exercise per Session: 60 min  Stress: No Stress Concern Present (11/16/2023)   [employer] of Occupational Health - Occupational Stress Questionnaire    Feeling of Stress : Only a little  Social Connections: Socially Integrated (11/16/2023)   Social Connection and Isolation Panel [NHANES]    Frequency of Communication with Friends and Family: More than three times a week    Frequency of Social Gatherings with Friends and Family: Twice a week    Attends Religious Services: More than 4 times per year  Active Member of Clubs or Organizations: Yes    Attends Engineer, structural: More than 4 times per year    Marital Status: Married    Tobacco Counseling Counseling given: Not Answered   Clinical Intake:  Pre-visit preparation completed: No  Pain : No/denies pain     BMI - recorded: 30.2 Nutritional Status: BMI > 30  Obese Nutritional Risks: None Diabetes: No  How often do you need to have someone help you when you read instructions, pamphlets, or other written materials from your doctor or pharmacy?: 1 - Never What is the last grade level you completed in school?: 13  Interpreter Needed?: No      Activities of Daily Living    11/16/2023   10:34 AM  In your present state of health, do you have any difficulty performing the following activities:  Hearing? 1  Comment wears bilatral hearing aids  Vision? 0  Difficulty concentrating or making decisions? 0  Walking or climbing stairs? 0  Dressing or bathing? 0  Doing errands, shopping? 0  Preparing Food and eating ? N  Using the Toilet? N  In the past six months, have you accidently leaked urine? Y  Do you have problems with loss of bowel control? N  Managing your Medications? N  Managing your Finances? N  Housekeeping or managing your Housekeeping? N    Patient Care Team: Agapito Games, MD as PCP -  General (Family Medicine) Janene Madeira, MD as Referring Physician (Vascular Surgery) Tilda Burrow, MD as Referring Physician (Gastroenterology) Tanya Nones, OD as Referring Physician (Optometry) Karie Mainland, MD Dermatology     Indicate any recent Medical Services you may have received from other than [facility] providers in the past year (date may be approximate).     Assessment:   This is a routine wellness examination for Jasmine Fitzgerald.  Hearing/Vision screen Hearing Screening - Comments:: Wears hearing aid Vision Screening - Comments:: Wears glasses to drive, no issues.   Goals Addressed             This Visit's Progress    Set My Weight Loss Goal       Would like to weigh 142 pounds.       Depression Screen    11/16/2023   10:43 AM 11/16/2023   10:42 AM 06/14/2023   11:13 AM 03/13/2023    2:23 PM 10/18/2022   10:27 AM 10/15/2021    2:09 PM 07/13/2021   10:35 AM  PHQ 2/9 Scores  PHQ - 2 Score 0 0 0 1 0 0 0  PHQ- 9 Score   2 6   1     Fall Risk    11/16/2023   10:45 AM 10/16/2023   10:36 AM 06/14/2023   11:13 AM 06/14/2023   10:46 AM 11/15/2022    3:03 PM  Fall Risk   Falls in the past year? 0 0 1 1 0  Number falls in past yr: 0 0 0 0 0  Injury with Fall? 0 0 0 0 0  Risk for fall due to : No Fall Risks No Fall Risks No Fall Risks No Fall Risks No Fall Risks  Follow up   Falls evaluation completed Falls evaluation completed Falls evaluation completed    MEDICARE RISK AT HOME: Medicare Risk at Home Any stairs in or around the home?: Yes If so, are there any without handrails?: Yes Home free of loose throw rugs in walkways, pet beds, electrical cords, etc?: Yes Adequate lighting  in your home to reduce risk of falls?: Yes Life alert?: No Use of a cane, walker or w/c?: No Grab bars in the bathroom?: No Shower chair or bench in shower?: Yes Elevated toilet seat or a handicapped toilet?: Yes  TIMED UP AND GO:  Was the test performed?  No    Cognitive Function:         11/16/2023   10:46 AM 10/18/2022   10:34 AM 10/15/2021    2:18 PM 03/27/2018   10:53 AM 03/14/2017   10:39 AM  6CIT Screen  What Year? 0 points 0 points 0 points 0 points 0 points  What month? 0 points 0 points 0 points 0 points 0 points  What time? 0 points 0 points 0 points 0 points 0 points  Count back from 20 0 points 0 points 0 points 0 points 0 points  Months in reverse 2 points 0 points 0 points 0 points 0 points  Repeat phrase 0 points 0 points 0 points 0 points 0 points  Total Score 2 points 0 points 0 points 0 points 0 points    Immunizations Immunization History  Administered Date(s) Administered   Fluad Quad(high Dose 65+) 07/09/2020, 07/13/2021, 07/18/2022   Influenza Split 07/02/2015   Influenza, High Dose Seasonal PF 07/10/2017, 08/07/2018, 08/03/2019   Influenza, Seasonal, Injecte, Preservative Fre 08/08/2013, 07/28/2014   Influenza,inj,Quad PF,6+ Mos 08/08/2013, 07/02/2015, 07/18/2016   Influenza,inj,quad, With Preservative 07/28/2014   Moderna Sars-Covid-2 Vaccination 11/25/2019, 12/23/2019, 09/19/2020   Pneumococcal Conjugate-13 02/02/2015   Pneumococcal Polysaccharide-23 10/31/2012   Tdap 09/11/2008, 10/31/2008, 05/07/2019   Zoster Recombinant(Shingrix) 03/14/2017, 01/19/2018   Zoster, Live 05/06/2009, 10/31/2012    TDAP status: Up to date  Flu Vaccine status: Up to date  Pneumococcal vaccine status: Up to date  Covid-19 vaccine status: Declined, Education has been provided regarding the importance of this vaccine but patient still declined. Advised may receive this vaccine at local pharmacy or Health Dept.or vaccine clinic. Aware to provide a copy of the vaccination record if obtained from local pharmacy or Health Dept. Verbalized acceptance and understanding.  Qualifies for Shingles Vaccine? Yes   Zostavax completed Yes   Shingrix Completed?: Yes  Screening Tests Health Maintenance  Topic Date Due   OPHTHALMOLOGY EXAM  05/24/2023   COVID-19  Vaccine (4 - 2024-25 season) 07/02/2023   INFLUENZA VACCINE  01/29/2024 (Originally 06/01/2023)   FOOT EXAM  02/13/2024   Diabetic kidney evaluation - Urine ACR  03/12/2024   HEMOGLOBIN A1C  04/15/2024   MAMMOGRAM  05/16/2024   Diabetic kidney evaluation - eGFR measurement  10/16/2024   Medicare Annual Wellness (AWV)  11/15/2024   DTaP/Tdap/Td (4 - Td or Tdap) 05/06/2029   Pneumonia Vaccine 51+ Years old  Completed   DEXA SCAN  Completed   Hepatitis C Screening  Completed   Zoster Vaccines- Shingrix  Completed   HPV VACCINES  Aged Out   Colonoscopy  Discontinued    Health Maintenance  Health Maintenance Due  Topic Date Due   OPHTHALMOLOGY EXAM  05/24/2023   COVID-19 Vaccine (4 - 2024-25 season) 07/02/2023    Colorectal cancer screening: Type of screening: Colonoscopy. Completed 01/06/2020. Repeat every 5 years  Mammogram status: Completed 05/16/2020. Repeat every year  Bone Density status: Completed 10/17/2021. Results reflect: Bone density results: OSTEOPENIA. Repeat every 2-3 years.  Lung Cancer Screening: (Low Dose CT Chest recommended if Age 92-80 years, 20 pack-year currently smoking OR have quit w/in 15years.) does not qualify.   Lung Cancer Screening Referral:  n/a  Additional Screening:  Hepatitis C Screening: does qualify; Completed 05/07/2019  Vision Screening: Recommended annual ophthalmology exams for early detection of glaucoma and other disorders of the eye. Is the patient up to date with their annual eye exam?  Yes  Who is the provider or what is the name of the office in which the patient attends annual eye exams? Jackquline Bosch, MD  If pt is not established with a provider, would they like to be referred to a provider to establish care? No .   Dental Screening: Recommended annual dental exams for proper oral hygiene  Diabetic Foot Exam: Diabetic Foot Exam: Completed 02/13/2023  Community Resource Referral / Chronic Care Management: CRR required this visit?  No    CCM required this visit?  No     Plan:     I have personally reviewed and noted the following in the patient's chart:   Medical and social history Use of alcohol, tobacco or illicit drugs  Current medications and supplements including opioid prescriptions. Patient is not currently taking opioid prescriptions. Functional ability and status Nutritional status Physical activity Advanced directives List of other physicians Hospitalizations, surgeries, and ER visits in previous 12 months: none  Vitals Screenings to include cognitive, depression, and falls Referrals and appointments: none   In addition, I have reviewed and discussed with patient certain preventive protocols, quality metrics, and best practice recommendations. A written personalized care plan for preventive services as well as general preventive health recommendations were provided to patient.     Novella Olive, FNP   11/16/2023   After Visit Summary: (MyChart) Due to this being a telephonic visit, the after visit summary with patients personalized plan was offered to patient via MyChart   Follow-up with PCP as scheduled.  Follow-up with GI as scheduled.

## 2024-01-23 ENCOUNTER — Other Ambulatory Visit: Payer: Self-pay | Admitting: Family Medicine

## 2024-01-23 DIAGNOSIS — F5101 Primary insomnia: Secondary | ICD-10-CM

## 2024-01-23 DIAGNOSIS — G479 Sleep disorder, unspecified: Secondary | ICD-10-CM

## 2024-01-28 ENCOUNTER — Other Ambulatory Visit: Payer: Self-pay | Admitting: Family Medicine

## 2024-01-28 DIAGNOSIS — I10 Essential (primary) hypertension: Secondary | ICD-10-CM

## 2024-01-29 DIAGNOSIS — K219 Gastro-esophageal reflux disease without esophagitis: Secondary | ICD-10-CM | POA: Diagnosis not present

## 2024-01-29 DIAGNOSIS — R1310 Dysphagia, unspecified: Secondary | ICD-10-CM | POA: Diagnosis not present

## 2024-01-31 DIAGNOSIS — M7662 Achilles tendinitis, left leg: Secondary | ICD-10-CM | POA: Diagnosis not present

## 2024-02-06 DIAGNOSIS — H35373 Puckering of macula, bilateral: Secondary | ICD-10-CM | POA: Diagnosis not present

## 2024-02-06 DIAGNOSIS — H00024 Hordeolum internum left upper eyelid: Secondary | ICD-10-CM | POA: Diagnosis not present

## 2024-02-06 DIAGNOSIS — H35362 Drusen (degenerative) of macula, left eye: Secondary | ICD-10-CM | POA: Diagnosis not present

## 2024-02-06 DIAGNOSIS — H5213 Myopia, bilateral: Secondary | ICD-10-CM | POA: Diagnosis not present

## 2024-02-06 DIAGNOSIS — H5051 Esophoria: Secondary | ICD-10-CM | POA: Diagnosis not present

## 2024-02-06 DIAGNOSIS — Z961 Presence of intraocular lens: Secondary | ICD-10-CM | POA: Diagnosis not present

## 2024-02-06 DIAGNOSIS — H16103 Unspecified superficial keratitis, bilateral: Secondary | ICD-10-CM | POA: Diagnosis not present

## 2024-02-19 DIAGNOSIS — H5213 Myopia, bilateral: Secondary | ICD-10-CM | POA: Diagnosis not present

## 2024-02-19 DIAGNOSIS — H5051 Esophoria: Secondary | ICD-10-CM | POA: Diagnosis not present

## 2024-02-19 DIAGNOSIS — H35362 Drusen (degenerative) of macula, left eye: Secondary | ICD-10-CM | POA: Diagnosis not present

## 2024-02-19 DIAGNOSIS — Z961 Presence of intraocular lens: Secondary | ICD-10-CM | POA: Diagnosis not present

## 2024-02-19 DIAGNOSIS — H35373 Puckering of macula, bilateral: Secondary | ICD-10-CM | POA: Diagnosis not present

## 2024-02-19 DIAGNOSIS — H16103 Unspecified superficial keratitis, bilateral: Secondary | ICD-10-CM | POA: Diagnosis not present

## 2024-02-28 DIAGNOSIS — M7662 Achilles tendinitis, left leg: Secondary | ICD-10-CM | POA: Diagnosis not present

## 2024-03-08 DIAGNOSIS — M25572 Pain in left ankle and joints of left foot: Secondary | ICD-10-CM | POA: Diagnosis not present

## 2024-03-15 DIAGNOSIS — M76822 Posterior tibial tendinitis, left leg: Secondary | ICD-10-CM | POA: Diagnosis not present

## 2024-03-30 ENCOUNTER — Other Ambulatory Visit: Payer: Self-pay | Admitting: Family Medicine

## 2024-03-30 DIAGNOSIS — F418 Other specified anxiety disorders: Secondary | ICD-10-CM

## 2024-04-01 NOTE — Telephone Encounter (Signed)
 Holf for 6/16 appt with [facility]

## 2024-04-11 DIAGNOSIS — L986 Other infiltrative disorders of the skin and subcutaneous tissue: Secondary | ICD-10-CM | POA: Diagnosis not present

## 2024-04-11 DIAGNOSIS — L300 Nummular dermatitis: Secondary | ICD-10-CM | POA: Diagnosis not present

## 2024-04-11 DIAGNOSIS — D485 Neoplasm of uncertain behavior of skin: Secondary | ICD-10-CM | POA: Diagnosis not present

## 2024-04-12 ENCOUNTER — Telehealth: Payer: Self-pay

## 2024-04-12 NOTE — Telephone Encounter (Signed)
 Lvm asking that she rtn call to discuss

## 2024-04-12 NOTE — Telephone Encounter (Signed)
 Copied from CRM [id number]. Topic: Clinical - Medication Question >> Apr 12, 2024  8:51 AM Blair Bumpers wrote: Reason for CRM: Patient's daughter, Donald Frost, is requesting that a nurse or Dr. Greer Leak give her a call. She states she needs to speak with nurse or provider in regards to her mother's medication and to let them know what's going on with it. States she's afraid her mother will not let them know at her upcoming appt on Monday, June 16th. Please give her a call back. CB #: [phone].

## 2024-04-15 ENCOUNTER — Ambulatory Visit (INDEPENDENT_AMBULATORY_CARE_PROVIDER_SITE_OTHER): Payer: Medicare Other | Admitting: Family Medicine

## 2024-04-15 ENCOUNTER — Telehealth: Payer: Self-pay

## 2024-04-15 ENCOUNTER — Encounter: Payer: Self-pay | Admitting: Family Medicine

## 2024-04-15 VITALS — BP 130/61 | HR 66 | Ht 62.0 in | Wt 154.5 lb

## 2024-04-15 DIAGNOSIS — I10 Essential (primary) hypertension: Secondary | ICD-10-CM

## 2024-04-15 DIAGNOSIS — G479 Sleep disorder, unspecified: Secondary | ICD-10-CM | POA: Diagnosis not present

## 2024-04-15 DIAGNOSIS — F3341 Major depressive disorder, recurrent, in partial remission: Secondary | ICD-10-CM | POA: Diagnosis not present

## 2024-04-15 DIAGNOSIS — F5101 Primary insomnia: Secondary | ICD-10-CM | POA: Diagnosis not present

## 2024-04-15 DIAGNOSIS — E119 Type 2 diabetes mellitus without complications: Secondary | ICD-10-CM

## 2024-04-15 LAB — POCT GLYCOSYLATED HEMOGLOBIN (HGB A1C)
HbA1c POC (<> result, manual entry): 5.6 % (ref 4.0–5.6)
HbA1c, POC (controlled diabetic range): 5.6 % (ref 0.0–7.0)
Hemoglobin A1C: 5.6 % (ref 4.0–5.6)

## 2024-04-15 MED ORDER — ALPRAZOLAM 0.25 MG PO TABS: 0.2500 mg | ORAL_TABLET | Freq: Every day | ORAL | 0 refills | Status: AC | PRN

## 2024-04-15 MED ORDER — CELECOXIB 200 MG PO CAPS: 200.0000 mg | ORAL_CAPSULE | Freq: Every day | ORAL | 3 refills | Status: AC

## 2024-04-15 MED ORDER — ZOLPIDEM TARTRATE 5 MG PO TABS: 2.5000 mg | ORAL_TABLET | Freq: Every day | ORAL | 1 refills | Status: DC | PRN

## 2024-04-15 NOTE — Assessment & Plan Note (Signed)
 Particularly stressed with her husband's health over the last 6 months and him not wanting to engage because of it.  But she is otherwise doing okay continue Lexapro  10 mg daily.  She rarely rarely uses her Xanax  we did send in 30 tabs back in September and she still has 1 tab left but does need refills today.

## 2024-04-15 NOTE — Assessment & Plan Note (Signed)
 Pressure looks good today.  Normally it is in the 120s but still looks good.  Continue current regimen.

## 2024-04-15 NOTE — Telephone Encounter (Signed)
 Attempted call to Cathleen Coach- patient's daughter. Left a voice mail message requesting a return call.

## 2024-04-15 NOTE — Assessment & Plan Note (Signed)
 Still on Ambien  5 mg she says most nights it works occasionally she will still have a night where she cannot fall asleep well she will be due for refill next week so glad and send that today.

## 2024-04-15 NOTE — Telephone Encounter (Signed)
 Spoke with patient daughter Cathleen Coach - call back # [phone] - o.k. to leave a detailed voice mail message . She is requesting that mother not be informed that she called regarding her mother's medication.   She states that her mother has been more irritable, anxious and mood does not seem to be leveling out.  Her father has been sick as well She does not feel the Lexapro  has been working for her .  She also states he mother will tell her that she forgets to take it some days . She also takes this medication at night and she thinks it is suppose to be taken at daily  She is wanting Dr. Greer Leak  thoughts on this  Should med be changed ( back to Prozac  or something else)  Just wanted to see Dr. Trenia Fritter thoughts on this.

## 2024-04-15 NOTE — Progress Notes (Signed)
 Established Patient Office Visit  Subjective  Patient ID: Jasmine Fitzgerald, female    DOB: December 17, 1945  Age: 78 y.o. MRN: [id number]  Chief Complaint  Patient presents with   Medical Management of Chronic Issues    22-month follow up; has been under a lot of stress due to spouse being sick for the past 10 months.    HPI   73-month follow up; has been under a lot of stress due to spouse being sick for the past 10 months.  He has had some health concerns and has been more anxious and fearful since then.  So a lot has been on her shoulders to do.  Her daughters have been really helpful and supportive which has been great.    She currently has alprazolam  to take as needed.  She uses it rarely she is also on escitalopram  10 mg daily.  He does take 5 mg of Ambien  at night to help her sleep.  She would like a refill on her Celebrex .  Prescriptions written for twice a day but she says she pretty much just takes to once a day and if she is having an occasional really good day, she might even skip her dose.    ROS    Objective:     BP 130/61   Pulse 66   Ht 5' 2 (1.575 m)   Wt 154 lb 8 oz (70.1 kg)   SpO2 95%   BMI 28.26 kg/m    Physical Exam Vitals and nursing note reviewed.  Constitutional:      Appearance: Normal appearance.  HENT:     Head: Normocephalic and atraumatic.   Eyes:     Conjunctiva/sclera: Conjunctivae normal.    Cardiovascular:     Rate and Rhythm: Normal rate and regular rhythm.  Pulmonary:     Effort: Pulmonary effort is normal.     Breath sounds: Normal breath sounds.   Skin:    General: Skin is warm and dry.   Neurological:     Mental Status: She is alert.   Psychiatric:        Mood and Affect: Mood normal.      Results for orders placed or performed in visit on 04/15/24  POCT HgB A1C  Result Value Ref Range   Hemoglobin A1C 5.6 4.0 - 5.6 %   HbA1c POC (<> result, manual entry) 5.6 4.0 - 5.6 %   HbA1c, POC (prediabetic range)     HbA1c,  POC (controlled diabetic range) 5.6 0.0 - 7.0 %      The 10-year ASCVD risk score (Arnett DK, et al., 2019) is: 41.8%    Assessment & Plan:   Problem List Items Addressed This Visit       Cardiovascular and Mediastinum   Essential hypertension, benign   Pressure looks good today.  Normally it is in the 120s but still looks good.  Continue current regimen.      Relevant Orders   Basic Metabolic Panel (BMET)     Endocrine   Controlled type 2 diabetes mellitus without complication, without long-term current use of insulin (HCC) - Primary   Lab Results  Component Value Date   HGBA1C 5.6 04/15/2024   HGBA1C 5.6 04/15/2024   HGBA1C 5.6 04/15/2024   1C looks great today at 5.6.  Absolutely phenomenal.  She did let me know that she is also taking semaglutide compounded to help her lose weight as she is gena be in a wedding for the fall  she says she is already lost about 14 pounds      Relevant Orders   POCT HgB A1C (Completed)   Urine Microalbumin w/creat. ratio   Basic Metabolic Panel (BMET)     Other   Primary insomnia   Still on Ambien  5 mg she says most nights it works occasionally she will still have a night where she cannot fall asleep well she will be due for refill next week so glad and send that today.      Relevant Medications   zolpidem  (AMBIEN ) 5 MG tablet   MDD (major depressive disorder), recurrent, in partial remission (HCC)   Particularly stressed with her husband's health over the last 6 months and him not wanting to engage because of it.  But she is otherwise doing okay continue Lexapro  10 mg daily.  She rarely rarely uses her Xanax  we did send in 30 tabs back in September and she still has 1 tab left but does need refills today.      Relevant Medications   ALPRAZolam  (XANAX ) 0.25 MG tablet   Disturbance in sleep behavior   Relevant Medications   zolpidem  (AMBIEN ) 5 MG tablet    Return in about 5 months (around 09/15/2024) for Diabetes follow-up.     Duaine German, MD

## 2024-04-15 NOTE — Telephone Encounter (Signed)
 It is located to the medication at night if that is her pattern but most people do take it in the morning.  Some people do move it to nighttime if it tends to make them a little bit more sleepy.  In regards to changing the medications back the patient would have to make that decision.  She did discuss today that she was feeling a little stressed in regards to her husband's situation but that she was getting a lot of family support.

## 2024-04-15 NOTE — Assessment & Plan Note (Signed)
 Lab Results  Component Value Date   HGBA1C 5.6 04/15/2024   HGBA1C 5.6 04/15/2024   HGBA1C 5.6 04/15/2024   1C looks great today at 5.6.  Absolutely phenomenal.  She did let me know that she is also taking semaglutide compounded to help her lose weight as she is gena be in a wedding for the fall she says she is already lost about 14 pounds

## 2024-04-15 NOTE — Telephone Encounter (Signed)
 Copied from CRM [id number]. Topic: Clinical - Medication Question >> Apr 12, 2024  8:51 AM Blair Bumpers wrote: Reason for CRM: Patient's daughter, Donald Frost, is requesting that a nurse or Dr. Greer Leak give her a call. She states she needs to speak with nurse or provider in regards to her mother's medication and to let them know what's going on with it. States she's afraid her mother will not let them know at her upcoming appt on Monday, June 16th. Please give her a call back. CB #: [phone]. >> Apr 15, 2024  9:16 AM Blair Bumpers wrote: Patient's daughter, Cathleen Coach, called in wanting to see if she could speak with Dr. Greer Leak before her mother's appt today. She states she DOES NOT want her mom to know that she's calling. Cathleen Coach states she doesn't think her mom's anti-depressant medication is really working for her mom. She wants to know if Dr. Greer Leak can prescribe something else like Celexa or something that may work for her. Please have Dr. Greer Leak to give Cathleen Coach a call back at [phone], if possible before her mother's appt today, 04/15/24.

## 2024-04-16 ENCOUNTER — Ambulatory Visit: Payer: Self-pay | Admitting: Family Medicine

## 2024-04-16 LAB — BASIC METABOLIC PANEL WITH GFR
BUN/Creatinine Ratio: 23 (ref 12–28)
BUN: 14 mg/dL (ref 8–27)
CO2: 22 mmol/L (ref 20–29)
Calcium: 9.4 mg/dL (ref 8.7–10.3)
Chloride: 102 mmol/L (ref 96–106)
Creatinine, Ser: 0.6 mg/dL (ref 0.57–1.00)
Glucose: 80 mg/dL (ref 70–99)
Potassium: 4.2 mmol/L (ref 3.5–5.2)
Sodium: 140 mmol/L (ref 134–144)
eGFR: 93 mL/min/{1.73_m2} (ref >59)

## 2024-04-16 LAB — MICROALBUMIN / CREATININE URINE RATIO
Creatinine, Urine: 63.6 mg/dL
Microalb/Creat Ratio: <5 mg/g{creat} (ref 0–29)
Microalbumin, Urine: <3 ug/mL

## 2024-04-16 NOTE — Progress Notes (Signed)
 Your lab work is within acceptable range and there are no concerning findings.   ?

## 2024-04-16 NOTE — Telephone Encounter (Signed)
 Patient informed.

## 2024-04-16 NOTE — Telephone Encounter (Signed)
 Error below - patient's daughter Cathleen Coach informed.

## 2024-04-17 NOTE — Progress Notes (Signed)
 No excess protein in the urine.

## 2024-04-30 ENCOUNTER — Other Ambulatory Visit: Payer: Self-pay | Admitting: Family Medicine

## 2024-04-30 DIAGNOSIS — I10 Essential (primary) hypertension: Secondary | ICD-10-CM

## 2024-05-13 DIAGNOSIS — L814 Other melanin hyperpigmentation: Secondary | ICD-10-CM | POA: Diagnosis not present

## 2024-05-13 DIAGNOSIS — L918 Other hypertrophic disorders of the skin: Secondary | ICD-10-CM | POA: Diagnosis not present

## 2024-05-13 DIAGNOSIS — L209 Atopic dermatitis, unspecified: Secondary | ICD-10-CM | POA: Diagnosis not present

## 2024-05-13 DIAGNOSIS — D225 Melanocytic nevi of trunk: Secondary | ICD-10-CM | POA: Diagnosis not present

## 2024-05-27 DIAGNOSIS — L209 Atopic dermatitis, unspecified: Secondary | ICD-10-CM | POA: Diagnosis not present

## 2024-06-10 DIAGNOSIS — L209 Atopic dermatitis, unspecified: Secondary | ICD-10-CM | POA: Diagnosis not present

## 2024-06-13 DIAGNOSIS — R92323 Mammographic fibroglandular density, bilateral breasts: Secondary | ICD-10-CM | POA: Diagnosis not present

## 2024-06-13 DIAGNOSIS — Z1231 Encounter for screening mammogram for malignant neoplasm of breast: Secondary | ICD-10-CM | POA: Diagnosis not present

## 2024-06-20 DIAGNOSIS — H8111 Benign paroxysmal vertigo, right ear: Secondary | ICD-10-CM | POA: Diagnosis not present

## 2024-06-24 DIAGNOSIS — L209 Atopic dermatitis, unspecified: Secondary | ICD-10-CM | POA: Diagnosis not present

## 2024-07-02 ENCOUNTER — Encounter: Payer: Self-pay | Admitting: Sports Medicine

## 2024-07-08 DIAGNOSIS — L281 Prurigo nodularis: Secondary | ICD-10-CM | POA: Diagnosis not present

## 2024-07-08 DIAGNOSIS — L209 Atopic dermatitis, unspecified: Secondary | ICD-10-CM | POA: Diagnosis not present

## 2024-07-08 DIAGNOSIS — L570 Actinic keratosis: Secondary | ICD-10-CM | POA: Diagnosis not present

## 2024-07-13 DIAGNOSIS — Z23 Encounter for immunization: Secondary | ICD-10-CM | POA: Diagnosis not present

## 2024-07-23 DIAGNOSIS — L209 Atopic dermatitis, unspecified: Secondary | ICD-10-CM | POA: Diagnosis not present

## 2024-08-06 ENCOUNTER — Other Ambulatory Visit: Payer: Self-pay | Admitting: Family Medicine

## 2024-08-06 DIAGNOSIS — I10 Essential (primary) hypertension: Secondary | ICD-10-CM

## 2024-08-07 DIAGNOSIS — L570 Actinic keratosis: Secondary | ICD-10-CM | POA: Diagnosis not present

## 2024-08-07 DIAGNOSIS — L209 Atopic dermatitis, unspecified: Secondary | ICD-10-CM | POA: Diagnosis not present

## 2024-08-20 DIAGNOSIS — I708 Atherosclerosis of other arteries: Secondary | ICD-10-CM | POA: Diagnosis not present

## 2024-08-20 DIAGNOSIS — Z48812 Encounter for surgical aftercare following surgery on the circulatory system: Secondary | ICD-10-CM | POA: Diagnosis not present

## 2024-08-20 DIAGNOSIS — K551 Chronic vascular disorders of intestine: Secondary | ICD-10-CM | POA: Diagnosis not present

## 2024-08-22 DIAGNOSIS — L209 Atopic dermatitis, unspecified: Secondary | ICD-10-CM | POA: Diagnosis not present

## 2024-09-05 DIAGNOSIS — L209 Atopic dermatitis, unspecified: Secondary | ICD-10-CM | POA: Diagnosis not present

## 2024-09-13 LAB — HM MAMMOGRAPHY

## 2024-09-16 ENCOUNTER — Encounter: Payer: Self-pay | Admitting: Family Medicine

## 2024-09-16 ENCOUNTER — Ambulatory Visit (INDEPENDENT_AMBULATORY_CARE_PROVIDER_SITE_OTHER): Admitting: Family Medicine

## 2024-09-16 VITALS — BP 125/60 | HR 66 | Ht 62.0 in | Wt 131.9 lb

## 2024-09-16 DIAGNOSIS — E559 Vitamin D deficiency, unspecified: Secondary | ICD-10-CM | POA: Diagnosis not present

## 2024-09-16 DIAGNOSIS — G479 Sleep disorder, unspecified: Secondary | ICD-10-CM

## 2024-09-16 DIAGNOSIS — E119 Type 2 diabetes mellitus without complications: Secondary | ICD-10-CM | POA: Diagnosis not present

## 2024-09-16 DIAGNOSIS — I10 Essential (primary) hypertension: Secondary | ICD-10-CM | POA: Diagnosis not present

## 2024-09-16 DIAGNOSIS — F3341 Major depressive disorder, recurrent, in partial remission: Secondary | ICD-10-CM | POA: Diagnosis not present

## 2024-09-16 DIAGNOSIS — F5101 Primary insomnia: Secondary | ICD-10-CM | POA: Diagnosis not present

## 2024-09-16 LAB — POCT GLYCOSYLATED HEMOGLOBIN (HGB A1C): Hemoglobin A1C: 5.4 % (ref 4.0–5.6)

## 2024-09-16 MED ORDER — ESZOPICLONE 3 MG PO TABS: 3.0000 mg | ORAL_TABLET | Freq: Every day | ORAL | 2 refills | Status: AC

## 2024-09-16 NOTE — Assessment & Plan Note (Signed)
 Doing well on lexapro , stressors of husband's health problems.

## 2024-09-16 NOTE — Progress Notes (Signed)
 Established Patient Office Visit  Patient ID: Jasmine Fitzgerald, female    DOB: [birth year]  Age: 78 y.o. MRN: [id number] PCP: Alvan Dorothyann BIRCH, MD  Chief Complaint  Patient presents with   Diabetes    Subjective:     HPI  Discussed the use of AI scribe software for clinical note transcription with the patient, who gave verbal consent to proceed.  History of Present Illness Jasmine Fitzgerald is a 78 year old female who presents with sleep disturbances and medication management issues.  Sleep disturbance - Persistent difficulty initiating sleep despite use of Ambien  5 mg - Ambien  occasionally ineffective, resulting in wakefulness until 1:00-1:30 AM despite going to bed at 10:00 PM - Intermittent use of melatonin spray for sleep aid - No excessive caffeine intake; consumes one cup of coffee in the morning  Psychological stress and medication adherence - Increased psychological stress due to caregiving responsibilities for husband with short-term memory impairment and physical limitations - Increased physical demands at home secondary to husband's inability to assist with household tasks - Lexapro  provides calming effect but is not taken daily as prescribed  Antihypertensive medication adherence - Blood pressure medication not taken daily  Pruritus management - Receives Dupixent injections for pruritus with effective symptom control - Cost of Dupixent is a concern; receives samples from dermatologist  Weight loss and nutritional status - Initiated weight loss regimen with injections since February 14th - Achieved 32-pound weight loss, current weight 131 pounds - Improved sense of well-being and reduction in clothing size from 14 to 10 - Goal weight is 130 pounds     ROS    Objective:     BP 125/60   Pulse 66   Ht 5' 2 (1.575 m)   Wt 131 lb 14.4 oz (59.8 kg)   SpO2 100%   BMI 24.12 kg/m    Physical Exam Vitals and nursing note reviewed.  Constitutional:       Appearance: Normal appearance.  HENT:     Head: Normocephalic and atraumatic.  Eyes:     Conjunctiva/sclera: Conjunctivae normal.  Cardiovascular:     Rate and Rhythm: Normal rate and regular rhythm.  Pulmonary:     Effort: Pulmonary effort is normal.     Breath sounds: Normal breath sounds.  Skin:    General: Skin is warm and dry.  Neurological:     Mental Status: She is alert.  Psychiatric:        Mood and Affect: Mood normal.      Results for orders placed or performed in visit on 09/16/24  POCT HgB A1C  Result Value Ref Range   Hemoglobin A1C 5.4 4.0 - 5.6 %   HbA1c POC (<> result, manual entry)     HbA1c, POC (prediabetic range)     HbA1c, POC (controlled diabetic range)        The 10-year ASCVD risk score (Arnett DK, et al., 2019) is: 43.4%    Assessment & Plan:   Problem List Items Addressed This Visit       Cardiovascular and Mediastinum   Essential hypertension, benign - Primary   Well controlled. Continue current regimen. Follow up in  6 mo       Relevant Orders   POCT HgB A1C (Completed)   CMP14+EGFR   Lipid panel   CBC     Endocrine   Controlled type 2 diabetes mellitus without complication, without long-term current use of insulin (HCC)   Relevant Orders   POCT  HgB A1C (Completed)   CMP14+EGFR   Lipid panel   CBC     Other   Vitamin D  deficiency   Relevant Orders   VITAMIN D  25 Hydroxy (Vit-D Deficiency, Fractures)   Primary insomnia   Relevant Medications   Eszopiclone 3 MG TABS   MDD (major depressive disorder), recurrent, in partial remission   Doing well on lexapro , stressors of husband's health problems.       Disturbance in sleep behavior    Assessment and Plan Assessment & Plan Adult Wellness Visit Overall well-being is good with a supportive family environment. Weight loss achieved with medication and lifestyle changes. - Continue current lifestyle and weight management strategies.  Essential hypertension Blood  pressure management is inconsistent due to non-adherence to medication. - Encouraged daily blood pressure monitoring and adherence to antihypertensive medication regimen.  Type 2 diabetes mellitus without complications A1c is well-controlled at 5.4%. - Continue current diabetes management plan.  Insomnia Chronic insomnia with tolerance to Ambien . Discussed switch to Lunesta, noting possible metallic taste. - Prescribed Lunesta as an alternative to Ambien . - Advised on potential side effects of Lunesta, including metallic taste. - Continue current sleep hygiene practices.  Chronic pruritus Managed with Dupixent injections, effective but costly. Samples available. - Continue Dupixent injections as needed. - Ensure Dupixent is listed in medication list for potential interactions.  Depressive disorder Managed with Lexapro , but adherence is inconsistent. - Encouraged consistent daily use of Lexapro .    Return in about 6 months (around 03/16/2025) for Diabetes follow-up, Hypertension.    Dorothyann Byars, MD [facility] at [facility]

## 2024-09-16 NOTE — Assessment & Plan Note (Signed)
 Well controlled. Continue current regimen. Follow up in  6 mo

## 2024-09-17 ENCOUNTER — Ambulatory Visit: Payer: Self-pay | Admitting: Family Medicine

## 2024-09-17 LAB — LIPID PANEL
Chol/HDL Ratio: 2.4 ratio (ref 0.0–4.4)
Cholesterol, Total: 175 mg/dL (ref 100–199)
HDL: 73 mg/dL (ref >39)
LDL Chol Calc (NIH): 91 mg/dL (ref 0–99)
Triglycerides: 58 mg/dL (ref 0–149)
VLDL Cholesterol Cal: 11 mg/dL (ref 5–40)

## 2024-09-17 LAB — CMP14+EGFR
ALT: 8 IU/L (ref 0–32)
AST: 15 IU/L (ref 0–40)
Albumin: 4.4 g/dL (ref 3.8–4.8)
Alkaline Phosphatase: 36 IU/L — ABNORMAL LOW (ref 49–135)
BUN/Creatinine Ratio: 24 (ref 12–28)
BUN: 14 mg/dL (ref 8–27)
Bilirubin Total: 0.6 mg/dL (ref 0.0–1.2)
CO2: 24 mmol/L (ref 20–29)
Calcium: 9.4 mg/dL (ref 8.7–10.3)
Chloride: 102 mmol/L (ref 96–106)
Creatinine, Ser: 0.58 mg/dL (ref 0.57–1.00)
Globulin, Total: 2.4 g/dL (ref 1.5–4.5)
Glucose: 76 mg/dL (ref 70–99)
Potassium: 4.3 mmol/L (ref 3.5–5.2)
Sodium: 141 mmol/L (ref 134–144)
Total Protein: 6.8 g/dL (ref 6.0–8.5)
eGFR: 93 mL/min/1.73 (ref >59)

## 2024-09-17 LAB — CBC
Hematocrit: 40.1 % (ref 34.0–46.6)
Hemoglobin: 12.7 g/dL (ref 11.1–15.9)
MCH: 31.1 pg (ref 26.6–33.0)
MCHC: 31.7 g/dL (ref 31.5–35.7)
MCV: 98 fL — ABNORMAL HIGH (ref 79–97)
Platelets: 216 x10E3/uL (ref 150–450)
RBC: 4.09 x10E6/uL (ref 3.77–5.28)
RDW: 12.2 % (ref 11.7–15.4)
WBC: 6.4 x10E3/uL (ref 3.4–10.8)

## 2024-09-17 LAB — VITAMIN D 25 HYDROXY (VIT D DEFICIENCY, FRACTURES): Vit D, 25-Hydroxy: 37 ng/mL (ref 30.0–100.0)

## 2024-09-17 NOTE — Progress Notes (Signed)
 Hi Jasmine Fitzgerald, metabolic panel overall looks good.  Blood count is normal.  Cholesterol looks great this time.  Vitamin D  is still on the low end make sure you are still taking a vitamin D  supplement.  Preferably 25 or 50 mcg daily.

## 2024-09-18 NOTE — Progress Notes (Signed)
 Okay, lets have her bump up to 2 tabs daily through the winter.

## 2024-09-26 ENCOUNTER — Other Ambulatory Visit: Payer: Self-pay | Admitting: Family Medicine

## 2024-09-26 DIAGNOSIS — F418 Other specified anxiety disorders: Secondary | ICD-10-CM

## 2024-10-09 DIAGNOSIS — L209 Atopic dermatitis, unspecified: Secondary | ICD-10-CM | POA: Diagnosis not present

## 2024-10-09 DIAGNOSIS — L570 Actinic keratosis: Secondary | ICD-10-CM | POA: Diagnosis not present

## 2024-10-09 DIAGNOSIS — L281 Prurigo nodularis: Secondary | ICD-10-CM | POA: Diagnosis not present

## 2024-10-10 ENCOUNTER — Other Ambulatory Visit: Payer: Self-pay | Admitting: Family Medicine

## 2024-10-10 DIAGNOSIS — G479 Sleep disorder, unspecified: Secondary | ICD-10-CM

## 2024-10-10 DIAGNOSIS — F5101 Primary insomnia: Secondary | ICD-10-CM

## 2025-03-17 ENCOUNTER — Ambulatory Visit: Admitting: Family Medicine
